# Patient Record
Sex: Male | Born: 1980 | Race: Black or African American | Hispanic: No | Marital: Married | Smoking: Current every day smoker
Health system: Southern US, Community
[De-identification: ages and names within clinical notes are randomized; demographics above are authoritative.]

## PROBLEM LIST (undated history)

## (undated) ENCOUNTER — Ambulatory Visit: Admission: EM | Source: Home / Self Care

## (undated) DIAGNOSIS — F419 Anxiety disorder, unspecified: Secondary | ICD-10-CM

## (undated) DIAGNOSIS — I10 Essential (primary) hypertension: Secondary | ICD-10-CM

## (undated) DIAGNOSIS — F4310 Post-traumatic stress disorder, unspecified: Secondary | ICD-10-CM

## (undated) DIAGNOSIS — D649 Anemia, unspecified: Secondary | ICD-10-CM

## (undated) DIAGNOSIS — F329 Major depressive disorder, single episode, unspecified: Secondary | ICD-10-CM

## (undated) DIAGNOSIS — M1990 Unspecified osteoarthritis, unspecified site: Secondary | ICD-10-CM

## (undated) DIAGNOSIS — K769 Liver disease, unspecified: Secondary | ICD-10-CM

## (undated) DIAGNOSIS — F32A Depression, unspecified: Secondary | ICD-10-CM

## (undated) HISTORY — DX: Anemia, unspecified: D64.9

## (undated) HISTORY — DX: Unspecified osteoarthritis, unspecified site: M19.90

## (undated) HISTORY — PX: WISDOM TOOTH EXTRACTION: SHX21

## (undated) HISTORY — DX: Essential (primary) hypertension: I10

---
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
## 2009-12-01 HISTORY — PX: ANKLE SURGERY: SHX546

## 2011-12-02 HISTORY — PX: SPINE SURGERY: SHX786

## 2013-12-01 DIAGNOSIS — I219 Acute myocardial infarction, unspecified: Secondary | ICD-10-CM

## 2013-12-01 HISTORY — DX: Acute myocardial infarction, unspecified: I21.9

## 2017-12-01 DIAGNOSIS — K5190 Ulcerative colitis, unspecified, without complications: Secondary | ICD-10-CM

## 2017-12-01 HISTORY — DX: Ulcerative colitis, unspecified, without complications: K51.90

## 2018-09-14 DIAGNOSIS — M79605 Pain in left leg: Secondary | ICD-10-CM | POA: Diagnosis not present

## 2018-09-14 DIAGNOSIS — S8392XA Sprain of unspecified site of left knee, initial encounter: Secondary | ICD-10-CM | POA: Diagnosis not present

## 2018-09-17 DIAGNOSIS — M25562 Pain in left knee: Secondary | ICD-10-CM | POA: Diagnosis not present

## 2018-09-20 DIAGNOSIS — S83232A Complex tear of medial meniscus, current injury, left knee, initial encounter: Secondary | ICD-10-CM | POA: Diagnosis not present

## 2018-10-08 DIAGNOSIS — S83242A Other tear of medial meniscus, current injury, left knee, initial encounter: Secondary | ICD-10-CM | POA: Diagnosis not present

## 2018-12-01 HISTORY — PX: KNEE SURGERY: SHX244

## 2018-12-20 DIAGNOSIS — S83242A Other tear of medial meniscus, current injury, left knee, initial encounter: Secondary | ICD-10-CM | POA: Diagnosis not present

## 2018-12-20 DIAGNOSIS — M948X6 Other specified disorders of cartilage, lower leg: Secondary | ICD-10-CM | POA: Diagnosis not present

## 2018-12-20 DIAGNOSIS — M94262 Chondromalacia, left knee: Secondary | ICD-10-CM | POA: Diagnosis not present

## 2019-01-24 DIAGNOSIS — S83242D Other tear of medial meniscus, current injury, left knee, subsequent encounter: Secondary | ICD-10-CM | POA: Diagnosis not present

## 2019-01-24 DIAGNOSIS — M25562 Pain in left knee: Secondary | ICD-10-CM | POA: Diagnosis not present

## 2019-01-24 DIAGNOSIS — M6281 Muscle weakness (generalized): Secondary | ICD-10-CM | POA: Diagnosis not present

## 2019-01-24 DIAGNOSIS — R269 Unspecified abnormalities of gait and mobility: Secondary | ICD-10-CM | POA: Diagnosis not present

## 2019-02-02 DIAGNOSIS — M25562 Pain in left knee: Secondary | ICD-10-CM | POA: Diagnosis not present

## 2019-09-09 DIAGNOSIS — M7051 Other bursitis of knee, right knee: Secondary | ICD-10-CM | POA: Diagnosis not present

## 2019-09-09 DIAGNOSIS — S6721XA Crushing injury of right hand, initial encounter: Secondary | ICD-10-CM | POA: Diagnosis not present

## 2019-11-21 DIAGNOSIS — M25551 Pain in right hip: Secondary | ICD-10-CM | POA: Diagnosis not present

## 2019-11-21 DIAGNOSIS — M5416 Radiculopathy, lumbar region: Secondary | ICD-10-CM | POA: Diagnosis not present

## 2019-11-21 DIAGNOSIS — F17200 Nicotine dependence, unspecified, uncomplicated: Secondary | ICD-10-CM | POA: Diagnosis not present

## 2019-11-21 DIAGNOSIS — I252 Old myocardial infarction: Secondary | ICD-10-CM | POA: Diagnosis not present

## 2019-11-21 DIAGNOSIS — M48061 Spinal stenosis, lumbar region without neurogenic claudication: Secondary | ICD-10-CM | POA: Diagnosis not present

## 2019-11-28 DIAGNOSIS — Z6832 Body mass index (BMI) 32.0-32.9, adult: Secondary | ICD-10-CM | POA: Diagnosis not present

## 2019-11-28 DIAGNOSIS — R112 Nausea with vomiting, unspecified: Secondary | ICD-10-CM | POA: Diagnosis not present

## 2019-11-28 DIAGNOSIS — R197 Diarrhea, unspecified: Secondary | ICD-10-CM | POA: Diagnosis not present

## 2019-11-28 DIAGNOSIS — K529 Noninfective gastroenteritis and colitis, unspecified: Secondary | ICD-10-CM | POA: Diagnosis not present

## 2020-04-13 ENCOUNTER — Emergency Department (HOSPITAL_COMMUNITY)
Admission: EM | Admit: 2020-04-13 | Discharge: 2020-04-13 | Disposition: A | Discharge status: Home / Self Care | Payer: BC Managed Care – PPO | Attending: Emergency Medicine | Admitting: Emergency Medicine

## 2020-04-13 ENCOUNTER — Other Ambulatory Visit: Payer: Self-pay

## 2020-04-13 ENCOUNTER — Encounter (HOSPITAL_COMMUNITY): Payer: Self-pay | Admitting: Emergency Medicine

## 2020-04-13 DIAGNOSIS — Z5321 Procedure and treatment not carried out due to patient leaving prior to being seen by health care provider: Secondary | ICD-10-CM | POA: Insufficient documentation

## 2020-04-13 DIAGNOSIS — R0789 Other chest pain: Secondary | ICD-10-CM | POA: Diagnosis not present

## 2020-04-13 DIAGNOSIS — R079 Chest pain, unspecified: Secondary | ICD-10-CM | POA: Diagnosis not present

## 2020-04-13 DIAGNOSIS — R064 Hyperventilation: Secondary | ICD-10-CM | POA: Diagnosis not present

## 2020-04-13 DIAGNOSIS — F419 Anxiety disorder, unspecified: Secondary | ICD-10-CM | POA: Insufficient documentation

## 2020-04-13 DIAGNOSIS — R0689 Other abnormalities of breathing: Secondary | ICD-10-CM | POA: Diagnosis not present

## 2020-04-13 HISTORY — DX: Anxiety disorder, unspecified: F41.9

## 2020-04-13 HISTORY — DX: Post-traumatic stress disorder, unspecified: F43.10

## 2020-04-13 HISTORY — DX: Depression, unspecified: F32.A

## 2020-04-13 NOTE — ED Triage Notes (Addendum)
Patient arrived with EMS from home reports anxiety attack this morning , agitated /hyperventilating when EMS arrived at his home , he received Haldol 5 mg IM by EMS with relief . Patient stated he has not taken his antianxiety medication for several months .

## 2020-04-13 NOTE — ED Notes (Signed)
Called pt name x4 for VS, no response from pt.

## 2020-05-30 ENCOUNTER — Ambulatory Visit: Payer: BC Managed Care – PPO | Admitting: Physician Assistant

## 2020-05-30 ENCOUNTER — Encounter: Payer: Self-pay | Admitting: Physician Assistant

## 2020-05-30 ENCOUNTER — Other Ambulatory Visit: Payer: Self-pay

## 2020-05-30 VITALS — BP 130/80 | HR 67 | Temp 98.4°F | Ht 68.5 in | Wt 219.0 lb

## 2020-05-30 DIAGNOSIS — M25562 Pain in left knee: Secondary | ICD-10-CM

## 2020-05-30 DIAGNOSIS — I10 Essential (primary) hypertension: Secondary | ICD-10-CM | POA: Insufficient documentation

## 2020-05-30 DIAGNOSIS — G8929 Other chronic pain: Secondary | ICD-10-CM

## 2020-05-30 DIAGNOSIS — M25561 Pain in right knee: Secondary | ICD-10-CM

## 2020-05-30 DIAGNOSIS — F331 Major depressive disorder, recurrent, moderate: Secondary | ICD-10-CM

## 2020-05-30 DIAGNOSIS — K51911 Ulcerative colitis, unspecified with rectal bleeding: Secondary | ICD-10-CM

## 2020-05-30 DIAGNOSIS — F419 Anxiety disorder, unspecified: Secondary | ICD-10-CM

## 2020-05-30 DIAGNOSIS — G629 Polyneuropathy, unspecified: Secondary | ICD-10-CM

## 2020-05-30 DIAGNOSIS — I219 Acute myocardial infarction, unspecified: Secondary | ICD-10-CM

## 2020-05-30 DIAGNOSIS — F32A Depression, unspecified: Secondary | ICD-10-CM | POA: Insufficient documentation

## 2020-05-30 DIAGNOSIS — F514 Sleep terrors [night terrors]: Secondary | ICD-10-CM

## 2020-05-30 DIAGNOSIS — F4310 Post-traumatic stress disorder, unspecified: Secondary | ICD-10-CM | POA: Insufficient documentation

## 2020-05-30 MED ORDER — DIAZEPAM 5 MG PO TABS: 5.0000 mg | ORAL_TABLET | Freq: Every evening | ORAL | 2 refills | Status: DC

## 2020-05-30 MED ORDER — CELECOXIB 200 MG PO CAPS: 200.0000 mg | ORAL_CAPSULE | Freq: Two times a day (BID) | ORAL | 2 refills | Status: AC

## 2020-05-30 MED ORDER — MESALAMINE ER 0.375 G PO CP24: 375.0000 mg | ORAL_CAPSULE | Freq: Four times a day (QID) | ORAL | 3 refills | Status: DC

## 2020-05-30 MED ORDER — GABAPENTIN 300 MG PO CAPS: 300.0000 mg | ORAL_CAPSULE | Freq: Three times a day (TID) | ORAL | 2 refills | Status: DC

## 2020-05-30 MED ORDER — VORTIOXETINE HBR 10 MG PO TABS: 5.0000 mg | ORAL_TABLET | Freq: Every day | ORAL | 0 refills | Status: DC

## 2020-05-30 MED ORDER — PRAZOSIN HCL 1 MG PO CAPS: 2.0000 mg | ORAL_CAPSULE | Freq: Every day | ORAL | 1 refills | Status: DC

## 2020-05-30 MED ORDER — VORTIOXETINE HBR 5 MG PO TABS: 5.0000 mg | ORAL_TABLET | Freq: Every day | ORAL | 0 refills | Status: DC

## 2020-05-30 NOTE — Patient Instructions (Signed)
It was great to see you!  Let's start Trintellix -- sample has been placed. I have the psychiatry referral in, however if you'd like to take Viibryd instead, please let me know and I will send that in while you wait for your appointment.  Medications have been refilled.  You will be contacted about your referrals to: Gastroenterology Cardiology Psychiatry Sports Medicine  Let's follow-up in 3 months if you will still be getting Valium from me (can be in 6 months if you are seeing psych), sooner if you have concerns.  Take care,  Inda Coke PA-C

## 2020-05-30 NOTE — Progress Notes (Signed)
Daniel Marsh is a 39 y.o. male is here to establish care.  I acted as a Education administrator for [employer], PA-C Anselmo Pickler, LPN   History of Present Illness:   Chief Complaint  Patient presents with  . Establish Care  . Knee Pain    HPI   Pt here to establish care today. He and his wife recently moved from [place], [place].  Knee pain Pt having bilateral knee pain. R > L., right knee is buckling and feels like it is sliding out of place. Pt not taken anything for pain. Was supposed to have surgery but he states that while he was on the operating table, the surgeon decided to not proceed with surgery due to the anatomy of his knees. States he was going to have his meniscus repaired. Has had two rounds of steroid injections. He takes celebrex 200 mg BID for pain.  MI; HTN Was in a very stressful lifestyle and had MI. Went to cardiology for a few years afterwards. Was on a beta blocker (?rx or dose) and micardis-hctz (?dose) but he took himself off of these. He is interested in resuming care with a cardiologist. Patient denies chest pain, SOB, blurred vision, dizziness, unusual headaches, lower leg swelling.   BP Readings from Last 3 Encounters:  05/30/20 130/80  04/13/20 140/78   Ulcerative colitis Last colonoscopy in 2020? Most recent severe flare in 2019 - had bloody diarrhea that took him to the ER. He reports that he takes mesalamine 0.375 mg QID. He would like to establish with a gastroenterologist.  Anxiety and Depression; PTSD; Night Terrors Significant history of this. Was seeing a psychiatrist prior to moving. He contineus to see a therapist from the [employer]. He is currently prescribed Viibryd 40 mg daily, Minipress 2 mg nightly, Valium 5 mg daily. He has been out of his Viibryd for a while. He states that he has tried multiple medications including: cymbalta, lexapro, zoloft, and several others. He has not tried Effexor or Trintellix but has family members that have done  well with these medications. He is currently not suicidal. Viibrid has worked well for him but can be numbing and has decreased his libido. Takes minipress 2-3 times a week for night terrors.  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 He is prescribed Gabapentin 300 mg TID for this. He tolerates this without issue.   Health Maintenance Due  Topic Date Due  . Hepatitis C Screening  Never done  . COVID-19 Vaccine (1) Never done  . HIV Screening  Never done    Past Medical History:  Diagnosis Date  . Anxiety   . Arthritis   . Depression   . Hypertension   . Myocardial infarction ([place]) 2015  . PTSD (post-traumatic stress disorder)   . Ulcerative colitis ([place]) 2019     Social History   Tobacco Use  . Smoking status: Current Every Day Smoker    Types: Cigarettes, Cigars  . Smokeless tobacco: Never Used  Vaping Use  . Vaping Use: Never used  Substance Use Topics  . Alcohol use: Yes    Alcohol/week: 13.0 standard drinks    Types: 10 Cans of beer, 3 Shots of liquor per week  . Drug use: Yes    Types: Marijuana    Past Surgical History:  Procedure Laterality Date  . ANKLE SURGERY Right 2011  . KNEE SURGERY Left 2020  . SPINE SURGERY  2013   Lumbar fusion  L4-S1    Family History  Problem Relation Age of Onset  . Osteoarthritis Mother   . Depression Mother   . Heart attack Mother   . Osteoarthritis Father   . Depression Father   . Depression Brother   . Diabetes Maternal Grandmother   . Hyperlipidemia Maternal Grandmother   . Hypertension Maternal Grandmother   . Heart attack Maternal Grandmother   . Osteoarthritis Maternal Grandfather   . COPD Maternal Grandfather   . Kidney disease Maternal Grandfather   . Renal cancer Maternal Grandfather   . Alcohol abuse Paternal Grandmother   . COPD Paternal Grandmother   . Diabetes Paternal Grandmother   . Hypertension Paternal Grandmother   . Lung cancer Paternal Grandmother    . Osteoarthritis Paternal Grandfather   . Heart attack Paternal Grandfather   . Heart failure Paternal Grandfather   . Depression Brother   . Pancreatic cancer Maternal Aunt   . Lung cancer Paternal Aunt     PMHx, SurgHx, SocialHx, FamHx, Medications, and Allergies were reviewed in the Visit Navigator and updated as appropriate.   Patient Active Problem List   Diagnosis Date Noted  . PTSD (post-traumatic stress disorder)   . Hypertension   . Depression   . Anxiety   . Ulcerative colitis ([place]) 2019  . Myocardial infarction [facility]) 2015    Social History   Tobacco Use  . Smoking status: Current Every Day Smoker    Types: Cigarettes, Cigars  . Smokeless tobacco: Never Used  Vaping Use  . Vaping Use: Never used  Substance Use Topics  . Alcohol use: Yes    Alcohol/week: 13.0 standard drinks    Types: 10 Cans of beer, 3 Shots of liquor per week  . Drug use: Yes    Types: Marijuana    Current Medications and Allergies:    Current Outpatient Medications:  .  celecoxib (CELEBREX) 200 MG capsule, Take 1 capsule (200 mg total) by mouth 2 (two) times daily., Disp: 180 capsule, Rfl: 2 .  diazepam (VALIUM) 5 MG tablet, Take 1 tablet (5 mg total) by mouth at bedtime., Disp: 30 tablet, Rfl: 2 .  gabapentin (NEURONTIN) 300 MG capsule, Take 1 capsule (300 mg total) by mouth 3 (three) times daily., Disp: 270 capsule, Rfl: 2 .  mesalamine (APRISO) 0.375 g 24 hr capsule, Take 1 capsule (0.375 g total) by mouth in the morning, at noon, in the evening, and at bedtime., Disp: 360 capsule, Rfl: 3 .  prazosin (MINIPRESS) 1 MG capsule, Take 2 capsules (2 mg total) by mouth at bedtime., Disp: 180 capsule, Rfl: 1 .  Vilazodone HCl (VIIBRYD) 40 MG TABS, Take by mouth., Disp: , Rfl:  .  vortioxetine HBr (TRINTELLIX) 10 MG TABS tablet, Take 0.5 tablets (5 mg total) by mouth daily., Disp: 7 tablet, Rfl: 0 .  vortioxetine HBr (TRINTELLIX) 5 MG TABS tablet, Take 1 tablet (5 mg total) by mouth daily.,  Disp: 14 tablet, Rfl: 0  No Known Allergies  Review of Systems   ROS  Negative unless otherwise specified per HPI.  Vitals:   Vitals:   05/30/20 1357  BP: 130/80  Pulse: 67  Temp: 98.4 F (36.9 C)  TempSrc: Temporal  SpO2: 98%  Weight: 219 lb (99.3 kg)  Height: 5' 8.5" (1.74 m)     Body mass index is 32.81 kg/m.   Physical Exam:    Physical Exam Vitals and nursing note reviewed.  Constitutional:      General: He is not in  acute distress.    Appearance: He is well-developed. He is not ill-appearing or toxic-appearing.  Cardiovascular:     Rate and Rhythm: Normal rate and regular rhythm.     Pulses: Normal pulses.     Heart sounds: Normal heart sounds, S1 normal and S2 normal.     Comments: No LE edema Pulmonary:     Effort: Pulmonary effort is normal.     Breath sounds: Normal breath sounds.  Musculoskeletal:     Comments: R knee -- TTP to lateral R knee L knee -- swelling to medial lower L knee  Skin:    General: Skin is warm and dry.  Neurological:     Mental Status: He is alert.     GCS: GCS eye subscore is 4. GCS verbal subscore is 5. GCS motor subscore is 6.  Psychiatric:        Speech: Speech normal.        Behavior: Behavior normal. Behavior is cooperative.      Assessment and Plan:    Daniel was seen today for establish care and knee pain.  Diagnoses and all orders for this visit:  Chronic pain of both knees Referral to sports medicine.  Myocardial infarction, unspecified MI type, unspecified artery ([place]);  Essential hypertension BP currently controlled and patient is currently asymptomatic. Will refer to cardiology given hx of MI and need for routine care.  Ulcerative colitis with rectal bleeding, unspecified location [facility]) Referral to GI. Did provide refill of his mesalamine today.  PTSD (post-traumatic stress disorder); Moderate episode of recurrent major depressive disorder ([place]); Anxiety; Night terrors Uncontrolled. I discussed  with patient that if they develop any SI, to tell someone immediately and seek medical attention. We decided to continue to hold Viibryd and start 5 mg Trintellix. This was given to him in the office today -- provided 1 months worth. Will refer to psychiatry for further management. Did discuss that if he would like to go back on Viibryd instead prior to psychiatry appointment I would be happy to switch him back to this.   I have also given him courtesy refills of Minipress and Valium. [place] Controlled Substance Database reviewed today regarding patient. Patient is compliant with [place] regarding pharmacy use and one-prescribing provider. It is appropriate to continue current medication regimen. Discussed pros and cons of controlled substances and expectations to receive prescription from our office. Patient is not to abuse, misuse, divert or use medication other than as prescribed. Patient is not to seek controlled substances from other clinics or multiple pharmacies. Patient is not to use illegal drugs. Discussed recommended short term use of med. Discussed risks of medication including dependence, tolerance, and addiction/abuse potential. Patient will establish or update controlled substance agreement as appropriate.  Neuropathy Well controlled. Refill celebrex and Neurontin.  Other orders -     celecoxib (CELEBREX) 200 MG capsule; Take 1 capsule (200 mg total) by mouth 2 (two) times daily. -     diazepam (VALIUM) 5 MG tablet; Take 1 tablet (5 mg total) by mouth at bedtime. -     gabapentin (NEURONTIN) 300 MG capsule; Take 1 capsule (300 mg total) by mouth 3 (three) times daily. -     mesalamine (APRISO) 0.375 g 24 hr capsule; Take 1 capsule (0.375 g total) by mouth in the morning, at noon, in the evening, and at bedtime. -     prazosin (MINIPRESS) 1 MG capsule; Take 2 capsules (2 mg total) by mouth at bedtime. -  vortioxetine HBr (TRINTELLIX) 5 MG TABS tablet; Take 1 tablet (5 mg total) by mouth  daily. -     vortioxetine HBr (TRINTELLIX) 10 MG TABS tablet; Take 0.5 tablets (5 mg total) by mouth daily.  Reviewed expectations re: course of current medical issues. Discussed self-management of symptoms. Outlined signs and symptoms indicating need for more acute intervention. Patient verbalized understanding and all questions were answered. See orders for this visit as documented in the electronic medical record. Patient received an After Visit Summary.  CMA or LPN served as scribe during this visit. History, Physical, and Plan performed by medical provider. The above documentation has been reviewed and is accurate and complete.  I spent >45 minutes with this patient, greater than 50% was face-to-face time counseling regarding the above diagnoses.   Inda Coke, PA-C [place] [place] 05/30/2020  Follow-up: No follow-ups on file.

## 2020-06-01 ENCOUNTER — Encounter: Payer: Self-pay | Admitting: Cardiovascular Disease

## 2020-06-01 ENCOUNTER — Ambulatory Visit: Payer: BC Managed Care – PPO | Admitting: Physician Assistant

## 2020-06-01 ENCOUNTER — Other Ambulatory Visit: Payer: Self-pay

## 2020-06-01 ENCOUNTER — Ambulatory Visit: Payer: BC Managed Care – PPO | Admitting: Cardiovascular Disease

## 2020-06-01 VITALS — BP 160/92 | HR 72 | Ht 71.5 in | Wt 214.0 lb

## 2020-06-01 DIAGNOSIS — I10 Essential (primary) hypertension: Secondary | ICD-10-CM | POA: Diagnosis not present

## 2020-06-01 DIAGNOSIS — E782 Mixed hyperlipidemia: Secondary | ICD-10-CM

## 2020-06-01 DIAGNOSIS — E785 Hyperlipidemia, unspecified: Secondary | ICD-10-CM | POA: Insufficient documentation

## 2020-06-01 DIAGNOSIS — R072 Precordial pain: Secondary | ICD-10-CM | POA: Diagnosis not present

## 2020-06-01 DIAGNOSIS — I219 Acute myocardial infarction, unspecified: Secondary | ICD-10-CM

## 2020-06-01 LAB — LIPID PANEL
Chol/HDL Ratio: 2.5 ratio (ref 0.0–5.0)
Cholesterol, Total: 211 mg/dL — ABNORMAL HIGH (ref 100–199)
HDL: 85 mg/dL (ref >39)
LDL Chol Calc (NIH): 114 mg/dL — ABNORMAL HIGH (ref 0–99)
Triglycerides: 66 mg/dL (ref 0–149)
VLDL Cholesterol Cal: 12 mg/dL (ref 5–40)

## 2020-06-01 LAB — HEPATIC FUNCTION PANEL
ALT: 11 IU/L (ref 0–44)
AST: 18 IU/L (ref 0–40)
Albumin: 4.8 g/dL (ref 4.0–5.0)
Alkaline Phosphatase: 63 IU/L (ref 48–121)
Bilirubin Total: 0.8 mg/dL (ref 0.0–1.2)
Bilirubin, Direct: 0.22 mg/dL (ref 0.00–0.40)
Total Protein: 7 g/dL (ref 6.0–8.5)

## 2020-06-01 MED ORDER — METOPROLOL TARTRATE 100 MG PO TABS
ORAL_TABLET | ORAL | 0 refills | Status: DC
Start: 2020-06-01 — End: 2020-07-09

## 2020-06-01 NOTE — Assessment & Plan Note (Addendum)
History of essential hypertension in the past on Micardis currently not on any antihypertensive medications.  His blood pressure in [facility] office 3 days ago was 130/80.  Today it is 160/92 initially which falling to 136/86 at the end of the interview.

## 2020-06-01 NOTE — Assessment & Plan Note (Signed)
History of hyperlipidemia on simvastatin in the past not currently taking that medication.  We will check a lipid liver profile today.

## 2020-06-01 NOTE — Patient Instructions (Signed)
Medication Instructions:  NO CHANGE *If you need a refill on your cardiac medications before your next appointment, please call your pharmacy*   Lab Work: Your physician recommends that you HAVE LAB WORK TODAY If you have labs (blood work) drawn today and your tests are completely normal, you will receive your results only by:  [place] (if you have MyChart) OR  A paper copy in the mail If you have any lab test that is abnormal or we need to change your treatment, we will call you to review the results.   Testing/Procedures:  Your physician has requested that you have an echocardiogram. Echocardiography is a painless test that uses sound waves to create images of your heart. It provides your doctor with information about the size and shape of your heart and how well your hearts chambers and valves are working. This procedure takes approximately one hour. There are no restrictions for this procedure.[place]  Your cardiac CT will be scheduled at one of the below locations:   [facility] [address] [place], [place] [address] [phone]  If scheduled at [facility], please arrive at the [facility] main entrance of [facility] 30 minutes prior to test start time. Proceed to the [facility] Radiology Department (first floor) to check-in and test prep.  Please follow these instructions carefully (unless otherwise directed):  Hold all erectile dysfunction medications at least 3 days (72 hrs) prior to test.  On the Night Before the Test:  Be sure to Drink plenty of water.  Do not consume any caffeinated/decaffeinated beverages or chocolate 12 hours prior to your test.  Do not take any antihistamines 12 hours prior to your test.  On the Day of the Test:  Drink plenty of water. Do not drink any water within one hour of the test.  Do not eat any food 4 hours prior to the test.  You may take your regular medications prior  to the test.   Take metoprolol (Lopressor) 100 MG two hours prior to test.  HOLD Furosemide/Hydrochlorothiazide morning of the test.  After the Test:  Drink plenty of water.  After receiving IV contrast, you may experience a mild flushed feeling. This is normal.  On occasion, you may experience a mild rash up to 24 hours after the test. This is not dangerous. If this occurs, you can take Benadryl 25 mg and increase your fluid intake.  If you experience trouble breathing, this can be serious. If it is severe call 911 IMMEDIATELY. If it is mild, please call our office.  If you take any of these medications: Glipizide/Metformin, Avandament, Glucavance, please do not take 48 hours after completing test unless otherwise instructed.   Once we have confirmed authorization from your insurance company, we will call you to set up a date and time for your test. Based on how quickly your insurance processes prior authorizations requests, please allow up to 4 weeks to be contacted for scheduling your Cardiac CT appointment. Be advised that routine Cardiac CT appointments could be scheduled as many as 8 weeks after your provider has ordered it.  For non-scheduling related questions, please contact the cardiac imaging nurse navigator should you have any questions/concerns: Marchia Bond, Cardiac Imaging Nurse Navigator Burley Saver, Interim Cardiac Imaging Nurse [place] [facility] Direct Office Dial: [phone]   For scheduling needs, including cancellations and rescheduling, please call Vivien Rota at [phone], option 3.    Follow-Up: At  [facility], you and your health needs are our priority.  As part of our continuing mission to provide you with exceptional heart care, we have created designated Provider Care Teams.  These Care Teams include your primary Cardiologist (physician) and Advanced Practice Providers (APPs -  Physician Assistants and Nurse Practitioners) who  all work together to provide you with the care you need, when you need it.  We recommend signing up for the patient portal called "MyChart".  Sign up information is provided on this After Visit Summary.  MyChart is used to connect with patients for Virtual Visits (Telemedicine).  Patients are able to view lab/test results, encounter notes, upcoming appointments, etc.  Non-urgent messages can be sent to your provider as well.   To learn more about what you can do with MyChart, go to [email or web address].    Your next appointment:    Your physician recommends that you schedule a follow-up appointment in: WITH DR BERRY AFTER TESTING COMPLETE

## 2020-06-01 NOTE — Assessment & Plan Note (Signed)
Questionable history of myocardial infarction in 2015 while he was incarcerated in prison.  He was taken to [facility] for several days.  He does not remember whether he had a cardiac catheterization or not.  He was under a lot of stress at that time.  Recently he is noticed some chest pressure.  I am going to obtain old records, order a coronary CTA and 2D echo.

## 2020-06-01 NOTE — Progress Notes (Signed)
.    06/01/2020 Daniel Bevans   07/27/1981  [id number]  Primary Physician Inda Coke, PA Primary Cardiologist: Lorretta Harp MD Lupe Carney, [place]  HPI:  Daniel Marsh is a 39 y.o. moderately overweight married African-American male father of 3 children who is accompanied by his wife Daniel Marsh and one of his children today.  He was referred by Inda Coke , PA, for evaluation of atypical chest pain.  He currently does not work because of PTSD.  He was in the [employer] and was incarcerated there as well.  He continues to smoke 1 to 3 cigarettes a day.  He did have treated hypertension hyperlipidemia in the past but no longer is on medications for these.  There is no family history for heart disease.  He did have a "myocardial infarction" in 2015 when he was in the [employer] and incarcerated.  He was taken to [facility] and was there for several days.  He is not aware that he had a heart catheterization.  He has had some chest discomfort recently and was referred here.  He exercises by doing sit ups and walking.  He has lost over 50 pounds in the last 6 months due to dietary changes and exercise.   Current Meds  Medication Sig  . celecoxib (CELEBREX) 200 MG capsule Take 1 capsule (200 mg total) by mouth 2 (two) times daily.  . diazepam (VALIUM) 5 MG tablet Take 1 tablet (5 mg total) by mouth at bedtime.  . gabapentin (NEURONTIN) 300 MG capsule Take 1 capsule (300 mg total) by mouth 3 (three) times daily.  . mesalamine (APRISO) 0.375 g 24 hr capsule Take 1 capsule (0.375 g total) by mouth in the morning, at noon, in the evening, and at bedtime.  . prazosin (MINIPRESS) 1 MG capsule Take 2 capsules (2 mg total) by mouth at bedtime.  . vortioxetine HBr (TRINTELLIX) 10 MG TABS tablet Take 0.5 tablets (5 mg total) by mouth daily.  [name] vortioxetine HBr (TRINTELLIX) 5 MG TABS tablet Take 1 tablet (5 mg total) by mouth daily.  . [DISCONTINUED] Vilazodone HCl  (VIIBRYD) 40 MG TABS Take by mouth.     No Known Allergies  Social History   Socioeconomic History  . Marital status: Married    Spouse name: Not on file  . Number of children: Not on file  . Years of education: Not on file  . Highest education level: Not on file  Occupational History  . Not on file  Tobacco Use  . Smoking status: Current Every Day Smoker    Types: Cigarettes, Cigars  . Smokeless tobacco: Never Used  Vaping Use  . Vaping Use: Never used  Substance and Sexual Activity  . Alcohol use: Yes    Alcohol/week: 13.0 standard drinks    Types: 10 Cans of beer, 3 Shots of liquor per week  . Drug use: Yes    Types: Marijuana  . Sexual activity: Yes  Other Topics Concern  . Not on file  Social History Narrative   Was in the [employer] for 7 years, required lumbar surgery while in the [employer] and had poor outcome   Divorced and re-married   44 yo son   Social Determinants of Radio broadcast assistant Strain:   . Difficulty of Paying Living Expenses:   Food Insecurity:   . Worried About Charity fundraiser in the Last Year:   . Arboriculturist in the Last Year:   [employer]  Needs:   . Lack of Transportation (Medical):   [name] Lack of Transportation (Non-Medical):   Physical Activity:   . Days of Exercise per Week:   . Minutes of Exercise per Session:   Stress:   . Feeling of Stress :   Social Connections:   . Frequency of Communication with Friends and Family:   . Frequency of Social Gatherings with Friends and Family:   . Attends Religious Services:   . Active Member of Clubs or Organizations:   . Attends Archivist Meetings:   [name] Marital Status:   Intimate Partner Violence:   . Fear of Current or Ex-Partner:   . Emotionally Abused:   [name] Physically Abused:   . Sexually Abused:      Review of Systems: General: negative for chills, fever, night sweats or weight changes.  Cardiovascular: negative for chest pain, dyspnea on exertion, edema,  orthopnea, palpitations, paroxysmal nocturnal dyspnea or shortness of breath Dermatological: negative for rash Respiratory: negative for cough or wheezing Urologic: negative for hematuria Abdominal: negative for nausea, vomiting, diarrhea, bright red blood per rectum, melena, or hematemesis Neurologic: negative for visual changes, syncope, or dizziness All other systems reviewed and are otherwise negative except as noted above.    Blood pressure (!) 160/92, pulse 72, height 5' 11.5" (1.816 m), weight 214 lb (97.1 kg), SpO2 97 %.  General appearance: alert and no distress Neck: no adenopathy, no carotid bruit, no JVD, supple, symmetrical, trachea midline and thyroid not enlarged, symmetric, no tenderness/mass/nodules Lungs: clear to auscultation bilaterally Heart: regular rate and rhythm, S1, S2 normal, no murmur, click, rub or gallop Extremities: extremities normal, atraumatic, no cyanosis or edema Pulses: 2+ and symmetric Skin: Skin color, texture, turgor normal. No rashes or lesions Neurologic: Alert and oriented X 3, normal strength and tone. Normal symmetric reflexes. Normal coordination and gait  EKG sinus rhythm at 72 with poor R wave progression.  I personally reviewed this EKG.  ASSESSMENT AND PLAN:   Hyperlipidemia History of hyperlipidemia on simvastatin in the past not currently taking that medication.  We will check a lipid liver profile today.  Hypertension History of essential hypertension in the past on Micardis currently not on any antihypertensive medications.  His blood pressure in [facility] office 3 days ago was 130/80.  Today it is 160/92.  Myocardial infarction [facility]) Questionable history of myocardial infarction in 2015 while he was incarcerated in prison.  He was taken to [facility] for several days.  He does not remember whether he had a cardiac catheterization or not.  He was under a lot of stress at that time.  Recently he is noticed  some chest pressure.  I am going to obtain old records, order a coronary CTA and 2D echo.      Lorretta Harp MD FACP,FACC,FAHA, [facility] 06/01/2020 10:03 AM

## 2020-06-05 ENCOUNTER — Ambulatory Visit: Payer: Self-pay

## 2020-06-05 ENCOUNTER — Other Ambulatory Visit: Payer: Self-pay

## 2020-06-05 ENCOUNTER — Ambulatory Visit (INDEPENDENT_AMBULATORY_CARE_PROVIDER_SITE_OTHER): Payer: BC Managed Care – PPO

## 2020-06-05 ENCOUNTER — Encounter: Payer: Self-pay | Admitting: Family Medicine

## 2020-06-05 ENCOUNTER — Ambulatory Visit: Payer: BC Managed Care – PPO | Admitting: Family Medicine

## 2020-06-05 VITALS — BP 108/78 | HR 78 | Ht 71.5 in | Wt 219.8 lb

## 2020-06-05 DIAGNOSIS — G8929 Other chronic pain: Secondary | ICD-10-CM | POA: Diagnosis not present

## 2020-06-05 DIAGNOSIS — M25561 Pain in right knee: Secondary | ICD-10-CM | POA: Diagnosis not present

## 2020-06-05 DIAGNOSIS — M25562 Pain in left knee: Secondary | ICD-10-CM | POA: Diagnosis not present

## 2020-06-05 DIAGNOSIS — M25461 Effusion, right knee: Secondary | ICD-10-CM | POA: Diagnosis not present

## 2020-06-05 NOTE — Patient Instructions (Signed)
Thank you for coming in today.  Plan for xray today.  Use voltaren gel up to 4x daily.  If not better let me know and I will arrange for an MRI.  Keep me updated.  I am here for you.    Anterior Cruciate Ligament Tear  Ligaments are strong bands of tissue that connect bones to each other. The anterior cruciate ligament (ACL) connects your shin bone to your thigh bone. A tear in this ligament can cause pain and make it hard for you to use your leg to support your body weight. There are three types of ACL injuries:  Grade 1. In this type, the ligament is stretched.  Grade 2. In this type, the ligament is partially torn.  Grade 3. In this type, the ligament is completely torn. What are the causes? This condition happens when too much pressure is put on the ACL. This condition may be caused by:  Twisting your knee, especially with your foot planted.  Making a quick change in direction (cut and pivot).  Slowing down quickly while running.  Landing a jump without bending your knee.  Forcefully straightening (hyperextending)your knee more than normal.  Getting hit in the knee.  Hitting your knee on the ground. What increases the risk? You are more likely to develop this condition if:  You are a woman.  You play sports that involve jumping or changing directions often. These include: ? Football. ? Basketball. ? Soccer. ? Volleyball. ? Skiing. ? Hockey. ? Gymnastics. What are the signs or symptoms? Symptoms of this condition include:  Feeling or hearing a popping at the time of injury.  A feeling that your knee is giving way at the time of injury.  Pain.  Swelling.  Tenderness.  Instability when you try to put weight on your injured leg.  Inability to move your knee as far as normal.  Difficulty walking. How is this diagnosed? This condition may be diagnosed based on:  Your symptoms.  Your medical history.  A physical exam. During your physical exam,  your provider will test your knee to see if it moves more than it should (has laxity).  Imaging tests, such as: ? An X-ray. This may be done to check for bone injuries. ? MRI. This may be done to see if the tear is partial or complete and to check for additional injuries. How is this treated? This condition may be treated by:  Resting your knee.  Avoiding activities that cause pain, instability, or swelling.  Raising (elevating) your knee while resting.  Keeping weight off your leg until pain and swelling improve. You may need to use crutches or a walker.  Icing your knee.  Taking medicine to reduce pain and swelling.  Wearing a knee brace.  Wearing a compression bandage or wrap to reduce swelling.  Doing range-of-motion, strengthening, and stretching exercises (physical therapy).  Having surgery. This may be needed if you are very active and have a complete tear. Follow these instructions at home: If you have a brace:  Wear the brace as told by your health care provider. Remove it only as told by your health care provider.  Loosen the brace if your toes tingle, become numb, or turn cold and blue.  Keep the brace clean.  If the brace is not waterproof: ? Do not let it get wet. ? Cover it with a watertight covering when you take a bath or shower. Managing pain, stiffness, and swelling   If directed, put  ice on your knee: ? If you have a removable brace, remove it as told by your health care provider. ? Put ice in a plastic bag. ? Place a towel between your skin and the bag. ? Leave the ice on for 20 minutes, 2-3 times a day.  Move your foot often to reduce stiffness and swelling.  Raise (elevate) your knee above the level of your heart while you are sitting or lying down. Medicines  Take over-the-counter and prescription medicines only as told by your health care provider.  Ask your health care provider if the medicine prescribed to you: ? Requires you to avoid  driving or using heavy machinery. ? Can cause constipation. You may need to take actions to prevent or treat constipation, such as:  Drink enough fluid to keep your urine pale yellow.  Take over-the-counter or prescription medicines.  Eat foods that are high in fiber, such as beans, whole grains, and fresh fruits and vegetables.  Limit foods that are high in fat and processed sugars, such as fried or sweet foods. Activity  Ask your health care provider when it is safe to drive if you have a brace on your leg.  Return to your normal activities as told by your health care provider. Ask your health care provider what activities are safe for you.  Do exercises as told by your health care provider. General instructions  Do not use the injured limb to support your body weight until your health care provider says that you can. Use crutches or a walker as told by your health care provider.  Do not use any products that contain nicotine or tobacco, such as cigarettes, e-cigarettes, and chewing tobacco. These can delay healing. If you need help quitting, ask your health care provider.  Keep all follow-up visits as told by your health care provider. This is important. How is this prevented?  Warm up and stretch before being active.  Cool down and stretch after being active.  Give your body time to rest between periods of activity.  Make sure to use equipment that fits you.  If you wear cleats, make sure they are appropriate for your playing surface.  Be safe and responsible while being active. This will help you avoid falls.  Maintain physical fitness, including: ? Strength. ? Flexibility. ? Cardiovascular fitness. ? Endurance. Contact a health care provider if:  Your symptoms are not improving.  Your symptoms are getting worse. Summary  A tear in the anterior cruciate ligament (ACL) can cause pain and make it hard for you to use your leg to support your body  weight.  Treatment for this condition may include resting, icing, wearing compression wraps or a knee brace, taking medicines, or having surgery.  Do not use the injured limb to support your body weight until your health care provider says that you can. Use crutches or a walker as told by your health care provider.  Contact a health care provider if your symptoms do not improve or get worse.  Keep all follow-up visits as told by your health care provider. This is important. This information is not intended to replace advice given to you by your health care provider. Make sure you discuss any questions you have with your health care provider. Document Revised: 07/08/2018 Document Reviewed: 07/08/2018 Elsevier Patient Education  [place].

## 2020-06-05 NOTE — Progress Notes (Signed)
Subjective:    I'm seeing this patient as a consultation for:  Len Blalock, [place]. Note will be routed back to referring provider/PCP.  CC: B knee pain  I, Molly Weber, LAT, ATC, am serving as scribe for Dr. Lynne Leader.  HPI: Pt is a 39 y/o male presenting w/ c/o chronic B knee pain, R>L.  He has been seen by Ortho previously and was scheduled for surgery but never had it.  He locates his pain to his medial and lateral knee.  He notes that his R knee pain flared up a few weeks ago after getting his foot caught in his bed sheets.  Since then he feels a bit of knee instability and clicking.  Radiating pain: yes into the lower leg on the R Knee swelling: No Knee mechanical symptoms: yes, R knee is buckling Aggravating factors: Nothing in particular Treatments tried: Celebrex bid; prior steroid injections; braces, compression sleeves  Past medical history, Surgical history, Family history, Social history, Allergies, and medications have been entered into the medical record, reviewed.   Review of Systems: No new headache, visual changes, nausea, vomiting, diarrhea, constipation, dizziness, abdominal pain, skin rash, fevers, chills, night sweats, weight loss, swollen lymph nodes, body aches, joint swelling, muscle aches, chest pain, shortness of breath, mood changes, visual or auditory hallucinations.   Objective:    Vitals:   06/05/20 1243  BP: 108/78  Pulse: 78  SpO2: 99%   General: Well Developed, well nourished, and in no acute distress.  Neuro/Psych: Alert and oriented x3, extra-ocular muscles intact, able to move all 4 extremities, sensation grossly intact. Skin: Warm and dry, no rashes noted.  Respiratory: Not using accessory muscles, speaking in full sentences, trachea midline.  Cardiovascular: Pulses palpable, no extremity edema. Abdomen: Does not appear distended. MSK: Right knee no effusion. Normal-appearing otherwise. Normal motion with minimal crepitation. Mildly tender  palpation medial joint line. Laxity to anterior drawer testing. No laxity to posterior drawer or valgus varus stress test. Mildly positive medial McMurray's test. Intact strength.  Left knee normal-appearing no effusion. Normal motion with minimal crepitation. Minimally tender palpation medial joint line. No laxity to ligament exam testing. Mildly positive medial McMurray's test. Intact strength.  Lab and Radiology Results  X-ray images right knee obtained today personally and independently reviewed Minimal degenerative changes no acute fractures. Await formal radiology review  Diagnostic Limited MSK Ultrasound of: Right knee Quad tendon intact normal-appearing small osteophyte present superior pole of patella. Trace joint effusion superior patellar space Patellar tendon normal. Lateral joint line normal. Medial joint line narrowed degenerative. Posterior knee small Baker's cyst. 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  Small Baker's cyst.  Diagnostic Limited MSK Ultrasound of: Left knee Quad tendon intact normal-appearing small osteophyte present superior pole of patella. Trace joint effusion superior patellar space. Patellar tendon normal. Lateral joint line normal. Medial joint line narrowed degenerative partial extruded medial meniscus indicating tear. Posterior knee normal with no Baker's cyst. Impression: Medial compartment DJD with medial meniscus tear  Procedure: Real-time Ultrasound Guided Injection of right knee superior patellar space Device: Philips Affiniti 50G Images permanently stored and available for review in the ultrasound unit. Verbal informed consent obtained.  Discussed risks and benefits of procedure. Warned about infection bleeding damage to structures skin hypopigmentation and fat atrophy among others. Patient expresses understanding and agreement Time-out conducted.   Noted no overlying erythema, induration, or other  signs of local infection.   Skin prepped in a sterile fashion.   Local  anesthesia: Topical Ethyl chloride.   With sterile technique and under real time ultrasound guidance:  40 mg of Kenalog and 2 mL of Marcaine injected easily.   Completed without difficulty   Pain immediately resolved suggesting accurate placement of the medication.   Advised to call if fevers/chills, erythema, induration, drainage, or persistent bleeding.   Images permanently stored and available for review in the ultrasound unit.  Impression: Technically successful ultrasound guided injection.       MRI LEFT KNEE:   HISTORY: LEFT knee pain, paresthesias for one week   COMPARISON: None   CONTRAST: None   BODY PART: LEFT knee(s)   TECHNIQUE: Standard noncontrast MRI of the knee.   FINDINGS:   MENISCI:   Grade 2 internal signal with a peripheral oblique tear  through the medial meniscus at the junction of the body and posterior  horn seen best on sagittal images 5-9. No displaced fragments. Mild  free edge irregularity, with a small radial tear sagittal images 6-8.  Normal lateral meniscal morphology and signal. Meniscal root  insertions are maintained.   LIGAMENTS:   ACL and PCL are normal. MCL, LCL, posterolateral corner  structures and posterior knee capsule show no acute derangement.   JOINT SPACE:   No effusion or intra-articular loose body.   EXTENSOR MECHANISM:   Quadriceps and patellar tendon are normal.  Hoffa's fat pad shows no inflammation. Patellofemoral alignment  normal.   BONES AND CARTILAGE:   No acute fracture. No focal bone lesion.  Grade 3 fissures and smallfocal defects in the articular cartilage  along the patella vertical ridge, medial and lateral facets, axial  images 17-22, with subcortical edema. Lateral femoral compartment  cartilage appear maintained.   MUSCULATURE:   Muscle and tendon morphology is normal.   SOFT TISSUES:  No masses or fluid  collections.   IMPRESSION: 1. Complex tear medial meniscus has free edge radial  component and a posterior peripheral oblique component. No displaced  meniscal fragments.  2. Patellofemoral chondromalacia  Impression and Recommendations:    Assessment and Plan: 39 y.o. male with right knee pain after twisting pop injury few weeks ago.  Concerning for exacerbation of DJD versus meniscus tear.  It is possible he does have an ACL tear as well.  He does have slight laxity to anterior drawer testing however his mechanism of injury is very odd for ACL tear. Plan for injection and Voltaren gel quad strengthening.  If not pain and laxity and instability are not controlled would proceed with MRI.  Left knee pain chronic with meniscus tear and degenerative changes on ultrasound.  Doing pretty well currently.  Trial Voltaren gel quad strengthening.  PDMP not reviewed this encounter. Orders Placed This Encounter  Procedures  . [place] LIMITED JOINT SPACE STRUCTURES LOW BILAT(NO LINKED CHARGES)    Order Specific Question:   Reason for Exam (SYMPTOM  OR DIAGNOSIS REQUIRED)    Answer:   B knee pain    Order Specific Question:   Preferred imaging location?    Answer:   [facility]  . DG Knee AP/LAT W/Sunrise Right    Standing Status:   Future    Standing Expiration Date:   06/05/2021    Order Specific Question:   Reason for Exam (SYMPTOM  OR DIAGNOSIS REQUIRED)    Answer:   eval knee pain    Order Specific Question:   Preferred imaging location?    Answer:   [facility]    Order Specific  Question:   Radiology Contrast Protocol - do NOT remove file path    Answer:   \\[email or web address]   No orders of the defined types were placed in this encounter.   Discussed warning signs or symptoms. Please see discharge instructions. Patient expresses understanding.   The above documentation has been reviewed and is accurate and complete Lynne Leader, M.D.

## 2020-06-06 NOTE — Progress Notes (Signed)
Small bone spurs are present.  No severe arthritis or fracture.

## 2020-06-08 ENCOUNTER — Encounter: Payer: Self-pay | Admitting: Physician Assistant

## 2020-06-08 ENCOUNTER — Other Ambulatory Visit: Payer: Self-pay | Admitting: *Deleted

## 2020-06-08 DIAGNOSIS — E782 Mixed hyperlipidemia: Secondary | ICD-10-CM

## 2020-06-08 MED ORDER — ATORVASTATIN CALCIUM 20 MG PO TABS
20.0000 mg | ORAL_TABLET | Freq: Every day | ORAL | 3 refills | Status: DC
Start: 2020-06-08 — End: 2020-07-10

## 2020-06-20 ENCOUNTER — Telehealth (HOSPITAL_COMMUNITY): Payer: Self-pay | Admitting: *Deleted

## 2020-06-20 NOTE — Telephone Encounter (Signed)

## 2020-06-21 ENCOUNTER — Other Ambulatory Visit: Payer: Self-pay

## 2020-06-21 ENCOUNTER — Encounter (HOSPITAL_COMMUNITY): Payer: Self-pay

## 2020-06-21 ENCOUNTER — Ambulatory Visit (HOSPITAL_COMMUNITY)
Admission: RE | Admit: 2020-06-21 | Discharge: 2020-06-21 | Disposition: A | Discharge status: Home / Self Care | Payer: BC Managed Care – PPO | Source: Ambulatory Visit | Attending: Cardiovascular Disease | Admitting: Cardiovascular Disease

## 2020-06-21 DIAGNOSIS — R072 Precordial pain: Secondary | ICD-10-CM | POA: Insufficient documentation

## 2020-06-21 DIAGNOSIS — E782 Mixed hyperlipidemia: Secondary | ICD-10-CM | POA: Diagnosis not present

## 2020-06-21 MED ORDER — NITROGLYCERIN 0.4 MG SL SUBL
0.8000 mg | SUBLINGUAL_TABLET | Freq: Once | SUBLINGUAL | Status: AC
  Administered 2020-06-21: 0.8 mg via SUBLINGUAL

## 2020-06-21 MED ORDER — IOHEXOL 300 MG/ML  SOLN
100.0000 mL | Freq: Once | INTRAMUSCULAR | Status: AC | PRN
  Administered 2020-06-21: 100 mL via INTRAVENOUS

## 2020-06-21 MED ORDER — NITROGLYCERIN 0.4 MG SL SUBL
SUBLINGUAL_TABLET | SUBLINGUAL | Status: AC
  Filled 2020-06-21: qty 2

## 2020-06-26 ENCOUNTER — Ambulatory Visit (HOSPITAL_COMMUNITY): Payer: BC Managed Care – PPO | Attending: Cardiology

## 2020-06-26 ENCOUNTER — Other Ambulatory Visit: Payer: Self-pay

## 2020-06-26 DIAGNOSIS — E782 Mixed hyperlipidemia: Secondary | ICD-10-CM | POA: Diagnosis not present

## 2020-06-26 DIAGNOSIS — R072 Precordial pain: Secondary | ICD-10-CM | POA: Diagnosis not present

## 2020-06-26 LAB — ECHOCARDIOGRAM COMPLETE
Area-P 1/2: 3.7 cm2
S' Lateral: 3.1 cm

## 2020-07-09 ENCOUNTER — Encounter: Payer: Self-pay | Admitting: Physician Assistant

## 2020-07-09 ENCOUNTER — Ambulatory Visit: Payer: BC Managed Care – PPO | Admitting: Physician Assistant

## 2020-07-09 ENCOUNTER — Other Ambulatory Visit (INDEPENDENT_AMBULATORY_CARE_PROVIDER_SITE_OTHER): Payer: BC Managed Care – PPO

## 2020-07-09 VITALS — BP 130/80 | HR 80 | Ht 72.0 in | Wt 214.1 lb

## 2020-07-09 DIAGNOSIS — K625 Hemorrhage of anus and rectum: Secondary | ICD-10-CM | POA: Diagnosis not present

## 2020-07-09 DIAGNOSIS — K51911 Ulcerative colitis, unspecified with rectal bleeding: Secondary | ICD-10-CM

## 2020-07-09 DIAGNOSIS — R109 Unspecified abdominal pain: Secondary | ICD-10-CM

## 2020-07-09 LAB — CBC WITH DIFFERENTIAL/PLATELET
Basophils Absolute: 0 10*3/uL (ref 0.0–0.1)
Basophils Relative: 0.6 % (ref 0.0–3.0)
Eosinophils Absolute: 0 10*3/uL (ref 0.0–0.7)
Eosinophils Relative: 0.4 % (ref 0.0–5.0)
HCT: 46.7 % (ref 39.0–52.0)
Hemoglobin: 15.6 g/dL (ref 13.0–17.0)
Lymphocytes Relative: 21.5 % (ref 12.0–46.0)
Lymphs Abs: 1.4 10*3/uL (ref 0.7–4.0)
MCHC: 33.4 g/dL (ref 30.0–36.0)
MCV: 90.7 fl (ref 78.0–100.0)
Monocytes Absolute: 0.7 10*3/uL (ref 0.1–1.0)
Monocytes Relative: 10.3 % (ref 3.0–12.0)
Neutro Abs: 4.3 10*3/uL (ref 1.4–7.7)
Neutrophils Relative %: 67.2 % (ref 43.0–77.0)
Platelets: 268 10*3/uL (ref 150.0–400.0)
RBC: 5.15 Mil/uL (ref 4.22–5.81)
RDW: 14.7 % (ref 11.5–15.5)
WBC: 6.4 10*3/uL (ref 4.0–10.5)

## 2020-07-09 LAB — SEDIMENTATION RATE: Sed Rate: 5 mm/hr (ref 0–15)

## 2020-07-09 LAB — C-REACTIVE PROTEIN: CRP: <1 mg/dL (ref 0.5–20.0)

## 2020-07-09 MED ORDER — DICYCLOMINE HCL 10 MG PO CAPS
10.0000 mg | ORAL_CAPSULE | Freq: Three times a day (TID) | ORAL | 3 refills | Status: DC | PRN
Start: 2020-07-09 — End: 2021-01-17

## 2020-07-09 MED ORDER — MESALAMINE ER 0.375 G PO CP24: 375.0000 mg | ORAL_CAPSULE | Freq: Four times a day (QID) | ORAL | 3 refills | Status: DC

## 2020-07-09 MED ORDER — PLENVU 140 G PO SOLR: 1.0000 | ORAL | 0 refills | Status: DC

## 2020-07-09 MED ORDER — OMEPRAZOLE 40 MG PO CPDR
40.0000 mg | DELAYED_RELEASE_CAPSULE | Freq: Every day | ORAL | 3 refills | Status: AC
Start: 2020-07-09 — End: ?

## 2020-07-09 MED ORDER — PREDNISONE 10 MG PO TABS
ORAL_TABLET | ORAL | 0 refills | Status: AC
Start: 2020-07-09 — End: 2020-08-20

## 2020-07-09 NOTE — Progress Notes (Signed)
Subjective:    Patient ID: [place], male    DOB: 28-Dec-1980, 39 y.o.   MRN: [id number]  HPI "[place]" is a pleasant [name], new to GI today here to establish GI care after relocating to [place].  He relates history of ulcerative colitis which [place]  He says [place] last physician he saw was a Dr. Clementeen Graham.  He has been treated with Apriso which she had been on over Daniel past 2 years but says he has been out over Daniel past few months. He had also undergone prior colonoscopies while he was in [place] and has had prior EGDs as well.  He had been told in Daniel past that he had colon polyps and also was diagnosed with an ulcer at one point. He says after starting on Apriso he did well for a while and then felt like Daniel medication stopped working. Over Daniel past several weeks he has had exacerbation of symptoms with blood with all of his bowel movements to Daniel point of sometimes seeing small clots and seeing blood in Daniel commode.  He says generally he may have up to 3 bowel movements per day but over Daniel past few weeks but has been having at least 4-5 bowel movements per day.  This is also been associated with abdominal cramping and pain.  He has been eating very light.  No fever or chills, he has had some nausea without vomiting.  He is on chronic Celebrex for sciatica and back pain, no current acid blocker. Family history negative for IBD and colon cancer in Daniel immediate family. He had some labs done in July 2021, with normal LFTs, no recent CBC. His medical history includes PTSD, anxiety/depression, prior lumbar fusion, hypertension and possible prior MI 2015.  Work-up is in progress per Dr. Gwenlyn Found.  Review of Systems Pertinent positive and negative review of systems were noted in Daniel above HPI section.  All other review of systems was otherwise negative.  Outpatient Encounter Medications as of 07/09/2020  Medication Sig    . atorvastatin (LIPITOR) 20 MG tablet Take 1 tablet (20 mg total) by mouth daily.  . celecoxib (CELEBREX) 200 MG capsule Take 1 capsule (200 mg total) by mouth 2 (two) times daily.  . diazepam (VALIUM) 5 MG tablet Take 1 tablet (5 mg total) by mouth at bedtime.  . gabapentin (NEURONTIN) 300 MG capsule Take 1 capsule (300 mg total) by mouth 3 (three) times daily.  . mesalamine (APRISO) 0.375 g 24 hr capsule Take 1 capsule (0.375 g total) by mouth in Daniel morning, at noon, in Daniel evening, and at bedtime.  . prazosin (MINIPRESS) 1 MG capsule Take 2 capsules (2 mg total) by mouth at bedtime.  . vortioxetine HBr (TRINTELLIX) 10 MG TABS tablet Take 0.5 tablets (5 mg total) by mouth daily.  [name] vortioxetine HBr (TRINTELLIX) 5 MG TABS tablet Take 1 tablet (5 mg total) by mouth daily.  . [DISCONTINUED] mesalamine (APRISO) 0.375 g 24 hr capsule Take 1 capsule (0.375 g total) by mouth in Daniel morning, at noon, in Daniel evening, and at bedtime.  . dicyclomine (BENTYL) 10 MG capsule Take 1 capsule (10 mg total) by mouth 3 (three) times daily as needed (abdominal pain/ cramping).  [name] omeprazole (PRILOSEC) 40 MG capsule Take 1 capsule (40 mg total) by mouth daily.  [name] PEG-KCl-NaCl-NaSulf-Na Asc-C (PLENVU) 140 g SOLR Take 1 kit by mouth as directed. BIN: [id number]; GROUP: [id number]; PCN: [id number];  ID: [id number]; SHOULD BE $50  . predniSONE (DELTASONE) 10 MG tablet Take 2 tablets (20 mg total) by mouth daily with breakfast for 14 days, THEN 1.5 tablets (15 mg total) daily with breakfast for 14 days, THEN 1 tablet (10 mg total) daily with breakfast for 14 days.  . [DISCONTINUED] metoprolol tartrate (LOPRESSOR) 100 MG tablet TAKE 2 HOURS PRIOR TO SCAN   No facility-administered encounter medications on file as of 07/09/2020.   No Known Allergies Patient Active Problem List   Diagnosis Date Noted  . Hyperlipidemia 06/01/2020  . PTSD (post-traumatic stress disorder)   . Hypertension   . Depression   . Anxiety   . Ulcerative  colitis ([place]) 2019  . Myocardial infarction [place]) 2015   Social History   Socioeconomic History  . Marital status: Married    Spouse name: Not on file  . Number of children: Not on file  . Years of education: Not on file  . Highest education level: Not on file  Occupational History  . Not on file  Tobacco Use  . Smoking status: Current Every Day Smoker    Types: Cigarettes, Cigars  . Smokeless tobacco: Never Used  Vaping Use  . Vaping Use: Never used  Substance and Sexual Activity  . Alcohol use: Yes    Alcohol/week: 13.0 standard drinks    Types: 10 Cans of beer, 3 Shots of liquor per week  . Drug use: Yes    Types: Marijuana  . Sexual activity: Yes  Other Topics Concern  . Not on file  Social History Narrative   Was in [place] for 7 years, required lumbar surgery while in [place] and had poor outcome   Divorced and re-married   34 yo son   Social Determinants of Radio broadcast assistant Strain:   . Difficulty of Paying Living Expenses:   Food Insecurity:   . Worried About Charity fundraiser in [place] Last Year:   . Arboriculturist in [place] Last Year:   Transportation Needs:   . Film/video editor (Medical):   [name] Lack of Transportation (Non-Medical):   Physical Activity:   . Days of Exercise per Week:   . Minutes of Exercise per Session:   Stress:   . Feeling of Stress :   Social Connections:   . Frequency of Communication with Friends and Family:   . Frequency of Social Gatherings with Friends and Family:   . Attends Religious Services:   . Active Member of Clubs or Organizations:   . Attends Archivist Meetings:   [name] Marital Status:   Intimate Partner Violence:   . Fear of Current or Ex-Partner:   . Emotionally Abused:   [name] Physically Abused:   . Sexually Abused:     [name] family history includes Alcohol abuse in his paternal grandmother; COPD in his maternal grandfather and paternal grandmother; Colon cancer in his maternal  uncle; Depression in his brother, brother, father, and mother; Diabetes in his maternal grandmother and paternal grandmother; Heart attack in his maternal grandmother, mother, and paternal grandfather; Heart failure in his paternal grandfather; Hyperlipidemia in his maternal grandmother; Hypertension in his maternal grandmother and paternal grandmother; Kidney disease in his maternal grandfather; Lung cancer in his paternal aunt and paternal grandmother; Osteoarthritis in his father, maternal grandfather, mother, and paternal grandfather; Pancreatic cancer in his maternal aunt; Renal cancer in his maternal grandfather.      Objective:    Vitals:  07/09/20 1534  BP: 130/80  Pulse: 80    Physical Exam Well-developed well-nourished AA /male in no acute distress.  Height, Weight,214 BMI 29.0  ,accompanied by his wife and young child HEENT; nontraumatic normocephalic, EOMI, PE RR LA, sclera anicteric. Oropharynx ;not done Neck; supple, no JVD Cardiovascular; regular rate and rhythm with S1-S2, no murmur rub or gallop Pulmonary; Clear bilaterally Abdomen; soft, mildly tender bilateral lower quadrants right greater than left, midline incisional scar from prior spine surgery, nondistended, no palpable mass or hepatosplenomegaly, bowel sounds are active Rectal; not done today Skin; benign exam, no jaundice rash or appreciable lesions Extremities; no clubbing cyanosis or edema skin warm and dry Neuro/Psych; alert and oriented x4, grossly nonfocal mood and affect appropriate       Assessment & Plan:   #12 [name] with diagnosis of ulcerative colitis made in 2019, extent of colitis not known at this time.  He had been managed on Apriso but had run out of medication a few months ago and did not feel like Apriso was working well prior to that time. Presents now with exacerbation of symptoms over Daniel past several weeks with abdominal pain cramping increase in stool frequency  with loose stools and increase in blood with bowel movements.  Symptoms are suggestive of ulcerative colitis exacerbation, poorly controlled colitis.  #2 history of colon polyps by patient report question pseudopolyps 3.  Prior history of peptic ulcer disease by patient report 4.  Chronic NSAID use 5.  Hypertension 6.  PTSD 7.  Anxiety depression 8.  Possible previous MI 2015, cardiac work-up in progress #9 status post lumbar fusion  Plan; CBC with differential, sed rate, CRP. Patient is signed a release and will obtain copies of his previous records. Patient will be scheduled for colonoscopy with Dr. Bryan Lemma.  Procedure was discussed in detail with Daniel patient including indications risks and benefits and he is agreeable to proceed. He has completed COVID-19 vaccination. Restart Apriso 0.375, 4 tablets daily Start Bentyl 10 mg p.o. every 6 hours as needed for abdominal pain/cramping Start prednisone 20 mg p.o. every morning x2 weeks, then taper to 15 mg p.o. daily x2 weeks then taper to 10 mg daily x2 weeks. Start omeprazole 40 mg p.o. every morning. Caution patient to decrease Celebrex use is much as possible. Further recommendations pending review of prior records and findings at colonoscopy.   Barri Neidlinger S Neziah Vogelgesang PA-C 07/09/2020   Cc: Inda Coke, [place]

## 2020-07-09 NOTE — Patient Instructions (Addendum)
If you are age 37 or older, your body mass index should be between 23-30. Your Body mass index is 29.04 kg/m. If this is out of the aforementioned range listed, please consider follow up with your Primary Care Provider.  If you are age 58 or younger, your body mass index should be between 19-25. Your Body mass index is 29.04 kg/m. If this is out of the aformentioned range listed, please consider follow up with your Primary Care Provider.   You have been scheduled for a colonoscopy. Please follow written instructions given to you at your visit today.  Please pick up your prep supplies at the pharmacy within the next 1-3 days. If you use inhalers (even only as needed), please bring them with you on the day of your procedure.  Your provider has requested that you go to the basement level for lab work before leaving today. Press "B" on the elevator. The lab is located at the first door on the left as you exit the elevator.  Due to recent changes in healthcare laws, you may see the results of your imaging and laboratory studies on MyChart before your provider has had a chance to review them.  We understand that in some cases there may be results that are confusing or concerning to you. Not all laboratory results come back in the same time frame and the provider may be waiting for multiple results in order to interpret others.  Please give [place] 48 hours in order for your provider to thoroughly review all the results before contacting the office for clarification of your results.   Continue Apriso as originally directed.  Start Dicyclomine 10 mg 3 times a day as needed for abdominal pain/cramping Start Omeprazole 40 mg daily Start Prednisone 10 mg taper as directed on packaging.  Follow up pending the results of your labs and colonoscopy or as needed.

## 2020-07-10 ENCOUNTER — Other Ambulatory Visit: Payer: Self-pay

## 2020-07-10 ENCOUNTER — Encounter: Payer: Self-pay | Admitting: Cardiovascular Disease

## 2020-07-10 ENCOUNTER — Ambulatory Visit: Payer: BC Managed Care – PPO | Admitting: Cardiovascular Disease

## 2020-07-10 VITALS — BP 130/88 | HR 77 | Ht 72.0 in | Wt 214.4 lb

## 2020-07-10 DIAGNOSIS — R0789 Other chest pain: Secondary | ICD-10-CM

## 2020-07-10 NOTE — Progress Notes (Signed)
Mr. Si returns today for follow-up of his outpatient test.  His 2D echo was normal except for some mild LVH and his coronary CTA was entirely normal with a coronary calcium score 0 normal coronary arteries.  I have reassured him that his chest pain is noncardiac.  He was on atorvastatin 20 for hyperlipidemia but given his normal coronary arteries and his young age I suspect we can discontinue this.  I will see him back as needed.  Lorretta Harp, M.D., [place], [facility], [place] [address] [place], [place]  [address]  [phone] 07/10/2020 4:06 PM

## 2020-07-10 NOTE — Patient Instructions (Signed)
Medication Instructions:  STOP Lipitor  Follow-Up: At [facility], you and your health needs are our priority.  As part of our continuing mission to provide you with exceptional heart care, we have created designated Provider Care Teams.  These Care Teams include your primary Cardiologist (physician) and Advanced Practice Providers (APPs -  Physician Assistants and Nurse Practitioners) who all work together to provide you with the care you need, when you need it.  We recommend signing up for the patient portal called "MyChart".  Sign up information is provided on this After Visit Summary.  MyChart is used to connect with patients for Virtual Visits (Telemedicine).  Patients are able to view lab/test results, encounter notes, upcoming appointments, etc.  Non-urgent messages can be sent to your provider as well.   To learn more about what you can do with MyChart, go to [email or web address].    Your next appointment:   AS NEEDED with Dr. Gwenlyn Found

## 2020-07-12 ENCOUNTER — Encounter: Payer: Self-pay | Admitting: Gastroenterology

## 2020-07-17 ENCOUNTER — Encounter: Payer: Self-pay | Admitting: Gastroenterology

## 2020-07-17 ENCOUNTER — Other Ambulatory Visit: Payer: Self-pay

## 2020-07-17 ENCOUNTER — Ambulatory Visit (AMBULATORY_SURGERY_CENTER): Payer: BC Managed Care – PPO | Admitting: Gastroenterology

## 2020-07-17 VITALS — BP 142/85 | HR 65 | Temp 96.9°F | Resp 18 | Ht 72.0 in | Wt 214.0 lb

## 2020-07-17 DIAGNOSIS — K5730 Diverticulosis of large intestine without perforation or abscess without bleeding: Secondary | ICD-10-CM

## 2020-07-17 DIAGNOSIS — K625 Hemorrhage of anus and rectum: Secondary | ICD-10-CM

## 2020-07-17 DIAGNOSIS — R194 Change in bowel habit: Secondary | ICD-10-CM | POA: Diagnosis not present

## 2020-07-17 DIAGNOSIS — K51911 Ulcerative colitis, unspecified with rectal bleeding: Secondary | ICD-10-CM

## 2020-07-17 DIAGNOSIS — K642 Third degree hemorrhoids: Secondary | ICD-10-CM

## 2020-07-17 DIAGNOSIS — D125 Benign neoplasm of sigmoid colon: Secondary | ICD-10-CM

## 2020-07-17 DIAGNOSIS — R109 Unspecified abdominal pain: Secondary | ICD-10-CM

## 2020-07-17 DIAGNOSIS — R197 Diarrhea, unspecified: Secondary | ICD-10-CM

## 2020-07-17 DIAGNOSIS — K635 Polyp of colon: Secondary | ICD-10-CM

## 2020-07-17 MED ORDER — SODIUM CHLORIDE 0.9 % IV SOLN: 500.0000 mL | INTRAVENOUS | Status: DC

## 2020-07-17 NOTE — Progress Notes (Signed)
Vs CW ° °

## 2020-07-17 NOTE — Progress Notes (Signed)
PT taken to PACU. Monitors in place. VSS. Report given to RN. 

## 2020-07-17 NOTE — Patient Instructions (Signed)
Continue Prednisone with wean as outlined.   Use fiber, for example Citrucel, Fibercon, Konsyl or Metamucil. -this is to help bulk up your stools.   YOU HAD AN ENDOSCOPIC PROCEDURE TODAY AT [facility]:   Refer to the procedure report that was given to you for any specific questions about what was found during the examination.  If the procedure report does not answer your questions, please call your gastroenterologist to clarify.  If you requested that your care partner not be given the details of your procedure findings, then the procedure report has been included in a sealed envelope for you to review at your convenience later.  YOU SHOULD EXPECT: Some feelings of bloating in the abdomen. Passage of more gas than usual.  Walking can help get rid of the air that was put into your GI tract during the procedure and reduce the bloating. If you had a lower endoscopy (such as a colonoscopy or flexible sigmoidoscopy) you may notice spotting of blood in your stool or on the toilet paper. If you underwent a bowel prep for your procedure, you may not have a normal bowel movement for a few days.  Please Note:  You might notice some irritation and congestion in your nose or some drainage.  This is from the oxygen used during your procedure.  There is no need for concern and it should clear up in a day or so.  SYMPTOMS TO REPORT IMMEDIATELY:   Following lower endoscopy (colonoscopy or flexible sigmoidoscopy):  Excessive amounts of blood in the stool  Significant tenderness or worsening of abdominal pains  Swelling of the abdomen that is new, acute  Fever of 100F or higher  For urgent or emergent issues, a gastroenterologist can be reached at any hour by calling [phone]. Do not use MyChart messaging for urgent concerns.    DIET:  We do recommend a small meal at first, but then you may proceed to your regular diet.  Drink plenty of fluids but you should avoid alcoholic beverages  for 24 hours.  ACTIVITY:  You should plan to take it easy for the rest of today and you should NOT DRIVE or use heavy machinery until tomorrow (because of the sedation medicines used during the test).    FOLLOW UP: Our staff will call the number listed on your records 48-72 hours following your procedure to check on you and address any questions or concerns that you may have regarding the information given to you following your procedure. If we do not reach you, we will leave a message.  We will attempt to reach you two times.  During this call, we will ask if you have developed any symptoms of COVID 19. If you develop any symptoms (ie: fever, flu-like symptoms, shortness of breath, cough etc.) before then, please call [phone].  If you test positive for Covid 19 in the 2 weeks post procedure, please call and report this information to [place].    If any biopsies were taken you will be contacted by phone or by letter within the next 1-3 weeks.  Please call us at [phone] if you have not heard about the biopsies in 3 weeks.    SIGNATURES/CONFIDENTIALITY: You and/or your care partner have signed paperwork which will be entered into your electronic medical record.  These signatures attest to the fact that that the information above on your After Visit Summary has been reviewed and is understood.  Full responsibility of the confidentiality of this  discharge information lies with you and/or your care-partner.

## 2020-07-17 NOTE — Op Note (Signed)
[facility] Patient Name: Daniel Marsh Procedure Date: 07/17/2020 2:09 PM MRN: [id number] Endoscopist: Gerrit Heck , MD Age: 39 Referring MD:  Date of Birth: 1981/08/02 Gender: Male Account #: [id number] Procedure:                Colonoscopy Indications:              Lower abdominal pain, Hematochezia, Ulcerative                            colitis, Change in bowel habits, Diarrhea                           39 yo male diagnosed with UC in 2019 at outside                            facility, treated with Apriso, now presents with                            recent recurrence of index symptoms while off all                            IBD therapy (increased stool frequency,                            hematochezia, lower abdominal cramping). Normal                            CBC, ESR, CRP. Was started on Prednisone 20 mg/day                            and restarted Apriso 1.5 gm/day last week with good                            clinical response. Also with reported history of                            colon polyps. Medicines:                Monitored Anesthesia Care Procedure:                Pre-Anesthesia Assessment:                           - Prior to the procedure, a History and Physical                            was performed, and patient medications and                            allergies were reviewed. The patient's tolerance of                            previous anesthesia was also reviewed. The risks  and benefits of the procedure and the sedation                            options and risks were discussed with the patient.                            All questions were answered, and informed consent                            was obtained. Prior Anticoagulants: The patient has                            taken no previous anticoagulant or antiplatelet                            agents. ASA Grade Assessment: II - A patient with                             mild systemic disease. After reviewing the risks                            and benefits, the patient was deemed in                            satisfactory condition to undergo the procedure.                           After obtaining informed consent, the colonoscope                            was passed under direct vision. Throughout the                            procedure, the patient's blood pressure, pulse, and                            oxygen saturations were monitored continuously. The                            Colonoscope was introduced through the anus and                            advanced to the the cecum, identified by                            appendiceal orifice and ileocecal valve. The                            colonoscopy was performed without difficulty. The                            patient tolerated the procedure well. The quality  of the bowel preparation was excellent. The                            ileocecal valve, appendiceal orifice, and rectum                            were photographed. Scope In: 2:28:56 PM Scope Out: 2:51:27 PM Scope Withdrawal Time: 0 hours 16 minutes 27 seconds  Total Procedure Duration: 0 hours 22 minutes 31 seconds  Findings:                 Hemorrhoids and skin tags were found on perianal                            exam.                           A 4 mm polyp was found in the sigmoid colon. The                            polyp was sessile. The polyp was removed with a                            cold snare. Resection and retrieval were complete.                            Estimated blood loss was minimal.                           Scattered mild inflammation characterized by                            erythema was found in the sigmoid colon, located                            33-40 cm from the anal verge. No associated ulcers,                            aphthae, or erosions noted.  Biopsies were taken                            with a cold forceps for histology. Estimated blood                            loss was minimal.                           A few small-mouthed diverticula were found in the                            sigmoid colon.                           The remainder of the colon was otherwise normal  appearing. Several mucosal biopsies were obtained                            from the descending colon, recto-sigmoid colon, and                            rectum with a cold forceps for histology. Estimated                            blood loss was minimal.                           Non-bleeding internal hemorrhoids were found during                            retroflexion. The hemorrhoids were medium-sized and                            Grade III (internal hemorrhoids that prolapse but                            require manual reduction). Complications:            No immediate complications. Estimated Blood Loss:     Estimated blood loss was minimal. Impression:               - Hemorrhoids found on perianal exam.                           - One 4 mm polyp in the sigmoid colon, removed with                            a cold snare. Resected and retrieved.                           - Scattered mild inflammation was found in the                            sigmoid colon. Biopsied.                           - Diverticulosis in the sigmoid colon.                           - Normal mucosa in the rectum, in the recto-sigmoid                            colon, in the descending colon, in the transverse                            colon, in the ascending colon and in the cecum.                            Biopsied.                           -  Non-bleeding internal hemorrhoids. Recommendation:           - Patient has a contact number available for                            emergencies. The signs and symptoms of potential                             delayed complications were discussed with the                            patient. Return to normal activities tomorrow.                            Written discharge instructions were provided to the                            patient.                           - Resume previous diet.                           - Continue present medications.                           - Continue Prednisone with wean as outlined.                           - Await pathology results.                           - Repeat colonoscopy for surveillance based on                            pathology results.                           - Return to GI clinic at appointment to be                            scheduled.                           - Use fiber, for example Citrucel, Fibercon, Konsyl                            or Metamucil. Gerrit Heck, MD 07/17/2020 3:01:53 PM

## 2020-07-17 NOTE — Progress Notes (Signed)
Agree with the assessment and plan as outlined by Nicoletta Ba, PA-C.  Keayra Graham, DO, [facility]

## 2020-07-17 NOTE — Progress Notes (Signed)
Called to room to assist during endoscopic procedure.  Patient ID and intended procedure confirmed with present staff. Received instructions for my participation in the procedure from the performing physician.  

## 2020-07-19 ENCOUNTER — Telehealth: Payer: Self-pay

## 2020-07-19 NOTE — Telephone Encounter (Signed)
  Follow up Call-  Call back number 07/17/2020  Post procedure Call Back phone  # [phone]  Permission to leave phone message Yes     Patient questions:  Do you have a fever, pain , or abdominal swelling? No. Pain Score  0 *  Have you tolerated food without any problems? Yes.    Have you been able to return to your normal activities? Yes.    Do you have any questions about your discharge instructions: Diet   No. Medications  No. Follow up visit  No.  Do you have questions or concerns about your Care? No.  Actions: * If pain score is 4 or above: No action needed, pain <4. 1. Have you developed a fever since your procedure? no  2.   Have you had an respiratory symptoms (SOB or cough) since your procedure? no  3.   Have you tested positive for COVID 19 since your procedure no  4.   Have you had any family members/close contacts diagnosed with the COVID 19 since your procedure?  no   If yes to any of these questions please route to Joylene John, RN and Joella Prince, RN

## 2020-07-23 ENCOUNTER — Encounter: Payer: Self-pay | Admitting: Gastroenterology

## 2020-08-03 DIAGNOSIS — J069 Acute upper respiratory infection, unspecified: Secondary | ICD-10-CM | POA: Diagnosis not present

## 2020-08-03 DIAGNOSIS — Z20828 Contact with and (suspected) exposure to other viral communicable diseases: Secondary | ICD-10-CM | POA: Diagnosis not present

## 2020-08-03 DIAGNOSIS — J22 Unspecified acute lower respiratory infection: Secondary | ICD-10-CM | POA: Diagnosis not present

## 2020-08-22 ENCOUNTER — Ambulatory Visit (INDEPENDENT_AMBULATORY_CARE_PROVIDER_SITE_OTHER): Payer: BC Managed Care – PPO | Admitting: Physician Assistant

## 2020-08-22 ENCOUNTER — Encounter: Payer: Self-pay | Admitting: Physician Assistant

## 2020-08-22 ENCOUNTER — Other Ambulatory Visit: Payer: Self-pay

## 2020-08-22 VITALS — BP 130/80 | HR 76 | Temp 99.6°F | Ht 72.0 in | Wt 215.0 lb

## 2020-08-22 DIAGNOSIS — R05 Cough: Secondary | ICD-10-CM | POA: Diagnosis not present

## 2020-08-22 DIAGNOSIS — R059 Cough, unspecified: Secondary | ICD-10-CM

## 2020-08-22 DIAGNOSIS — R071 Chest pain on breathing: Secondary | ICD-10-CM | POA: Diagnosis not present

## 2020-08-22 MED ORDER — BUDESONIDE-FORMOTEROL FUMARATE 160-4.5 MCG/ACT IN AERO
2.0000 | INHALATION_SPRAY | Freq: Two times a day (BID) | RESPIRATORY_TRACT | 3 refills | Status: DC
Start: 2020-08-22 — End: 2020-10-08

## 2020-08-22 NOTE — Progress Notes (Signed)
Daniel Marsh is a 39 y.o. male here for a follow up of a pre-existing problem.  I acted as a Education administrator for [employer], PA-C Anselmo Pickler, LPN   History of Present Illness:   Chief Complaint  Patient presents with  . Cough    HPI   Cough Pt c/o persistent cough x 1 month, coughing and expectorating brown blood speckled sputum. Chest congestion. Seen at Urgent care on 9/3 was Dx with Pneumonia and treated with z-pak, prednisone and Albuterol Inhaler. COVID test rapid and send out Negative. Denies fever or chills. Has been fatigue and having headaches.   He is having SOB with exertion and ongoing fatigue. Pain with deep inspiration and a "severe, worse then usual" type of pain in his chest when he coughs.  Reports that he went on vacation recently and all of his family was sick, of varying degrees. Father was also as sick as him and was also COVID negative.  Past Medical History:  Diagnosis Date  . Anxiety   . Arthritis   . Depression   . Hypertension   . Myocardial infarction ([place]) 2015  . PTSD (post-traumatic stress disorder)   . Ulcerative colitis ([place]) 2019     Social History   Tobacco Use  . Smoking status: Current Every Day Smoker    Types: Cigarettes, Cigars  . Smokeless tobacco: Never Used  Vaping Use  . Vaping Use: Never used  Substance Use Topics  . Alcohol use: Yes    Alcohol/week: 13.0 standard drinks    Types: 10 Cans of beer, 3 Shots of liquor per week  . Drug use: Yes    Types: Marijuana    Past Surgical History:  Procedure Laterality Date  . ANKLE SURGERY Right 2011  . KNEE SURGERY Left 2020  . SPINE SURGERY  2013   Lumbar fusion L4-S1  . WISDOM TOOTH EXTRACTION      Family History  Problem Relation Age of Onset  . Osteoarthritis Mother   . Depression Mother   . Heart attack Mother   . Osteoarthritis Father   . Depression Father   . Depression Brother   . Diabetes Maternal Grandmother   . Hyperlipidemia Maternal Grandmother    . Hypertension Maternal Grandmother   . Heart attack Maternal Grandmother   . Osteoarthritis Maternal Grandfather   . COPD Maternal Grandfather   . Kidney disease Maternal Grandfather   . Renal cancer Maternal Grandfather   . Alcohol abuse Paternal Grandmother   . COPD Paternal Grandmother   . Diabetes Paternal Grandmother   . Hypertension Paternal Grandmother   . Lung cancer Paternal Grandmother   . Osteoarthritis Paternal Grandfather   . Heart attack Paternal Grandfather   . Heart failure Paternal Grandfather   . Depression Brother   . Pancreatic cancer Maternal Aunt   . Lung cancer Paternal Aunt   . Colon cancer Maternal Uncle     No Known Allergies  Current Medications:   Current Outpatient Medications:  .  celecoxib (CELEBREX) 200 MG capsule, Take 1 capsule (200 mg total) by mouth 2 (two) times daily., Disp: 180 capsule, Rfl: 2 .  diazepam (VALIUM) 5 MG tablet, Take 1 tablet (5 mg total) by mouth at bedtime., Disp: 30 tablet, Rfl: 2 .  dicyclomine (BENTYL) 10 MG capsule, Take 1 capsule (10 mg total) by mouth 3 (three) times daily as needed (abdominal pain/ cramping)., Disp: 90 capsule, Rfl: 3 .  gabapentin (NEURONTIN) 300 MG capsule, Take 1 capsule (300 mg total)  by mouth 3 (three) times daily., Disp: 270 capsule, Rfl: 2 .  mesalamine (APRISO) 0.375 g 24 hr capsule, Take 1 capsule (0.375 g total) by mouth in the morning, at noon, in the evening, and at bedtime., Disp: 360 capsule, Rfl: 3 .  omeprazole (PRILOSEC) 40 MG capsule, Take 1 capsule (40 mg total) by mouth daily., Disp: 90 capsule, Rfl: 3 .  prazosin (MINIPRESS) 1 MG capsule, Take 2 capsules (2 mg total) by mouth at bedtime., Disp: 180 capsule, Rfl: 1 .  vortioxetine HBr (TRINTELLIX) 5 MG TABS tablet, Take 1 tablet (5 mg total) by mouth daily., Disp: 14 tablet, Rfl: 0 .  albuterol (VENTOLIN HFA) 108 (90 Base) MCG/ACT inhaler, INHALE 1 TO 2 PUFFS BY MOUTH EVERY 4 TO 6 HOURS, Disp: , Rfl:  .  budesonide-formoterol  (SYMBICORT) 160-4.5 MCG/ACT inhaler, Inhale 2 puffs into the lungs 2 (two) times daily., Disp: 1 each, Rfl: 3   Review of Systems:   ROS  Negative unless otherwise specified per HPI.  Vitals:   Vitals:   08/22/20 1546  BP: 130/80  Pulse: 76  Temp: 99.6 F (37.6 C)  TempSrc: Temporal  SpO2: 96%  Weight: 215 lb (97.5 kg)  Height: 6' (1.829 m)     Body mass index is 29.16 kg/m.  Physical Exam:   Physical Exam Vitals and nursing note reviewed.  Constitutional:      General: He is not in acute distress.    Appearance: He is well-developed. He is not ill-appearing or toxic-appearing.  Cardiovascular:     Rate and Rhythm: Normal rate and regular rhythm.     Pulses: Normal pulses.     Heart sounds: Normal heart sounds, S1 normal and S2 normal.     Comments: No LE edema Pulmonary:     Effort: Pulmonary effort is normal.     Breath sounds: Examination of the right-upper field reveals decreased breath sounds. Examination of the left-upper field reveals decreased breath sounds. Decreased breath sounds present.  Skin:    General: Skin is warm and dry.  Neurological:     Mental Status: He is alert.     GCS: GCS eye subscore is 4. GCS verbal subscore is 5. GCS motor subscore is 6.  Psychiatric:        Speech: Speech normal.        Behavior: Behavior normal. Behavior is cooperative.       Assessment and Plan:   Daniel was seen today for cough.  Diagnoses and all orders for this visit:  Cough; Chest pain on breathing Due to SOB, chest pain, will obtain chest CT for further imaging to r/o atypical infection, PE, other. Also start symbicort BID. If worsening symptoms in the meantime, recommend ER evaluation. -     Novel Coronavirus, NAA ([facility]) -     CT ANGIO CHEST PE W OR WO CONTRAST; Future  Other orders -     budesonide-formoterol (SYMBICORT) 160-4.5 MCG/ACT inhaler; Inhale 2 puffs into the lungs 2 (two) times daily.   CMA or LPN served as scribe during this  visit. History, Physical, and Plan performed by medical provider. The above documentation has been reviewed and is accurate and complete.   Inda Coke, PA-C

## 2020-08-23 ENCOUNTER — Other Ambulatory Visit: Payer: Self-pay | Admitting: *Deleted

## 2020-08-23 ENCOUNTER — Telehealth: Payer: Self-pay | Admitting: *Deleted

## 2020-08-23 DIAGNOSIS — R071 Chest pain on breathing: Secondary | ICD-10-CM

## 2020-08-23 NOTE — Telephone Encounter (Signed)
Pt called back told him I have placed an order for STAT Chest x-ray to be done at [facility] due to insurance denied CT scan that [facility] had ordered. Told him someone will contact him to schedule an appt. Pt verbalized understanding.

## 2020-08-23 NOTE — Telephone Encounter (Signed)
Left message on voicemail to call office. Orders placed for STAT Chest x-ray at [facility] due to CT denied.

## 2020-08-25 LAB — SARS-COV-2, NAA 2 DAY TAT

## 2020-08-25 LAB — NOVEL CORONAVIRUS, NAA: SARS-CoV-2, NAA: NOT DETECTED

## 2020-08-30 ENCOUNTER — Ambulatory Visit (HOSPITAL_COMMUNITY)
Admission: RE | Admit: 2020-08-30 | Discharge: 2020-08-30 | Disposition: A | Discharge status: Home / Self Care | Payer: BC Managed Care – PPO | Source: Ambulatory Visit | Attending: Physician Assistant | Admitting: Physician Assistant

## 2020-08-30 ENCOUNTER — Other Ambulatory Visit: Payer: Self-pay

## 2020-08-30 DIAGNOSIS — R071 Chest pain on breathing: Secondary | ICD-10-CM | POA: Diagnosis not present

## 2020-08-30 DIAGNOSIS — R079 Chest pain, unspecified: Secondary | ICD-10-CM | POA: Diagnosis not present

## 2020-09-27 ENCOUNTER — Telehealth: Payer: Self-pay

## 2020-09-27 NOTE — Telephone Encounter (Signed)
Spoke to pt's wife Estill Bamberg, told her I do not have FMLA paperwork on him. She said [employer] filled it out and she picked it up. Asked her to send copy of FMLA and new FMLA paperwork to me. Estill Bamberg verbalized understanding and will get it to me.

## 2020-09-27 NOTE — Telephone Encounter (Signed)
Pt's wife called to let us know some more fmla paperwork was faxed over for the pt. She is asking if we can extend his FMLA til dec 31. Pt has an appt with Sam on 11/2. They are sending it ahead of time because the current fmla paperwork only covers to 10/31. Once paperwork is received I will put it in front basket and put notification in phone note.

## 2020-10-02 ENCOUNTER — Ambulatory Visit: Payer: BC Managed Care – PPO | Admitting: Physician Assistant

## 2020-10-02 NOTE — Telephone Encounter (Signed)
Pt has an appt on Monday, wife brought paperwork today to her appt.

## 2020-10-08 ENCOUNTER — Other Ambulatory Visit: Payer: Self-pay

## 2020-10-08 ENCOUNTER — Ambulatory Visit: Payer: BC Managed Care – PPO | Admitting: Physician Assistant

## 2020-10-08 ENCOUNTER — Encounter: Payer: Self-pay | Admitting: Physician Assistant

## 2020-10-08 VITALS — BP 144/69 | HR 84 | Temp 99.0°F | Ht 72.0 in | Wt 214.0 lb

## 2020-10-08 DIAGNOSIS — I10 Essential (primary) hypertension: Secondary | ICD-10-CM | POA: Diagnosis not present

## 2020-10-08 DIAGNOSIS — Z23 Encounter for immunization: Secondary | ICD-10-CM | POA: Diagnosis not present

## 2020-10-08 DIAGNOSIS — I219 Acute myocardial infarction, unspecified: Secondary | ICD-10-CM | POA: Diagnosis not present

## 2020-10-08 DIAGNOSIS — F419 Anxiety disorder, unspecified: Secondary | ICD-10-CM

## 2020-10-08 DIAGNOSIS — M545 Low back pain, unspecified: Secondary | ICD-10-CM

## 2020-10-08 DIAGNOSIS — F331 Major depressive disorder, recurrent, moderate: Secondary | ICD-10-CM

## 2020-10-08 MED ORDER — VORTIOXETINE HBR 10 MG PO TABS: 10.0000 mg | ORAL_TABLET | Freq: Every day | ORAL | 1 refills | Status: DC

## 2020-10-08 NOTE — Progress Notes (Signed)
Daniel Marsh is a 39 y.o. male is here for follow up.  I acted as a Education administrator for [employer], PA-C Anselmo Pickler, LPN   History of Present Illness:   Chief Complaint  Patient presents with  . Anxiety  . Depression  . Hypertension    HPI   Hypertension Pt is currently not on medications. He is not monitoring his blood pressure. Pt denies dizziness, blurred vision, chest pain, SOB or lower leg edema. He has been c/o Migraines 4 x's a month. Denies excessive caffeine intake, stimulant usage, excessive alcohol intake or increase in salt consumption.   BP Readings from Last 3 Encounters:  10/08/20 (!) 144/69  08/22/20 130/80  07/17/20 (!) 142/85     Anxiety / Depression Pt was started on Trintellex 5 mg daily at visit in June and increased to 10 mg in July. Pt states it is controlling his moods better. Denies SI/HI. Still interested in seeing a therapist.   History of L4-S1 fusion; Chronic back pain Most recent imaging in December 2020 during ED visit at [facility]. 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  Xray of hip shows: There is no acute displaced fracture or malalignment. No bone destruction or erosion. The visualized joint spaces and periarticular soft tissues are normal. L4-5 discectomy and fusion changes with intact hardware. Normal appearance of the imaged osseous pelvis.   Current rx: 300 mg Neurontin TID   Health Maintenance Due  Topic Date Due  . Hepatitis C Screening  Never done  . HIV Screening  Never done    Past Medical History:  Diagnosis Date  . Anxiety   . Arthritis   . Depression   . Hypertension   . Myocardial infarction ([place]) 2015  . PTSD (post-traumatic stress disorder)   . Ulcerative colitis ([place]) 2019     Social History   Tobacco Use  . Smoking status: Current Every Day Smoker    Types: Cigarettes, Cigars  . Smokeless  tobacco: Never Used  Vaping Use  . Vaping Use: Never used  Substance Use Topics  . Alcohol use: Yes    Alcohol/week: 13.0 standard drinks    Types: 10 Cans of beer, 3 Shots of liquor per week  . Drug use: Yes    Types: Marijuana    Past Surgical History:  Procedure Laterality Date  . ANKLE SURGERY Right 2011  . KNEE SURGERY Left 2020  . SPINE SURGERY  2013   Lumbar fusion L4-S1  . WISDOM TOOTH EXTRACTION      Family History  Problem Relation Age of Onset  . Osteoarthritis Mother   . Depression Mother   . Heart attack Mother   . Osteoarthritis Father   . Depression Father   . Depression Brother   . Diabetes Maternal Grandmother   . Hyperlipidemia Maternal Grandmother   . Hypertension Maternal Grandmother   . Heart attack Maternal Grandmother   . Osteoarthritis Maternal Grandfather   . COPD Maternal Grandfather   . Kidney disease Maternal Grandfather   . Renal cancer Maternal Grandfather   . Alcohol abuse Paternal Grandmother   . COPD Paternal Grandmother   . Diabetes Paternal Grandmother   . Hypertension Paternal Grandmother   . Lung cancer Paternal Grandmother   . Osteoarthritis Paternal Grandfather   . Heart attack Paternal Grandfather   . Heart failure Paternal Grandfather   . Depression Brother   . Pancreatic cancer Maternal Aunt   .  Lung cancer Paternal Aunt   . Colon cancer Maternal Uncle     PMHx, SurgHx, SocialHx, FamHx, Medications, and Allergies were reviewed in the Visit Navigator and updated as appropriate.   Patient Active Problem List   Diagnosis Date Noted  . Hyperlipidemia 06/01/2020  . PTSD (post-traumatic stress disorder)   . Hypertension   . Depression   . Anxiety   . Ulcerative colitis ([place]) 2019  . Myocardial infarction [facility]) 2015    Social History   Tobacco Use  . Smoking status: Current Every Day Smoker    Types: Cigarettes, Cigars  . Smokeless tobacco: Never Used  Vaping Use  . Vaping Use: Never used  Substance Use Topics   . Alcohol use: Yes    Alcohol/week: 13.0 standard drinks    Types: 10 Cans of beer, 3 Shots of liquor per week  . Drug use: Yes    Types: Marijuana    Current Medications and Allergies:    Current Outpatient Medications:  .  albuterol (VENTOLIN HFA) 108 (90 Base) MCG/ACT inhaler, INHALE 1 TO 2 PUFFS BY MOUTH EVERY 4 TO 6 HOURS, Disp: , Rfl:  .  diazepam (VALIUM) 5 MG tablet, Take 1 tablet (5 mg total) by mouth at bedtime., Disp: 30 tablet, Rfl: 2 .  dicyclomine (BENTYL) 10 MG capsule, Take 1 capsule (10 mg total) by mouth 3 (three) times daily as needed (abdominal pain/ cramping)., Disp: 90 capsule, Rfl: 3 .  gabapentin (NEURONTIN) 300 MG capsule, Take 1 capsule (300 mg total) by mouth 3 (three) times daily., Disp: 270 capsule, Rfl: 2 .  mesalamine (APRISO) 0.375 g 24 hr capsule, Take 1 capsule (0.375 g total) by mouth in the morning, at noon, in the evening, and at bedtime., Disp: 360 capsule, Rfl: 3 .  omeprazole (PRILOSEC) 40 MG capsule, Take 1 capsule (40 mg total) by mouth daily., Disp: 90 capsule, Rfl: 3 .  prazosin (MINIPRESS) 1 MG capsule, Take 2 capsules (2 mg total) by mouth at bedtime., Disp: 180 capsule, Rfl: 1 .  vortioxetine HBr (TRINTELLIX) 5 MG TABS tablet, Take 1 tablet (5 mg total) by mouth daily., Disp: 14 tablet, Rfl: 0  No Known Allergies  Review of Systems   ROS Negative unless otherwise specified per HPI.  Vitals:   Vitals:   10/08/20 1531  BP: (!) 144/69  Pulse: 84  Temp: 99 F (37.2 C)  TempSrc: Temporal  SpO2: 98%  Weight: 214 lb (97.1 kg)  Height: 6' (1.829 m)     Body mass index is 29.02 kg/m.   Physical Exam:    Physical Exam Vitals and nursing note reviewed.  Constitutional:      General: He is not in acute distress.    Appearance: He is well-developed. He is not ill-appearing or toxic-appearing.  Cardiovascular:     Rate and Rhythm: Normal rate and regular rhythm.     Pulses: Normal pulses.     Heart sounds: Normal heart sounds,  S1 normal and S2 normal.     Comments: No LE edema Pulmonary:     Effort: Pulmonary effort is normal.     Breath sounds: Normal breath sounds.  Skin:    General: Skin is warm and dry.  Neurological:     Mental Status: He is alert.     GCS: GCS eye subscore is 4. GCS verbal subscore is 5. GCS motor subscore is 6.  Psychiatric:        Speech: Speech normal.  Behavior: Behavior normal. Behavior is cooperative.      Assessment and Plan:    Daniel was seen today for anxiety, depression and hypertension.  Diagnoses and all orders for this visit:  Primary hypertension; Myocardial infarction, unspecified MI type, unspecified artery [facility]) Patient previously saw Dr. Gwenlyn Found. He is requesting a second opinion regarding his cardiac management.  Denies symptoms at this time. Will place referral. -     Ambulatory referral to Cardiology  Anxiety; Moderate episode of recurrent major depressive disorder (HCC) Well controlled. Continue current Trintellix 10 mg daily, valium 5 mg daily, prazosin 2 mg q hs. I discussed with patient that if they develop any SI, to tell someone immediately and seek medical attention. Psychology referral pending. -     Ambulatory referral to Psychology  Lumbar pain Uncontrolled. Refer to physiatry. -     Ambulatory referral to Pain Clinic  Need for immunization against influenza -     Flu Vaccine QUAD 36+ mos IM  CMA or LPN served as scribe during this visit. History, Physical, and Plan performed by medical provider. The above documentation has been reviewed and is accurate and complete.   Inda Coke, PA-C [place] [place] 10/08/2020  Follow-up: No follow-ups on file.

## 2020-10-08 NOTE — Patient Instructions (Signed)
It was great to see you!  Referrals will be placed for a therapist, cardiologist and physiatrist.  I will complete Amanda's FMLA by end of day tomorrow and we will fax it.  Take care,  Inda Coke PA-C

## 2020-10-11 ENCOUNTER — Encounter: Payer: Self-pay | Admitting: Physical Medicine & Rehabilitation

## 2020-10-22 ENCOUNTER — Encounter: Payer: Self-pay | Admitting: Cardiology

## 2020-10-22 ENCOUNTER — Ambulatory Visit: Payer: BC Managed Care – PPO | Admitting: Cardiology

## 2020-10-22 ENCOUNTER — Other Ambulatory Visit: Payer: Self-pay

## 2020-10-22 VITALS — BP 138/82 | HR 91 | Resp 16 | Ht 72.0 in | Wt 214.0 lb

## 2020-10-22 DIAGNOSIS — F17210 Nicotine dependence, cigarettes, uncomplicated: Secondary | ICD-10-CM | POA: Diagnosis not present

## 2020-10-22 DIAGNOSIS — F17290 Nicotine dependence, other tobacco product, uncomplicated: Secondary | ICD-10-CM

## 2020-10-22 DIAGNOSIS — R072 Precordial pain: Secondary | ICD-10-CM | POA: Diagnosis not present

## 2020-10-22 DIAGNOSIS — I10 Essential (primary) hypertension: Secondary | ICD-10-CM

## 2020-10-22 NOTE — Progress Notes (Signed)
Date:  10/22/2020   ID:  Ras, Kollman Jun 26, 1981, MRN [id number]  PCP:  Inda Coke, PA  Cardiologist:  Rex Kras, DO, [facility] (established care 10/22/2020) Former Cardiology Providers: Dr. Donnella Bi  REASON FOR CONSULT: Hypertension, prior cardiac history.   REQUESTING PHYSICIAN:  Inda Coke, [address] [place],  [place] [address]  Chief Complaint  Patient presents with  . Hypertension  . New Patient (Initial Visit)  . Chest Pain    HPI  Daniel Marsh is a 39 y.o. male who presents to the office with a chief complaint of " reevaluation of chest pain and management of hypertension." Patient's past medical history and cardiovascular risk factors include: Hypertension, hyperlipidemia, history of questionable myocardial infarction, active cigarette smoking, active cigar smoking.  He is referred to the office at the request of Inda Coke, [place] for evaluation of reevaluation of chest pain and management of hypertension.  Patient is accompanied by his wife Estill Bamberg and one of his children Alroy Dust at today's office visit.   Patient states that approximately 6 years ago he had a cardiac event and states he had a myocardial infarction while he was in the [employer].  Patient states that he was on medications while he was in service but since then has stopped taking it.  Patient states that he recently moved to [facility] in February 2021 and wanted to reestablish with cardiology.  Since then patient has been seen by Dr. Donnella Bi from [facility] for similar symptoms and underwent a cardiac evaluation including an echocardiogram and coronary CTA.  He now presents for second opinion and possibly reestablishing care.  Patient states that he continues to have sporadic chest pain.  Last episode was approximately 2 weeks ago.  This happened while he was watching TV, substernal discomfort, 7 out of 10, pressure/heaviness to the chest, lasted for about 15  minutes, nonradiating.  Patient states that he is not very functionally active due to back pain and prior surgeries but he does try to do yard work and walks regularly.  The pain is not brought on by effort related activities and does not resolve with resting.  These episodes occurred every 3 to 4 months.  In the recent past he has undergone an echocardiogram which noted preserved LVEF without any significant valvular heart disease.  And coronary CTA noted coronary artery calcification score of 0 and CAD RADS 0.  FUNCTIONAL STATUS: Walks about 1 mile daily. No structured exercise program or daily routine.   ALLERGIES: No Known Allergies  MEDICATION LIST PRIOR TO VISIT: Current Meds  Medication Sig  . albuterol (VENTOLIN HFA) 108 (90 Base) MCG/ACT inhaler INHALE 1 TO 2 PUFFS BY MOUTH EVERY 4 TO 6 HOURS  . celecoxib (CELEBREX) 200 MG capsule Take 200 mg by mouth 2 (two) times daily.  . diazepam (VALIUM) 5 MG tablet Take 1 tablet (5 mg total) by mouth at bedtime.  . dicyclomine (BENTYL) 10 MG capsule Take 1 capsule (10 mg total) by mouth 3 (three) times daily as needed (abdominal pain/ cramping).  . gabapentin (NEURONTIN) 300 MG capsule Take 1 capsule (300 mg total) by mouth 3 (three) times daily.  . mesalamine (APRISO) 0.375 g 24 hr capsule Take 1 capsule (0.375 g total) by mouth in the morning, at noon, in the evening, and at bedtime.  [name] omeprazole (PRILOSEC) 40 MG capsule Take 1 capsule (40 mg total) by mouth daily.  . prazosin (MINIPRESS) 1 MG capsule Take 2 capsules (2 mg total)  by mouth at bedtime.  . vortioxetine HBr (TRINTELLIX) 10 MG TABS tablet Take 1 tablet (10 mg total) by mouth daily.     PAST MEDICAL HISTORY: Past Medical History:  Diagnosis Date  . Anxiety   . Arthritis   . Depression   . Hypertension   . Myocardial infarction ([place])   . PTSD (post-traumatic stress disorder)   . Ulcerative colitis ([place]) 2019    PAST SURGICAL HISTORY: Past Surgical History:  Procedure  Laterality Date  . ANKLE SURGERY Right 2011  . KNEE SURGERY Left 2020  . SPINE SURGERY  2013   Lumbar fusion L4-S1  . WISDOM TOOTH EXTRACTION      FAMILY HISTORY: The patient family history includes Alcohol abuse in his paternal grandmother; COPD in his maternal grandfather and paternal grandmother; Colon cancer in his maternal uncle; Depression in his brother, brother, father, and mother; Diabetes in his maternal grandmother and paternal grandmother; Heart attack in his maternal grandmother, mother, and paternal grandfather; Heart failure in his paternal grandfather; Hyperlipidemia in his maternal grandmother; Hypertension in his maternal grandmother and paternal grandmother; Kidney disease in his maternal grandfather; Lung cancer in his paternal aunt and paternal grandmother; Osteoarthritis in his father, maternal grandfather, mother, and paternal grandfather; Pancreatic cancer in his maternal aunt; Renal cancer in his maternal grandfather.  SOCIAL HISTORY:  The patient  reports that he has been smoking cigarettes and cigars. His smokeless tobacco use includes chew. He reports current alcohol use of about 13.0 standard drinks of alcohol per week. He reports current drug use. Drug: Marijuana.  REVIEW OF SYSTEMS: Review of Systems  Constitutional: Positive for weight loss. Negative for chills and fever.  HENT: Negative for hoarse voice and nosebleeds.   Eyes: Negative for discharge, double vision and pain.  Cardiovascular: Positive for chest pain. Negative for claudication, dyspnea on exertion, leg swelling, near-syncope, orthopnea, palpitations, paroxysmal nocturnal dyspnea and syncope.  Respiratory: Positive for shortness of breath. Negative for hemoptysis.   Musculoskeletal: Negative for muscle cramps and myalgias.  Gastrointestinal: Positive for nausea. Negative for abdominal pain, constipation, diarrhea and vomiting.  Neurological: Negative for dizziness and light-headedness.     PHYSICAL EXAM: Vitals with BMI 10/22/2020 10/08/2020 08/22/2020  Height 6' 0"  6' 0"  6' 0"   Weight 214 lbs 214 lbs 215 lbs  BMI 29.02 93.23 55.73  Systolic 220 254 270  Diastolic 82 69 80  Pulse 91 84 76    CONSTITUTIONAL: Well-developed and well-nourished. No acute distress.  SKIN: Skin is warm and dry. No rash noted. No cyanosis. No pallor. No jaundice HEAD: Normocephalic and atraumatic.  EYES: No scleral icterus MOUTH/THROAT: Moist oral membranes.  NECK: No JVD present. No thyromegaly noted. No carotid bruits  LYMPHATIC: No visible cervical adenopathy.  CHEST Normal respiratory effort. No intercostal retractions  LUNGS: Clear to auscultation bilaterally.  No stridor. No wheezes. No rales.  CARDIOVASCULAR: Regular rate and rhythm, positive S1-S2, no murmurs rubs or gallops appreciated ABDOMINAL: No apparent ascites.  EXTREMITIES: No peripheral edema  HEMATOLOGIC: No significant bruising NEUROLOGIC: Oriented to person, place, and time. Nonfocal. Normal muscle tone.  PSYCHIATRIC: Normal mood and affect. Normal behavior. Cooperative  CARDIAC DATABASE: EKG: 10/22/2020: Normal sinus rhythm, 93 bpm, normal axis, without underlying injury pattern.  Echocardiogram: 06/26/2020: LVEF 55-60%, mild LVH, normal diastolic pattern, trivial MR.   Stress Testing: No results found for this or any previous visit from the past 1095 days.  Coronary CTA: 06/21/2020: Coronary artery calcification score: 0 Normal coronary origin with right dominance.  No evidence of CAD, CADRADS = 0.  LABORATORY DATA: CBC Latest Ref Rng & Units 07/09/2020  WBC 4.0 - 10.5 K/uL 6.4  Hemoglobin 13.0 - 17.0 g/dL 15.6  Hematocrit 39 - 52 % 46.7  Platelets 150 - 400 K/uL 268.0    CMP Latest Ref Rng & Units 06/01/2020  Total Protein 6.0 - 8.5 g/dL 7.0  Total Bilirubin 0.0 - 1.2 mg/dL 0.8  Alkaline Phos 48 - 121 IU/L 63  AST 0 - 40 IU/L 18  ALT 0 - 44 IU/L 11    Lipid Panel     Component Value Date/Time    CHOL 211 (H) 06/01/2020 1014   TRIG 66 06/01/2020 1014   HDL 85 06/01/2020 1014   CHOLHDL 2.5 06/01/2020 1014   LDLCALC 114 (H) 06/01/2020 1014   LABVLDL 12 06/01/2020 1014    No components found for: NTPROBNP No results for input(s): PROBNP in the last 8760 hours. No results for input(s): TSH in the last 8760 hours.  BMP No results for input(s): NA, K, CL, CO2, GLUCOSE, BUN, CREATININE, CALCIUM, GFRNONAA, GFRAA in the last 8760 hours.  HEMOGLOBIN A1C No results found for: HGBA1C, MPG  IMPRESSION:    ICD-10-CM   1. Precordial pain  R07.2   2. Essential hypertension  I10 EKG 12-Lead  3. Cigarette smoker  F17.210   4. Cigar smoker  F17.290      RECOMMENDATIONS: Daniel Marsh is a 39 y.o. male whose past medical history and cardiac risk factors include:  Hypertension,  history of questionable myocardial infarction, active cigarette smoking, active cigar smoking.  Precordial chest pain:  Patient symptoms of chest discomfort has both typical and atypical features.  He has undergone an extensive cardiovascular work-up including an echocardiogram and coronary CTA.  I reviewed the results with him during today's encounter and he emphasized that his coronary calcification was reported to be 0 and no significant epicardial coronary disease per coronary CTA.  In addition his EF is preserved on most recent echocardiogram without any significant valvular heart disease.  Recommended that he focus on lifestyle modifications to improve his risk factors for CAD.  Low-salt diet, glycemic control, complete cessation of cigar and cigarette smoking, increasing physical activity to 30 minutes a day 5 days a week of moderate intensity activity as tolerated.  Benign essential hypertension:  Patient states that while he was in the service he was on [employer] but no longer on it.  I do not have a blood pressure log for review.  His office blood pressures are consistent with stage I  hypertension.  Educated on the nonpharmacological therapy as it is class I indication at this time.  Unable to calculate his estimated 10-year risk of ASCVD given his young age.  Low-salt diet recommended.    Educated on looking into the DASH diet and plant-based diet.  Patient is asked to keep a log of his blood pressures and to bring it in at the next office visit.  Active cigar/cigarette smoking: Educated on importance of complete cessation.  Patient states that he recently has lost significant amount of weight over a short period of time.  I have asked him to discuss this further with his PCP and have appropriate cancer screening given his extensive cigar and cigarette smoking.  Patient also has a history of this serving in the [employer] and per EMR also incarcerated may consider infectious disease work-up as well if clinically indicated.  Patient may follow-up with either myself or Dr. Donnella Bi  as per his wishes.  FINAL MEDICATION LIST END OF ENCOUNTER: No orders of the defined types were placed in this encounter.   There are no discontinued medications.   Current Outpatient Medications:  .  albuterol (VENTOLIN HFA) 108 (90 Base) MCG/ACT inhaler, INHALE 1 TO 2 PUFFS BY MOUTH EVERY 4 TO 6 HOURS, Disp: , Rfl:  .  celecoxib (CELEBREX) 200 MG capsule, Take 200 mg by mouth 2 (two) times daily., Disp: , Rfl:  .  diazepam (VALIUM) 5 MG tablet, Take 1 tablet (5 mg total) by mouth at bedtime., Disp: 30 tablet, Rfl: 2 .  dicyclomine (BENTYL) 10 MG capsule, Take 1 capsule (10 mg total) by mouth 3 (three) times daily as needed (abdominal pain/ cramping)., Disp: 90 capsule, Rfl: 3 .  gabapentin (NEURONTIN) 300 MG capsule, Take 1 capsule (300 mg total) by mouth 3 (three) times daily., Disp: 270 capsule, Rfl: 2 .  mesalamine (APRISO) 0.375 g 24 hr capsule, Take 1 capsule (0.375 g total) by mouth in the morning, at noon, in the evening, and at bedtime., Disp: 360 capsule, Rfl: 3 .  omeprazole  (PRILOSEC) 40 MG capsule, Take 1 capsule (40 mg total) by mouth daily., Disp: 90 capsule, Rfl: 3 .  prazosin (MINIPRESS) 1 MG capsule, Take 2 capsules (2 mg total) by mouth at bedtime., Disp: 180 capsule, Rfl: 1 .  vortioxetine HBr (TRINTELLIX) 10 MG TABS tablet, Take 1 tablet (10 mg total) by mouth daily., Disp: 90 tablet, Rfl: 1  Orders Placed This Encounter  Procedures  . EKG 12-Lead    There are no Patient Instructions on file for this visit.   --Continue cardiac medications as reconciled in final medication list. --Return in about 4 weeks (around 11/19/2020) for Follow up, BP. Or sooner if needed. --Continue follow-up with your primary care physician regarding the management of your other chronic comorbid conditions.  Patient's questions and concerns were addressed to his satisfaction. He voices understanding of the instructions provided during this encounter.   This note was created using a voice recognition software as a result there may be grammatical errors inadvertently enclosed that do not reflect the nature of this encounter. Every attempt is made to correct such errors.  Total encounter time 45 minutes. *Total Encounter Time as defined by the Centers for Medicare and Medicaid Services includes, in addition to the face-to-face time of a patient visit (documented in the note above) non-face-to-face time: obtaining and reviewing outside history, ordering and reviewing medications, tests or procedures, care coordination (communications with other health care professionals or caregivers) and documentation in the medical record.  Rex Kras, [place], [facility]  Pager: [phone] Office: [phone]

## 2020-10-30 ENCOUNTER — Ambulatory Visit (INDEPENDENT_AMBULATORY_CARE_PROVIDER_SITE_OTHER): Payer: BC Managed Care – PPO | Admitting: Psychologist

## 2020-10-30 DIAGNOSIS — F331 Major depressive disorder, recurrent, moderate: Secondary | ICD-10-CM

## 2020-10-30 DIAGNOSIS — F1010 Alcohol abuse, uncomplicated: Secondary | ICD-10-CM

## 2020-10-30 DIAGNOSIS — F4310 Post-traumatic stress disorder, unspecified: Secondary | ICD-10-CM | POA: Diagnosis not present

## 2020-10-30 DIAGNOSIS — Z634 Disappearance and death of family member: Secondary | ICD-10-CM

## 2020-11-05 ENCOUNTER — Encounter: Payer: Self-pay | Admitting: Physical Medicine & Rehabilitation

## 2020-11-05 ENCOUNTER — Encounter
Payer: BC Managed Care – PPO | Attending: Physical Medicine & Rehabilitation | Admitting: Physical Medicine & Rehabilitation

## 2020-11-05 ENCOUNTER — Other Ambulatory Visit: Payer: Self-pay

## 2020-11-05 VITALS — BP 144/82 | HR 68 | Temp 98.5°F | Ht 72.0 in | Wt 216.4 lb

## 2020-11-05 DIAGNOSIS — G8929 Other chronic pain: Secondary | ICD-10-CM | POA: Diagnosis not present

## 2020-11-05 DIAGNOSIS — G479 Sleep disorder, unspecified: Secondary | ICD-10-CM

## 2020-11-05 DIAGNOSIS — R269 Unspecified abnormalities of gait and mobility: Secondary | ICD-10-CM | POA: Diagnosis not present

## 2020-11-05 DIAGNOSIS — F4310 Post-traumatic stress disorder, unspecified: Secondary | ICD-10-CM

## 2020-11-05 DIAGNOSIS — M5441 Lumbago with sciatica, right side: Secondary | ICD-10-CM

## 2020-11-05 MED ORDER — GABAPENTIN 600 MG PO TABS: 600.0000 mg | ORAL_TABLET | Freq: Three times a day (TID) | ORAL | 1 refills | Status: DC

## 2020-11-05 NOTE — Progress Notes (Signed)
Subjective:    Patient ID: Daniel Marsh, male    DOB: [birth year], 39 y.o.   MRN: [id number]  HPI Patient with pmh/psh of UC, PTSD, MI, HTN, OA, anxiety/depression, right ankle surgery, L4-S1 lumbar fusion presents with low back pain. Back pain started in 2012 after he was moving an heavy object fracturing bones. He had fusion afterward. Since that time pain has been getting worse. TENS unit improves the pain. Located central lower back. Denies alleviating factors. Cannot identify exacerbating factors.  All qualities of pain - pin/needles worst.  Radiates to right toes an left knee.  Associated LLE numbness.  Has associated bowel/bladder incontinence. Denies falls. He moved to [place] from [facility]. He saw Sports Med and had steroid injection in his left knee and told her has left ACL and right meniscal tear.  He would like to be more active. Last fall 03/2020 with legs "giving out".  Pain limits activity.   Pain Inventory Average Pain 8 Pain Right Now 8 My pain is intermittent, constant, burning, dull, stabbing, tingling and aching  In the last 24 hours, has pain interfered with the following? General activity 8 Relation with others 6 Enjoyment of life 10 What TIME of day is your pain at its worst? daytime and evening Sleep (in general) Poor  Pain is worse with: walking, bending, sitting, inactivity, standing and some activites Pain improves with: medication, TENS and injections Relief from Meds: 2  walk without assistance walk with assistance use a cane ability to climb steps?  yes do you drive?  yes  not employed: date last employed .  bladder control problems weakness numbness tremor tingling trouble walking spasms dizziness confusion depression anxiety suicidal thoughts  New pt  New pt    Family History  Problem Relation Age of Onset  . Osteoarthritis Mother   . Depression Mother   . Heart attack Mother   . Osteoarthritis Father   .  Depression Father   . Depression Brother   . Diabetes Maternal Grandmother   . Hyperlipidemia Maternal Grandmother   . Hypertension Maternal Grandmother   . Heart attack Maternal Grandmother   . Osteoarthritis Maternal Grandfather   . COPD Maternal Grandfather   . Kidney disease Maternal Grandfather   . Renal cancer Maternal Grandfather   . Alcohol abuse Paternal Grandmother   . COPD Paternal Grandmother   . Diabetes Paternal Grandmother   . Hypertension Paternal Grandmother   . Lung cancer Paternal Grandmother   . Osteoarthritis Paternal Grandfather   . Heart attack Paternal Grandfather   . Heart failure Paternal Grandfather   . Depression Brother   . Pancreatic cancer Maternal Aunt   . Lung cancer Paternal Aunt   . Colon cancer Maternal Uncle    Social History   Socioeconomic History  . Marital status: Married    Spouse name: Not on file  . Number of children: 3  . Years of education: Not on file  . Highest education level: Not on file  Occupational History  . Not on file  Tobacco Use  . Smoking status: Current Some Day Smoker    Types: Cigarettes, Cigars  . Smokeless tobacco: Current User    Types: Chew  Vaping Use  . Vaping Use: Never used  Substance and Sexual Activity  . Alcohol use: Yes    Alcohol/week: 13.0 standard drinks    Types: 10 Cans of beer, 3 Shots of liquor per week  . Drug use: Yes    Types: Marijuana  Comment: occ  . Sexual activity: Yes  Other Topics Concern  . Not on file  Social History Narrative   Was in the [employer] for 7 years, required lumbar surgery while in the [employer] and had poor outcome   Divorced and re-married   24 yo son   Social Determinants of Radio broadcast assistant Strain:   . Difficulty of Paying Living Expenses: Not on file  Food Insecurity:   . Worried About Charity fundraiser in the Last Year: Not on file  . Ran Out of Food in the Last Year: Not on file  Transportation Needs:   . Lack of Transportation  (Medical): Not on file  . Lack of Transportation (Non-Medical): Not on file  Physical Activity:   . Days of Exercise per Week: Not on file  . Minutes of Exercise per Session: Not on file  Stress:   . Feeling of Stress : Not on file  Social Connections:   . Frequency of Communication with Friends and Family: Not on file  . Frequency of Social Gatherings with Friends and Family: Not on file  . Attends Religious Services: Not on file  . Active Member of Clubs or Organizations: Not on file  . Attends Archivist Meetings: Not on file  . Marital Status: Not on file   Past Surgical History:  Procedure Laterality Date  . ANKLE SURGERY Right 2011  . KNEE SURGERY Left 2020  . SPINE SURGERY  2013   Lumbar fusion L4-S1  . WISDOM TOOTH EXTRACTION     Past Medical History:  Diagnosis Date  . Anxiety   . Arthritis   . Depression   . Hypertension   . Myocardial infarction ([place])   . PTSD (post-traumatic stress disorder)   . Ulcerative colitis ([place]) 2019   BP (!) 144/82   Pulse 68   Temp 98.5 F (36.9 C)   Ht 6' (1.829 m)   Wt 216 lb 6.4 oz (98.2 kg)   SpO2 98%   BMI 29.35 kg/m   Opioid Risk Score:   Fall Risk Score:  `1  Depression screen PHQ 2/9  Depression screen [facility] 2/9 11/05/2020 10/08/2020 05/30/2020  Decreased Interest 3 3 2   Down, Depressed, Hopeless 3 3 3   PHQ - 2 Score 6 6 5   Altered sleeping 3 1 3   Tired, decreased energy 3 3 2   Change in appetite 3 3 3   Feeling bad or failure about yourself  3 3 2   Trouble concentrating 3 3 2   Moving slowly or fidgety/restless 2 2 2   Suicidal thoughts 1 1 1   PHQ-9 Score 24 22 20   Difficult doing work/chores Very difficult Very difficult Somewhat difficult   Review of Systems  Constitutional:       Night sweats  Gastrointestinal:       Incontinence  Genitourinary:       Incontinence  Musculoskeletal: Positive for arthralgias, back pain, gait problem, myalgias and neck pain.       Wrist pain Knee pain Ankle pain   Neurological: Positive for dizziness, tremors, weakness, numbness and headaches.  Psychiatric/Behavioral: Positive for sleep disturbance.  All other systems reviewed and are negative.     Objective:   Physical Exam Constitutional: No distress . Vital signs reviewed. HENT: Normocephalic.  Atraumatic. Eyes: EOMI. No discharge. Cardiovascular: No JVD.   Respiratory: Normal effort.  No stridor.   GI: Non-distended.   Skin: Warm and dry.  Intact. Psych: Normal mood.  Normal behavior. Musc:  No edema in extremities.   TTP lower back Atrophy of core muscles Gait: Able to toe/heel walk Pes planus b/l Neuro: Alert Sensation intact to light touch b/l LE Motor: B/l LE: HF 4-/5, KE 4--4/5, ADF 4+/5 (some pain inhibition)    Assessment & Plan:  Patient with pmh/psh of UC, PTSD, MI, HTN, OA, anxiety/depression, right ankle surgery, L4-S1 lumbar fusion presents with low back pain. Back pain started in 2012 after he was moving an heavy object fracturing bones.   1. Chronic mechanical low back pain  Will order MRI given bowel/bladder incontinence, nightsweats  Limited labs available for review  Referral information reviewed - back pain  PMAWARE reviewed  No benefit with heat/cold  Will try to avoid NSAID due to UC  Will consider PT after imaging with trial of TENS  Recommend trial of OTC lidoderm patch  Will increase Gabapentin 681m TID  Will consider Cymbalta  Will consider Robaxin   Will consider referral to Psychology  Will consider accupuncture  Patient states main goal is to be more active   2. Gait abnormality  Continue cane for safety  3. Sleep disturbance - due to pain +PTSD + depression  See #1  Will consider Elavil  4. Myalgia   Will consider trigger point injections

## 2020-11-06 ENCOUNTER — Ambulatory Visit (INDEPENDENT_AMBULATORY_CARE_PROVIDER_SITE_OTHER): Payer: BC Managed Care – PPO | Admitting: Psychologist

## 2020-11-06 DIAGNOSIS — F331 Major depressive disorder, recurrent, moderate: Secondary | ICD-10-CM

## 2020-11-06 DIAGNOSIS — Z634 Disappearance and death of family member: Secondary | ICD-10-CM

## 2020-11-06 DIAGNOSIS — F4310 Post-traumatic stress disorder, unspecified: Secondary | ICD-10-CM

## 2020-11-06 DIAGNOSIS — F1010 Alcohol abuse, uncomplicated: Secondary | ICD-10-CM | POA: Diagnosis not present

## 2020-11-15 ENCOUNTER — Encounter: Payer: Self-pay | Admitting: Physician Assistant

## 2020-11-16 ENCOUNTER — Telehealth: Payer: Self-pay | Admitting: Cardiology

## 2020-11-17 ENCOUNTER — Emergency Department (HOSPITAL_COMMUNITY)
Admission: EM | Admit: 2020-11-17 | Discharge: 2020-11-17 | Discharge status: Absent without leave | Payer: BC Managed Care – PPO

## 2020-11-17 ENCOUNTER — Other Ambulatory Visit: Payer: Self-pay

## 2020-11-17 ENCOUNTER — Ambulatory Visit (HOSPITAL_COMMUNITY)
Admission: RE | Admit: 2020-11-17 | Discharge: 2020-11-17 | Disposition: A | Discharge status: Home / Self Care | Payer: BC Managed Care – PPO | Source: Ambulatory Visit | Attending: Physical Medicine & Rehabilitation | Admitting: Physical Medicine & Rehabilitation

## 2020-11-17 DIAGNOSIS — G8929 Other chronic pain: Secondary | ICD-10-CM | POA: Insufficient documentation

## 2020-11-17 DIAGNOSIS — M5441 Lumbago with sciatica, right side: Secondary | ICD-10-CM | POA: Insufficient documentation

## 2020-11-17 DIAGNOSIS — M5126 Other intervertebral disc displacement, lumbar region: Secondary | ICD-10-CM | POA: Diagnosis not present

## 2020-11-17 MED ORDER — GADOBUTROL 1 MMOL/ML IV SOLN
10.0000 mL | Freq: Once | INTRAVENOUS | Status: AC | PRN
  Administered 2020-11-17: 10 mL via INTRAVENOUS

## 2020-11-19 ENCOUNTER — Ambulatory Visit (INDEPENDENT_AMBULATORY_CARE_PROVIDER_SITE_OTHER): Payer: BC Managed Care – PPO | Admitting: Psychologist

## 2020-11-19 DIAGNOSIS — F1010 Alcohol abuse, uncomplicated: Secondary | ICD-10-CM | POA: Diagnosis not present

## 2020-11-19 DIAGNOSIS — F4310 Post-traumatic stress disorder, unspecified: Secondary | ICD-10-CM | POA: Diagnosis not present

## 2020-11-19 DIAGNOSIS — Z634 Disappearance and death of family member: Secondary | ICD-10-CM

## 2020-11-19 DIAGNOSIS — F331 Major depressive disorder, recurrent, moderate: Secondary | ICD-10-CM

## 2020-11-20 ENCOUNTER — Ambulatory Visit: Payer: BC Managed Care – PPO | Admitting: Cardiology

## 2020-11-21 ENCOUNTER — Other Ambulatory Visit: Payer: Self-pay

## 2020-11-21 ENCOUNTER — Ambulatory Visit: Payer: BC Managed Care – PPO | Admitting: Physician Assistant

## 2020-11-21 ENCOUNTER — Encounter: Payer: Self-pay | Admitting: Physician Assistant

## 2020-11-21 VITALS — BP 121/75 | HR 67 | Temp 98.6°F | Ht 72.0 in | Wt 213.8 lb

## 2020-11-21 DIAGNOSIS — R634 Abnormal weight loss: Secondary | ICD-10-CM | POA: Diagnosis not present

## 2020-11-21 NOTE — Addendum Note (Signed)
Addended by: Brandy Hale on: 11/21/2020 02:51 PM   Modules accepted: Orders

## 2020-11-21 NOTE — Progress Notes (Signed)
Daniel Marsh is a 39 y.o. male here for a follow up of a pre-existing problem.   History of Present Illness:   Chief Complaint  Patient presents with  . Follow-up    Information from Cardiovascular     HPI   Unintentional weight loss Was around 260 lb in Feb 2021 and is now down to 213 lb. Reports reduced appetite since December 2020. Eats at least 1 meal per day, sometimes two. Doesn't do much physical activity due to chronic pain. Has had rectal bleeding with his UC, and recent colonoscopy was in Aug 2021.   Recent cardiology visit with Dr. Terri Skains. During that visit discussed with patient about his weight loss, night sweats, rectal bleeding, etc. Cardiology recommended follow-up with [place] for possible "lymphoma work-up."  Has significant family hx of cancer and also notes some autoimmune issues as well.  Did have CT of coronaries in July 2021 that did not find any non-cardiac findings (lung nodules, masses, etc.)  Wt Readings from Last 30 Encounters:  11/21/20 213 lb 12.8 oz (97 kg)  11/05/20 216 lb 6.4 oz (98.2 kg)  10/22/20 214 lb (97.1 kg)  10/08/20 214 lb (97.1 kg)  08/22/20 215 lb (97.5 kg)  07/17/20 214 lb (97.1 kg)  07/10/20 214 lb 6.4 oz (97.3 kg)  07/09/20 214 lb 2 oz (97.1 kg)  06/05/20 219 lb 12.8 oz (99.7 kg)  06/01/20 214 lb (97.1 kg)  05/30/20 219 lb (99.3 kg)  04/13/20 231 lb 7.7 oz (105 kg)     Past Medical History:  Diagnosis Date  . Anxiety   . Arthritis   . Depression   . Hypertension   . Myocardial infarction ([place])   . PTSD (post-traumatic stress disorder)   . Ulcerative colitis ([place]) 2019     Social History   Tobacco Use  . Smoking status: Current Some Day Smoker    Types: Cigarettes, Cigars  . Smokeless tobacco: Current User    Types: Chew  Vaping Use  . Vaping Use: Never used  Substance Use Topics  . Alcohol use: Yes    Alcohol/week: 13.0 standard drinks    Types: 10 Cans of beer, 3 Shots of liquor per week  . Drug use: Yes     Types: Marijuana    Comment: occ    Past Surgical History:  Procedure Laterality Date  . ANKLE SURGERY Right 2011  . KNEE SURGERY Left 2020  . SPINE SURGERY  2013   Lumbar fusion L4-S1  . WISDOM TOOTH EXTRACTION      Family History  Problem Relation Age of Onset  . Osteoarthritis Mother   . Depression Mother   . Heart attack Mother   . Osteoarthritis Father   . Depression Father   . Depression Brother   . Diabetes Maternal Grandmother   . Hyperlipidemia Maternal Grandmother   . Hypertension Maternal Grandmother   . Heart attack Maternal Grandmother   . Osteoarthritis Maternal Grandfather   . COPD Maternal Grandfather   . Kidney disease Maternal Grandfather   . Renal cancer Maternal Grandfather   . Alcohol abuse Paternal Grandmother   . COPD Paternal Grandmother   . Diabetes Paternal Grandmother   . Hypertension Paternal Grandmother   . Lung cancer Paternal Grandmother   . Osteoarthritis Paternal Grandfather   . Heart attack Paternal Grandfather   . Heart failure Paternal Grandfather   . Depression Brother   . Pancreatic cancer Maternal Aunt   . Lung cancer Paternal Aunt   .  Colon cancer Maternal Uncle     No Known Allergies  Current Medications:   Current Outpatient Medications:  .  diazepam (VALIUM) 5 MG tablet, Take 1 tablet (5 mg total) by mouth at bedtime., Disp: 30 tablet, Rfl: 2 .  dicyclomine (BENTYL) 10 MG capsule, Take 1 capsule (10 mg total) by mouth 3 (three) times daily as needed (abdominal pain/ cramping)., Disp: 90 capsule, Rfl: 3 .  gabapentin (NEURONTIN) 600 MG tablet, Take 1 tablet (600 mg total) by mouth 3 (three) times daily., Disp: 90 tablet, Rfl: 1 .  mesalamine (APRISO) 0.375 g 24 hr capsule, Take 1 capsule (0.375 g total) by mouth in the morning, at noon, in the evening, and at bedtime., Disp: 360 capsule, Rfl: 3 .  omeprazole (PRILOSEC) 40 MG capsule, Take 1 capsule (40 mg total) by mouth daily., Disp: 90 capsule, Rfl: 3 .  prazosin  (MINIPRESS) 1 MG capsule, Take 2 capsules (2 mg total) by mouth at bedtime., Disp: 180 capsule, Rfl: 1 .  vortioxetine HBr (TRINTELLIX) 10 MG TABS tablet, Take 1 tablet (10 mg total) by mouth daily., Disp: 90 tablet, Rfl: 1   Review of Systems:   ROS  Negative unless otherwise specified per HPI.  Vitals:   Vitals:   11/21/20 1405  BP: 121/75  Pulse: 67  Temp: 98.6 F (37 C)  TempSrc: Temporal  SpO2: 99%  Weight: 213 lb 12.8 oz (97 kg)  Height: 6' (1.829 m)     Body mass index is 29 kg/m.  Physical Exam:   Physical Exam Vitals and nursing note reviewed.  Constitutional:      General: He is not in acute distress.    Appearance: He is well-developed. He is not ill-appearing, toxic-appearing or sickly-appearing.  Cardiovascular:     Rate and Rhythm: Normal rate and regular rhythm.     Pulses: Normal pulses.     Heart sounds: Normal heart sounds, S1 normal and S2 normal.     Comments: No LE edema Pulmonary:     Effort: Pulmonary effort is normal.     Breath sounds: Normal breath sounds.  Skin:    General: Skin is warm, dry and intact.  Neurological:     Mental Status: He is alert.     GCS: GCS eye subscore is 4. GCS verbal subscore is 5. GCS motor subscore is 6.  Psychiatric:        Mood and Affect: Mood and affect normal.        Speech: Speech normal.        Behavior: Behavior normal. Behavior is cooperative.      Assessment and Plan:   Daniel was seen today for follow-up.  Diagnoses and all orders for this visit:  Recent unintentional weight loss over several months Unclear etiology of weight loss. Will obtain labs and CT abd/pel. Further work-up/intervention based on results and clinical symptoms. -     CBC with Differential/Platelet; Future -     Comprehensive metabolic panel; Future -     TSH; Future -     HIV Antibody (routine testing w rflx); Future -     RPR; Future -     Hepatitis C Antibody; Future -     Rheumatoid factor; Future -      ANA; Future -     Cyclic citrul peptide antibody, IgG; Future -     C-reactive protein; Future -     Urinalysis, Routine w reflex microscopic; Future -     Sedimentation  rate; Future -     Hemoglobin A1c; Future -     CT Abdomen Pelvis W Contrast; Future  CMA or LPN served as scribe during this visit. History, Physical, and Plan performed by medical provider. The above documentation has been reviewed and is accurate and complete.  Inda Coke, PA-C

## 2020-11-21 NOTE — Patient Instructions (Signed)
It was great to see you!  Recommend trial of OTC lidoderm patch Consideration of using TENS patch Reach out to Dr. [name] about pain control (trigger injections, PT referral, dry needling, etc)  Update blood work today to further evaluate weight loss.  We will be in touch with your appointment for imaging.  Take care,  Inda Coke PA-C

## 2020-11-22 LAB — ANA: Anti Nuclear Antibody (ANA): NEGATIVE

## 2020-11-22 LAB — RPR: RPR Ser Ql: NONREACTIVE

## 2020-11-22 LAB — URINALYSIS, ROUTINE W REFLEX MICROSCOPIC
Bilirubin Urine: NEGATIVE
Glucose, UA: NEGATIVE
Hgb urine dipstick: NEGATIVE
Ketones, ur: NEGATIVE
Leukocytes,Ua: NEGATIVE
Nitrite: NEGATIVE
Protein, ur: NEGATIVE
Specific Gravity, Urine: 1.01 (ref 1.001–1.03)
pH: 6 (ref 5.0–8.0)

## 2020-11-22 LAB — TSH: TSH: 0.72 mIU/L (ref 0.40–4.50)

## 2020-11-22 LAB — HEPATITIS C ANTIBODY
Hepatitis C Ab: NONREACTIVE
SIGNAL TO CUT-OFF: 0.03 (ref <1.00)

## 2020-11-22 LAB — COMPREHENSIVE METABOLIC PANEL
AG Ratio: 2.1 (calc) (ref 1.0–2.5)
ALT: 10 U/L (ref 9–46)
AST: 15 U/L (ref 10–40)
Albumin: 4.7 g/dL (ref 3.6–5.1)
Alkaline phosphatase (APISO): 53 U/L (ref 36–130)
BUN: 8 mg/dL (ref 7–25)
CO2: 28 mmol/L (ref 20–32)
Calcium: 9.6 mg/dL (ref 8.6–10.3)
Chloride: 106 mmol/L (ref 98–110)
Creat: 0.97 mg/dL (ref 0.60–1.35)
Globulin: 2.2 g/dL (calc) (ref 1.9–3.7)
Glucose, Bld: 84 mg/dL (ref 65–99)
Potassium: 4.5 mmol/L (ref 3.5–5.3)
Sodium: 142 mmol/L (ref 135–146)
Total Bilirubin: 0.5 mg/dL (ref 0.2–1.2)
Total Protein: 6.9 g/dL (ref 6.1–8.1)

## 2020-11-22 LAB — SEDIMENTATION RATE: Sed Rate: 2 mm/h (ref 0–15)

## 2020-11-22 LAB — CBC WITH DIFFERENTIAL/PLATELET
Absolute Monocytes: 449 cells/uL (ref 200–950)
Basophils Absolute: 20 cells/uL (ref 0–200)
Basophils Relative: 0.4 %
Eosinophils Absolute: 20 cells/uL (ref 15–500)
Eosinophils Relative: 0.4 %
HCT: 45.9 % (ref 38.5–50.0)
Hemoglobin: 15 g/dL (ref 13.2–17.1)
Lymphs Abs: 1244 cells/uL (ref 850–3900)
MCH: 29.9 pg (ref 27.0–33.0)
MCHC: 32.7 g/dL (ref 32.0–36.0)
MCV: 91.4 fL (ref 80.0–100.0)
MPV: 10.9 fL (ref 7.5–12.5)
Monocytes Relative: 8.8 %
Neutro Abs: 3366 cells/uL (ref 1500–7800)
Neutrophils Relative %: 66 %
Platelets: 249 10*3/uL (ref 140–400)
RBC: 5.02 10*6/uL (ref 4.20–5.80)
RDW: 14.1 % (ref 11.0–15.0)
Total Lymphocyte: 24.4 %
WBC: 5.1 10*3/uL (ref 3.8–10.8)

## 2020-11-22 LAB — HEMOGLOBIN A1C
Hgb A1c MFr Bld: 5.2 % of total Hgb (ref <5.7)
Mean Plasma Glucose: 103 mg/dL
eAG (mmol/L): 5.7 mmol/L

## 2020-11-22 LAB — C-REACTIVE PROTEIN: CRP: 2.5 mg/L (ref <8.0)

## 2020-11-22 LAB — HIV ANTIBODY (ROUTINE TESTING W REFLEX): HIV 1&2 Ab, 4th Generation: NONREACTIVE

## 2020-11-22 LAB — RHEUMATOID FACTOR: Rheumatoid fact SerPl-aCnc: <14 IU/mL (ref <14)

## 2020-11-22 LAB — CYCLIC CITRUL PEPTIDE ANTIBODY, IGG: Cyclic Citrullin Peptide Ab: <16 UNITS

## 2020-11-26 ENCOUNTER — Other Ambulatory Visit: Payer: Self-pay

## 2020-11-26 NOTE — Telephone Encounter (Signed)
Left pt a VM letting him know that the CT for tomorrow is canceled-location not approved

## 2020-11-27 ENCOUNTER — Encounter (HOSPITAL_COMMUNITY): Payer: Self-pay

## 2020-11-27 ENCOUNTER — Ambulatory Visit (HOSPITAL_COMMUNITY)
Admission: RE | Admit: 2020-11-27 | Discharge: 2020-11-27 | Disposition: A | Discharge status: Home / Self Care | Payer: BC Managed Care – PPO | Source: Ambulatory Visit | Attending: Physician Assistant | Admitting: Physician Assistant

## 2020-11-27 ENCOUNTER — Other Ambulatory Visit: Payer: Self-pay

## 2020-11-27 DIAGNOSIS — R634 Abnormal weight loss: Secondary | ICD-10-CM | POA: Diagnosis not present

## 2020-11-27 DIAGNOSIS — R1110 Vomiting, unspecified: Secondary | ICD-10-CM | POA: Diagnosis not present

## 2020-11-27 DIAGNOSIS — R109 Unspecified abdominal pain: Secondary | ICD-10-CM | POA: Diagnosis not present

## 2020-11-27 DIAGNOSIS — R112 Nausea with vomiting, unspecified: Secondary | ICD-10-CM | POA: Diagnosis not present

## 2020-11-27 MED ORDER — IOHEXOL 300 MG/ML  SOLN
100.0000 mL | Freq: Once | INTRAMUSCULAR | Status: AC | PRN
  Administered 2020-11-27: 16:00:00 100 mL via INTRAVENOUS

## 2020-12-06 ENCOUNTER — Encounter: Payer: Self-pay | Admitting: Physical Medicine & Rehabilitation

## 2020-12-06 ENCOUNTER — Encounter
Payer: BC Managed Care – PPO | Attending: Physical Medicine & Rehabilitation | Admitting: Physical Medicine & Rehabilitation

## 2020-12-06 ENCOUNTER — Other Ambulatory Visit: Payer: Self-pay

## 2020-12-06 VITALS — BP 144/83 | HR 77 | Temp 98.6°F | Ht 72.0 in | Wt 208.2 lb

## 2020-12-06 DIAGNOSIS — M961 Postlaminectomy syndrome, not elsewhere classified: Secondary | ICD-10-CM | POA: Diagnosis not present

## 2020-12-06 DIAGNOSIS — M47817 Spondylosis without myelopathy or radiculopathy, lumbosacral region: Secondary | ICD-10-CM

## 2020-12-06 DIAGNOSIS — G479 Sleep disorder, unspecified: Secondary | ICD-10-CM

## 2020-12-06 DIAGNOSIS — G8929 Other chronic pain: Secondary | ICD-10-CM | POA: Diagnosis not present

## 2020-12-06 DIAGNOSIS — M2141 Flat foot [pes planus] (acquired), right foot: Secondary | ICD-10-CM

## 2020-12-06 DIAGNOSIS — M5441 Lumbago with sciatica, right side: Secondary | ICD-10-CM | POA: Diagnosis not present

## 2020-12-06 DIAGNOSIS — M2142 Flat foot [pes planus] (acquired), left foot: Secondary | ICD-10-CM | POA: Diagnosis not present

## 2020-12-06 DIAGNOSIS — M47816 Spondylosis without myelopathy or radiculopathy, lumbar region: Secondary | ICD-10-CM

## 2020-12-06 DIAGNOSIS — R269 Unspecified abnormalities of gait and mobility: Secondary | ICD-10-CM

## 2020-12-06 MED ORDER — GABAPENTIN 600 MG PO TABS: 1200.0000 mg | ORAL_TABLET | Freq: Three times a day (TID) | ORAL | 1 refills | Status: DC

## 2020-12-06 MED ORDER — AMITRIPTYLINE HCL 10 MG PO TABS: 10.0000 mg | ORAL_TABLET | Freq: Every day | ORAL | 1 refills | Status: DC

## 2020-12-06 NOTE — Progress Notes (Signed)
Subjective:    Patient ID: Daniel Marsh, male    DOB: Sep 05, 1981, 40 y.o.   MRN: [id number]  HPI Patient with pmh/psh of UC, PTSD, MI, HTN, OA, anxiety/depression, right ankle surgery, L4-S1 lumbar fusion presents with low back pain.  Initially stated:  Back pain started in 2012 after he was moving an heavy object fracturing bones. He had fusion afterward. Since that time pain has been getting worse. TENS unit improves the pain. Located central lower back. Denies alleviating factors. Cannot identify exacerbating factors.  All qualities of pain - pin/needles worst.  Radiates to right toes an left knee.  Associated LLE numbness.  Has associated bowel/bladder incontinence. Denies falls. He moved to [place] from [facility]. He saw Sports Med and had steroid injection in his left knee and told her has left ACL and right meniscal tear.  He would like to be more active. Last fall 03/2020 with legs "giving out".  Pain limits activity.   Last clinic visit on 11/05/20.  Since that time, pt states he was worked up for Lymphoma.  He had an MRI, which showed unclear hypodensity.  He obtained MRI as well. He tried Lidocaine patch with temporary pain relief. He is not sure if had benefit with increase in Gabapentin.  He had a fall when he stood up and tried to take a step, he did not use his cane.  He did not bring his cane.  Pain Inventory Average Pain 8 Pain Right Now 8 My pain is intermittent, constant, burning, dull, stabbing, tingling and aching  In the last 24 hours, has pain interfered with the following? General activity 8 Relation with others 8 Enjoyment of life 8 What TIME of day is your pain at its worst? morning , daytime, evening and night Sleep (in general) Poor  Pain is worse with: walking, bending, sitting and standing Pain improves with: rest, heat/ice, therapy/exercise, medication, TENS and injections Relief from Meds: 2     Family History  Problem Relation Age of Onset   . Osteoarthritis Mother   . Depression Mother   . Heart attack Mother   . Osteoarthritis Father   . Depression Father   . Depression Brother   . Diabetes Maternal Grandmother   . Hyperlipidemia Maternal Grandmother   . Hypertension Maternal Grandmother   . Heart attack Maternal Grandmother   . Osteoarthritis Maternal Grandfather   . COPD Maternal Grandfather   . Kidney disease Maternal Grandfather   . Renal cancer Maternal Grandfather   . Alcohol abuse Paternal Grandmother   . COPD Paternal Grandmother   . Diabetes Paternal Grandmother   . Hypertension Paternal Grandmother   . Lung cancer Paternal Grandmother   . Osteoarthritis Paternal Grandfather   . Heart attack Paternal Grandfather   . Heart failure Paternal Grandfather   . Depression Brother   . Pancreatic cancer Maternal Aunt   . Lung cancer Paternal Aunt   . Colon cancer Maternal Uncle    Social History   Socioeconomic History  . Marital status: Married    Spouse name: Not on file  . Number of children: 3  . Years of education: Not on file  . Highest education level: Not on file  Occupational History  . Not on file  Tobacco Use  . Smoking status: Current Some Day Smoker    Types: Cigarettes, Cigars  . Smokeless tobacco: Current User    Types: Chew  Vaping Use  . Vaping Use: Never used  Substance and Sexual Activity  .  Alcohol use: Yes    Alcohol/week: 13.0 standard drinks    Types: 10 Cans of beer, 3 Shots of liquor per week  . Drug use: Yes    Types: Marijuana    Comment: occ  . Sexual activity: Yes  Other Topics Concern  . Not on file  Social History Narrative   Was in the [employer] for 7 years, required lumbar surgery while in the [employer] and had poor outcome   Divorced and re-married   44 yo son   Social Determinants of Radio broadcast assistant Strain: Not on file  Food Insecurity: Not on file  Transportation Needs: Not on file  Physical Activity: Not on file  Stress: Not on file   Social Connections: Not on file   Past Surgical History:  Procedure Laterality Date  . ANKLE SURGERY Right 2011  . KNEE SURGERY Left 2020  . SPINE SURGERY  2013   Lumbar fusion L4-S1  . WISDOM TOOTH EXTRACTION     Past Medical History:  Diagnosis Date  . Anxiety   . Arthritis   . Depression   . Hypertension   . Myocardial infarction ([place])   . PTSD (post-traumatic stress disorder)   . Ulcerative colitis ([place]) 2019   BP (!) 144/83   Pulse 77   Temp 98.6 F (37 C)   Ht 6' (1.829 m)   Wt 208 lb 3.2 oz (94.4 kg)   SpO2 98%   BMI 28.24 kg/m   Opioid Risk Score:   Fall Risk Score:  `1  Depression screen PHQ 2/9  Depression screen [facility] 2/9 11/05/2020 10/08/2020 05/30/2020  Decreased Interest 3 3 2   Down, Depressed, Hopeless 3 3 3   PHQ - 2 Score 6 6 5   Altered sleeping 3 1 3   Tired, decreased energy 3 3 2   Change in appetite 3 3 3   Feeling bad or failure about yourself  3 3 2   Trouble concentrating 3 3 2   Moving slowly or fidgety/restless 2 2 2   Suicidal thoughts 1 1 1   PHQ-9 Score 24 22 20   Difficult doing work/chores Very difficult Very difficult Somewhat difficult   Review of Systems  Constitutional:       Night sweats  Gastrointestinal:       Incontinence  Genitourinary:       Incontinence  Musculoskeletal: Positive for arthralgias, back pain, gait problem, myalgias and neck pain.       Wrist pain Knee pain Ankle pain  Neurological: Positive for dizziness, tremors, weakness, numbness and headaches.  Psychiatric/Behavioral: Positive for sleep disturbance.  All other systems reviewed and are negative.     Objective:   Physical Exam  Constitutional: No distress . Vital signs reviewed. HENT: Normocephalic.  Atraumatic. Eyes: EOMI. No discharge. Cardiovascular: No JVD.   Respiratory: Normal effort.  No stridor.   GI: Non-distended.   Skin: Warm and dry.  Intact. Psych: Normal mood.  Normal behavior. Musc:  +TTP with minimal palpation along lumbosacral  PSPs Atrophy of core muscles Gait: Able to toe/heel walk Pes planus b/l Hypermobile joints Neuro: Alert Motor: B/l LE: HF 4-/5, KE 4--4/5, ADF 4+/5 (some pain inhibition, right stronger than left)    Assessment & Plan:  Patient with pmh/psh of UC, PTSD, MI, HTN, OA, anxiety/depression, right ankle surgery, L4-S1 lumbar fusion presents with low back pain. Back pain started in 2012 after he was moving an heavy object fracturing bones.   1. Chronic mechanical low back pain - multifactorial: Failed back syndrome, spondylosis,  facet arthropathy  MRI L-spine showing 1. Prior ALIF at L4-5 with residual mild narrowing of the lateral recesses without neural impingement. 2. Mild disc bulge with small central disc protrusion at L3-4 with resultant mild lateral recess stenosis without neural impingement. 3. Mild bilateral L3 and L4 foraminal narrowing related to mild degenerative spondylosis and facet hypertrophy.  Labs reviewed  Will try to avoid NSAID due to UC  Temporary benefit with Lidocaine patch, TENS unit  No benefit with heat/cold  Will order PT focusing on core strengthening and hypermobility   Will increase Gabapentin 122m TID  Will consider Cymbalta 377mdaily with food, pt currently on Trintellix  Will consider Robaxin   Will consider referral to Psychology  Will consider accupuncture  Will consider PNS  Patient states main goal is to be more active  Will order arch support for b/l Pes planus  2. Gait abnormality  Continue cane for safety  3. Sleep disturbance - due to pain +PTSD + depression  See #1  Will order Elavil 10qhs, educated on signs/symptoms of serotonin syndrome   4. Myalgia   Will consider trigger point injections

## 2020-12-10 ENCOUNTER — Other Ambulatory Visit: Payer: Self-pay | Admitting: Physician Assistant

## 2020-12-10 DIAGNOSIS — K769 Liver disease, unspecified: Secondary | ICD-10-CM

## 2020-12-13 ENCOUNTER — Ambulatory Visit (INDEPENDENT_AMBULATORY_CARE_PROVIDER_SITE_OTHER): Payer: BC Managed Care – PPO | Admitting: Psychologist

## 2020-12-13 DIAGNOSIS — F331 Major depressive disorder, recurrent, moderate: Secondary | ICD-10-CM | POA: Diagnosis not present

## 2020-12-13 DIAGNOSIS — Z634 Disappearance and death of family member: Secondary | ICD-10-CM

## 2020-12-13 DIAGNOSIS — F1010 Alcohol abuse, uncomplicated: Secondary | ICD-10-CM

## 2020-12-13 DIAGNOSIS — F4310 Post-traumatic stress disorder, unspecified: Secondary | ICD-10-CM | POA: Diagnosis not present

## 2020-12-27 ENCOUNTER — Ambulatory Visit
Admission: RE | Admit: 2020-12-27 | Discharge: 2020-12-27 | Disposition: A | Discharge status: Home / Self Care | Payer: BC Managed Care – PPO | Source: Ambulatory Visit | Attending: Physician Assistant | Admitting: Physician Assistant

## 2020-12-27 ENCOUNTER — Other Ambulatory Visit: Payer: Self-pay

## 2020-12-27 DIAGNOSIS — R634 Abnormal weight loss: Secondary | ICD-10-CM | POA: Diagnosis not present

## 2020-12-27 DIAGNOSIS — D1803 Hemangioma of intra-abdominal structures: Secondary | ICD-10-CM | POA: Diagnosis not present

## 2020-12-27 DIAGNOSIS — R112 Nausea with vomiting, unspecified: Secondary | ICD-10-CM | POA: Diagnosis not present

## 2020-12-27 DIAGNOSIS — K769 Liver disease, unspecified: Secondary | ICD-10-CM

## 2020-12-27 DIAGNOSIS — K7689 Other specified diseases of liver: Secondary | ICD-10-CM | POA: Diagnosis not present

## 2020-12-27 MED ORDER — GADOBENATE DIMEGLUMINE 529 MG/ML IV SOLN
20.0000 mL | Freq: Once | INTRAVENOUS | Status: AC | PRN
  Administered 2020-12-27: 20 mL via INTRAVENOUS

## 2020-12-31 ENCOUNTER — Ambulatory Visit (INDEPENDENT_AMBULATORY_CARE_PROVIDER_SITE_OTHER): Payer: BC Managed Care – PPO | Admitting: Psychologist

## 2020-12-31 DIAGNOSIS — F4310 Post-traumatic stress disorder, unspecified: Secondary | ICD-10-CM | POA: Diagnosis not present

## 2020-12-31 DIAGNOSIS — F331 Major depressive disorder, recurrent, moderate: Secondary | ICD-10-CM | POA: Diagnosis not present

## 2020-12-31 DIAGNOSIS — F1010 Alcohol abuse, uncomplicated: Secondary | ICD-10-CM | POA: Diagnosis not present

## 2020-12-31 DIAGNOSIS — Z634 Disappearance and death of family member: Secondary | ICD-10-CM

## 2021-01-03 ENCOUNTER — Encounter: Payer: Self-pay | Admitting: Physical Medicine & Rehabilitation

## 2021-01-03 ENCOUNTER — Encounter
Payer: BC Managed Care – PPO | Attending: Physical Medicine & Rehabilitation | Admitting: Physical Medicine & Rehabilitation

## 2021-01-03 ENCOUNTER — Encounter: Payer: BC Managed Care – PPO | Admitting: Physical Medicine & Rehabilitation

## 2021-01-03 ENCOUNTER — Other Ambulatory Visit: Payer: Self-pay

## 2021-01-03 DIAGNOSIS — M5441 Lumbago with sciatica, right side: Secondary | ICD-10-CM | POA: Diagnosis not present

## 2021-01-03 DIAGNOSIS — M47816 Spondylosis without myelopathy or radiculopathy, lumbar region: Secondary | ICD-10-CM | POA: Insufficient documentation

## 2021-01-03 DIAGNOSIS — G479 Sleep disorder, unspecified: Secondary | ICD-10-CM | POA: Diagnosis not present

## 2021-01-03 DIAGNOSIS — M961 Postlaminectomy syndrome, not elsewhere classified: Secondary | ICD-10-CM | POA: Insufficient documentation

## 2021-01-03 DIAGNOSIS — M2141 Flat foot [pes planus] (acquired), right foot: Secondary | ICD-10-CM | POA: Insufficient documentation

## 2021-01-03 DIAGNOSIS — R269 Unspecified abnormalities of gait and mobility: Secondary | ICD-10-CM | POA: Diagnosis not present

## 2021-01-03 DIAGNOSIS — M2142 Flat foot [pes planus] (acquired), left foot: Secondary | ICD-10-CM | POA: Insufficient documentation

## 2021-01-03 DIAGNOSIS — G8929 Other chronic pain: Secondary | ICD-10-CM | POA: Insufficient documentation

## 2021-01-03 MED ORDER — AMITRIPTYLINE HCL 25 MG PO TABS: 25.0000 mg | ORAL_TABLET | Freq: Every day | ORAL | 1 refills | Status: DC

## 2021-01-03 NOTE — Progress Notes (Signed)
Subjective:    Patient ID: Daniel Marsh, male    DOB: 22-Jul-1981, 40 y.o.   MRN: [id number]   TELEHEALTH NOTE  Due to national recommendations of social distancing due to COVID 19, an audio/video telehealth visit is felt to be most appropriate for this patient at this time. See Chart message from today for the patient's consent to telehealth from [place].   I verified that I am speaking with the correct person using two identifiers.  Location of patient: Home Location of provider: Office Method of communication: MyChart video Names of participants : Zorita Pang scheduling, [employer] obtaining consent and vitals if available Established patient (Note copied due to [facility] shut down)  HPI Patient with pmh/psh of UC, PTSD, MI, HTN, OA, anxiety/depression, right ankle surgery, L4-S1 lumbar fusion presents with low back pain.  Initially stated:  Back pain started in 2012 after he was moving an heavy object fracturing bones. He had fusion afterward. Since that time pain has been getting worse. TENS unit improves the pain. Located central lower back. Denies alleviating factors. Cannot identify exacerbating factors.  All qualities of pain - pin/needles worst.  Radiates to right toes an left knee.  Associated LLE numbness.  Has associated bowel/bladder incontinence. Denies falls. He moved to [place] from [facility]. He saw Sports Med and had steroid injection in his left knee and told her has left ACL and right meniscal tear.  He would like to be more active. Last fall 03/2020 with legs "giving out".  Pain limits activity.    HPI Patient with pmh/psh of UC, PTSD, MI, HTN, OA, anxiety/depression, right ankle surgery, L4-S1 lumbar fusion presents with low back pain.  Initially stated:  Back pain started in 2012 after he was moving an heavy object fracturing bones. He had fusion afterward. Since that time pain has been getting worse. TENS unit  improves the pain. Located central lower back. Denies alleviating factors. Cannot identify exacerbating factors.  All qualities of pain - pin/needles worst.  Radiates to right toes an left knee.  Associated LLE numbness.  Has associated bowel/bladder incontinence. Denies falls. He moved to [place] from [facility]. He saw Sports Med and had steroid injection in his left knee and told her has left ACL and right meniscal tear.  He would like to be more active. Last fall 03/2020 with legs "giving out".  Pain limits activity.   Last clinic visit on 12/06/2020.  Since that time, pt states he did not hear from PT.  He saw orthotics yesterday, states he will receive his orthosis in 4 weeks. He notes improvement with Gabapentin.  He notes improvement in leg pain. He states he a couple of falls in the snow going down a hill in his back yard.  He notes improvement in sleep with Elavil.    Pain Inventory Average Pain 8 Pain Right Now 8 My pain is intermittent, constant, burning, dull, stabbing, tingling and aching  In the last 24 hours, has pain interfered with the following? General activity 8 Relation with others 8 Enjoyment of life 8 What TIME of day is your pain at its worst? morning , daytime, evening and night Sleep (in general) Poor  Pain is worse with: walking, bending, sitting and standing Pain improves with: rest, heat/ice, therapy/exercise, medication, TENS and injections Relief from Meds: 2     Family History  Problem Relation Age of Onset  . Osteoarthritis Mother   . Depression Mother   .  Heart attack Mother   . Osteoarthritis Father   . Depression Father   . Depression Brother   . Diabetes Maternal Grandmother   . Hyperlipidemia Maternal Grandmother   . Hypertension Maternal Grandmother   . Heart attack Maternal Grandmother   . Osteoarthritis Maternal Grandfather   . COPD Maternal Grandfather   . Kidney disease Maternal Grandfather   . Renal cancer Maternal Grandfather   .  Alcohol abuse Paternal Grandmother   . COPD Paternal Grandmother   . Diabetes Paternal Grandmother   . Hypertension Paternal Grandmother   . Lung cancer Paternal Grandmother   . Osteoarthritis Paternal Grandfather   . Heart attack Paternal Grandfather   . Heart failure Paternal Grandfather   . Depression Brother   . Pancreatic cancer Maternal Aunt   . Lung cancer Paternal Aunt   . Colon cancer Maternal Uncle    Social History   Socioeconomic History  . Marital status: Married    Spouse name: Not on file  . Number of children: 3  . Years of education: Not on file  . Highest education level: Not on file  Occupational History  . Not on file  Tobacco Use  . Smoking status: Current Some Day Smoker    Types: Cigarettes, Cigars  . Smokeless tobacco: Current User    Types: Chew  Vaping Use  . Vaping Use: Never used  Substance and Sexual Activity  . Alcohol use: Yes    Alcohol/week: 13.0 standard drinks    Types: 10 Cans of beer, 3 Shots of liquor per week  . Drug use: Yes    Types: Marijuana    Comment: occ  . Sexual activity: Yes  Other Topics Concern  . Not on file  Social History Narrative   Was in the [employer] for 7 years, required lumbar surgery while in the [employer] and had poor outcome   Divorced and re-married   60 yo son   Social Determinants of Radio broadcast assistant Strain: Not on file  Food Insecurity: Not on file  Transportation Needs: Not on file  Physical Activity: Not on file  Stress: Not on file  Social Connections: Not on file   Past Surgical History:  Procedure Laterality Date  . ANKLE SURGERY Right 2011  . KNEE SURGERY Left 2020  . SPINE SURGERY  2013   Lumbar fusion L4-S1  . WISDOM TOOTH EXTRACTION     Past Medical History:  Diagnosis Date  . Anxiety   . Arthritis   . Depression   . Hypertension   . Myocardial infarction ([place])   . PTSD (post-traumatic stress disorder)   . Ulcerative colitis ([place]) 2019   There were no vitals  taken for this visit.  Opioid Risk Score:   Fall Risk Score:  `1  Depression screen PHQ 2/9  Depression screen [facility] 2/9 11/05/2020 10/08/2020 05/30/2020  Decreased Interest 3 3 2   Down, Depressed, Hopeless 3 3 3   PHQ - 2 Score 6 6 5   Altered sleeping 3 1 3   Tired, decreased energy 3 3 2   Change in appetite 3 3 3   Feeling bad or failure about yourself  3 3 2   Trouble concentrating 3 3 2   Moving slowly or fidgety/restless 2 2 2   Suicidal thoughts 1 1 1   PHQ-9 Score 24 22 20   Difficult doing work/chores Very difficult Very difficult Somewhat difficult   Review of Systems  Constitutional:       Night sweats  Gastrointestinal:  Incontinence  Genitourinary:       Incontinence  Musculoskeletal: Positive for arthralgias, back pain, gait problem, myalgias and neck pain.       Wrist pain Knee pain Ankle pain  Neurological: Positive for dizziness, tremors, weakness, numbness and headaches.  Psychiatric/Behavioral: Positive for sleep disturbance.  All other systems reviewed and are negative.     Objective:   Physical Exam  Constitutional: No distress . Vital signs reviewed. HENT: Normocephalic.  Atraumatic. Eyes: EOMI. No discharge. Respiratory: Normal effort.  No stridor.   Psych: Normal mood.  Normal behavior. Neuro: Alert    Assessment & Plan:  Patient with pmh/psh of UC, PTSD, MI, HTN, OA, anxiety/depression, right ankle surgery, L4-S1 lumbar fusion presents with low back pain. Back pain started in 2012 after he was moving an heavy object fracturing bones.   1. Chronic mechanical low back pain - multifactorial: Failed back syndrome, spondylosis, facet arthropathy  MRI L-spine showing 1. Prior ALIF at L4-5 with residual mild narrowing of the lateral recesses without neural impingement. 2. Mild disc bulge with small central disc protrusion at L3-4 with resultant mild lateral recess stenosis without neural impingement. 3. Mild bilateral L3 and L4 foraminal narrowing  related to mild degenerative spondylosis and facet hypertrophy.  Will try to avoid NSAID due to UC  Temporary benefit with Lidocaine patch, TENS unit  No benefit with heat/cold  PT focusing on core strengthening and hypermobility-recommended calling for appointment  Continue Gabapentin 1290m TID  Will consider Cymbalta 349mdaily with food, pt currently on Trintellix  Will consider Robaxin   Will consider referral to Psychology  Will consider accupuncture  Will consider PNS  Patient states main goal is to be more active  Await arch support for b/l Pes planus  2. Gait abnormality  Continue cane for safety  3. Sleep disturbance - due to pain +PTSD + depression  See #1  Will increase Elavil 25qhs-patient would like to go up on dose, educated on signs/symptoms of serotonin syndrome   4. Myalgia   Will consider trigger point injections

## 2021-01-07 ENCOUNTER — Encounter: Payer: Self-pay | Admitting: Physician Assistant

## 2021-01-07 ENCOUNTER — Other Ambulatory Visit: Payer: Self-pay | Admitting: Physician Assistant

## 2021-01-14 ENCOUNTER — Ambulatory Visit (INDEPENDENT_AMBULATORY_CARE_PROVIDER_SITE_OTHER): Payer: BC Managed Care – PPO | Admitting: Psychologist

## 2021-01-14 DIAGNOSIS — F331 Major depressive disorder, recurrent, moderate: Secondary | ICD-10-CM | POA: Diagnosis not present

## 2021-01-14 DIAGNOSIS — F4310 Post-traumatic stress disorder, unspecified: Secondary | ICD-10-CM | POA: Diagnosis not present

## 2021-01-14 DIAGNOSIS — Z634 Disappearance and death of family member: Secondary | ICD-10-CM

## 2021-01-14 DIAGNOSIS — F1010 Alcohol abuse, uncomplicated: Secondary | ICD-10-CM

## 2021-01-17 ENCOUNTER — Encounter: Payer: Self-pay | Admitting: Physician Assistant

## 2021-01-17 ENCOUNTER — Ambulatory Visit: Payer: BC Managed Care – PPO | Admitting: Physician Assistant

## 2021-01-17 VITALS — BP 124/78 | HR 76 | Ht 72.0 in | Wt 210.4 lb

## 2021-01-17 DIAGNOSIS — K51911 Ulcerative colitis, unspecified with rectal bleeding: Secondary | ICD-10-CM

## 2021-01-17 DIAGNOSIS — K625 Hemorrhage of anus and rectum: Secondary | ICD-10-CM

## 2021-01-17 DIAGNOSIS — K642 Third degree hemorrhoids: Secondary | ICD-10-CM | POA: Insufficient documentation

## 2021-01-17 DIAGNOSIS — D1803 Hemangioma of intra-abdominal structures: Secondary | ICD-10-CM | POA: Diagnosis not present

## 2021-01-17 MED ORDER — DICYCLOMINE HCL 10 MG PO CAPS: 10.0000 mg | ORAL_CAPSULE | Freq: Three times a day (TID) | ORAL | 6 refills | Status: DC | PRN

## 2021-01-17 MED ORDER — HYDROCORTISONE ACETATE 25 MG RE SUPP: RECTAL | 2 refills | Status: DC

## 2021-01-17 MED ORDER — MESALAMINE ER 0.375 G PO CP24
375.0000 mg | ORAL_CAPSULE | Freq: Four times a day (QID) | ORAL | 3 refills | Status: DC
Start: 2021-01-17 — End: 2021-08-02

## 2021-01-17 NOTE — Progress Notes (Signed)
Subjective:    Patient ID: Daniel Marsh, male    DOB: 1981/08/27, 40 y.o.   MRN: [id number]  HPI CJ is a pleasant 40 year old African-American male, now established with Dr. Bryan Lemma, and seen by myself initially on 07/09/2020.  Patient comes in today for follow-up of ulcerative colitis and with complaints of continued rectal bleeding. Patient has prior diagnosis of ulcerative colitis made in 2019 elsewhere.  He had been managed with Apriso which had been effective but he had been out of medication for a few months prior to that office visit.  He had also had prior endoscopies and colonoscopies while he was in the [employer] and had been told that he had an ulcer at one point. Patient has history of PTSD, chronic anxiety, hypertension, he is status post lumbar fusion. He was complaining of increase in colitis symptoms at the time of that visit with looser stools and rectal bleeding.  He was resumed on Apriso 4 tablets daily and also given a steroid taper.  He was continued on omeprazole 40 mg p.o. daily. He underwent colonoscopy on 07/17/2020 and was found to have a 4 mm polyp in the sigmoid colon which was hyperplastic there was some mild scattered erythema in the sigmoid colon but no ulcerations.  Biopsies from around the colon all showed no active colitis including those from the sigmoid.  He was documented to have grade 3 internal hemorrhoids and noted to have external hemorrhoids as well. Patient says for the most part he is having normal bowel movements occasional pasty stools.  Is not having any ongoing abdominal pain, but complains of intermittent sharp rectal pain.  He has tenderness and discomfort from the hemorrhoids which she says protrude especially with wiping.  He is noticing blood with most bowel movements.  Occasionally he sits down to have a bowel movement and will just pass some blood and also will have some oozing into his underclothes 2 or 3 times per week.  He would like  something definitive done about the hemorrhoids. He also mentions that he was recently diagnosed with a liver issue.  Reviewing his chart he did have CT of the abdomen and pelvis done by primary care on 11/27/2020 for weight loss and was found to have some indeterminate hypodensities in the liver majority too small to characterize the largest is 1.3 cm and MRI was recommended.  No other intra-abdominal or intrapelvic process noted. Subsequent MRI of the abdomen 12/27/2020 showed a 12 mm well-circumscribed lesion in the right hepatic lobe with slow peripheral enhancement consistent with a benign hemangioma also noted to have several subcentimeter nonenhancing cysts in the left and right lobe. He has had multiple labs done regarding the weight loss and other complaints of night sweats etc. unrevealing to date.  Patient says he never feels well, is generally fatigued, intermittently lightheaded. Weight loss has stabilized and it actually gained 4 pounds over the past month. HIV was negative hepatitis C negative.  LFTs normal, sed rate normal, TSH normal ANA negative. Patient relates that he has been scheduled to see [facility]ology.  Review of Systems Pertinent positive and negative review of systems were noted in the above HPI section.  All other review of systems was otherwise negative.  Outpatient Encounter Medications as of 01/17/2021  Medication Sig  . amitriptyline (ELAVIL) 25 MG tablet Take 1 tablet (25 mg total) by mouth at bedtime.  . diazepam (VALIUM) 5 MG tablet Take 1 tablet (5 mg total) by mouth at bedtime.  [name]  gabapentin (NEURONTIN) 600 MG tablet Take 2 tablets (1,200 mg total) by mouth 3 (three) times daily.  [name] omeprazole (PRILOSEC) 40 MG capsule Take 1 capsule (40 mg total) by mouth daily.  [name] vortioxetine HBr (TRINTELLIX) 10 MG TABS tablet Take 1 tablet (10 mg total) by mouth daily.  . [DISCONTINUED] dicyclomine (BENTYL) 10 MG capsule Take 1 capsule (10 mg total) by mouth 3 (three) times  daily as needed (abdominal pain/ cramping).  [name] dicyclomine (BENTYL) 10 MG capsule Take 1 capsule (10 mg total) by mouth 3 (three) times daily as needed (abdominal pain/ cramping).  . mesalamine (APRISO) 0.375 g 24 hr capsule Take 1 capsule (0.375 g total) by mouth in the morning, at noon, in the evening, and at bedtime.  . prazosin (MINIPRESS) 1 MG capsule Take 2 capsules (2 mg total) by mouth at bedtime.  . [DISCONTINUED] mesalamine (APRISO) 0.375 g 24 hr capsule Take 1 capsule (0.375 g total) by mouth in the morning, at noon, in the evening, and at bedtime.   No facility-administered encounter medications on file as of 01/17/2021.   No Known Allergies Patient Active Problem List   Diagnosis Date Noted  . Hepatic hemangioma 01/17/2021  . Grade III internal hemorrhoids 01/17/2021  . Failed back syndrome 01/03/2021  . Facet arthropathy, lumbar 01/03/2021  . Sleep disturbance 12/06/2020  . Abnormality of gait 12/06/2020  . 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/05/2020  . Hyperlipidemia 06/01/2020  . PTSD (post-traumatic stress disorder)   . Hypertension   . Depression   . Anxiety   . Ulcerative colitis ([place]) 2019  . Myocardial infarction [facility]) 2015   Social History   Socioeconomic History  . Marital status: Married    Spouse name: Not on file  . Number of children: 3  . Years of education: Not on file  . Highest education level: Not on file  Occupational History  . Not on file  Tobacco Use  . Smoking status: Current Some Day Smoker    Types: Cigarettes, Cigars  . Smokeless tobacco: Current User    Types: Chew  Vaping Use  . Vaping Use: Never used  Substance and Sexual Activity  . Alcohol use: Yes    Alcohol/week: 13.0 standard drinks    Types: 10 Cans of beer, 3 Shots of liquor per week  . Drug use: Yes    Types: Marijuana    Comment: occ  . Sexual activity: Yes  Other Topics Concern  . Not on file  Social History Narrative   Was in the [employer]  for 7 years, required lumbar surgery while in the [employer] and had poor outcome   Divorced and re-married   26 yo son   Social Determinants of Radio broadcast assistant Strain: Not on file  Food Insecurity: Not on file  Transportation Needs: Not on file  Physical Activity: Not on file  Stress: Not on file  Social Connections: Not on file  Intimate Partner Violence: Not on file    Mr. Doswell family history includes Alcohol abuse in his paternal grandmother; COPD in his maternal grandfather and paternal grandmother; Colon cancer in his maternal uncle; Depression in his brother, brother, father, and mother; Diabetes in his maternal grandmother and paternal grandmother; Heart attack in his maternal grandmother, mother, and paternal grandfather; Heart failure in his paternal grandfather; Hyperlipidemia in his maternal grandmother; Hypertension in his maternal grandmother and paternal grandmother; Kidney disease in his maternal grandfather; Lung cancer in his paternal aunt and  paternal grandmother; Osteoarthritis in his father, maternal grandfather, mother, and paternal grandfather; Pancreatic cancer in his maternal aunt; Renal cancer in his maternal grandfather.      Objective:    Vitals:   01/17/21 1411  BP: 124/78  Pulse: 76  SpO2: 98%    Physical Exam Well-developed well-nourished AA male  in no acute distress.  Accompanied by wife and 2 small children Weight, 210 BMI 28.5  HEENT; nontraumatic normocephalic, EOMI, PE R LA, sclera anicteric. Oropharynx; not examined today Neck; supple, no JVD Cardiovascular; regular rate and rhythm with S1-S2, no murmur rub or gallop Pulmonary; Clear bilaterally Abdomen; soft, nontender, nondistended, no palpable mass or hepatosplenomegaly, bowel sounds are active, midline incisional scar Rectal; rectal not done today Skin; benign exam, no jaundice rash or appreciable lesions Extremities; no clubbing cyanosis or edema skin warm and  dry Neuro/Psych; alert and oriented x4, grossly nonfocal mood and affect appropriate       Assessment & Plan:   #16 40 year old African-American male with diagnosis of ulcerative colitis left-sided, with essentially no evidence of active disease on recent colonoscopy August 2021.  Currently stable on Apriso 0.375/4 daily.  #2 symptomatic grade 3 internal hemorrhoids with frequent bleeding #3 small hepatic hemangioma on recent MRI, 12 mm, incidental benign hepatic cysts subcentimeter Reviewing labs and imaging I do not see any evidence of underlying liver disease.  #4 PTSD # 5.  Chronic anxiety  Plan; continue Apriso 4 tablets daily, refill sent x1 year Start Anusol HC suppositories nightly x5 to 7 days then as needed Refill Bentyl 10 mg p.o. twice daily to 3 times daily as needed. Patient will be scheduled for hemorrhoidal banding procedure/appointment with Dr. Bryan Lemma.  Procedure was discussed in detail with the patient today and he wishes to proceed.   Jestina Stephani S Janmarie Smoot PA-C 01/17/2021   Cc: Inda Coke, [place]

## 2021-01-17 NOTE — Patient Instructions (Addendum)
If you are age 38 or older, your body mass index should be between 23-30. Your Body mass index is 28.54 kg/m. If this is out of the aforementioned range listed, please consider follow up with your Primary Care Provider.  If you are age 72 or younger, your body mass index should be between 19-25. Your Body mass index is 28.54 kg/m. If this is out of the aformentioned range listed, please consider follow up with your Primary Care Provider.   We have sent refills of your Apriso and Bentyl to your pharmacy.  You have been scheduled to see Dr. Bryan Lemma at our [facility] on February 07, 2021 at 3:40 pm for a hemorrhoid banding appointment.  Follow up will be decided by Dr. Bryan Lemma.  Thank you for entrusting me with your care and choosing [facility].  Amy Esterwood, PA-C

## 2021-01-23 ENCOUNTER — Ambulatory Visit (INDEPENDENT_AMBULATORY_CARE_PROVIDER_SITE_OTHER): Payer: BC Managed Care – PPO | Admitting: Psychologist

## 2021-01-23 DIAGNOSIS — F4310 Post-traumatic stress disorder, unspecified: Secondary | ICD-10-CM | POA: Diagnosis not present

## 2021-01-23 DIAGNOSIS — F331 Major depressive disorder, recurrent, moderate: Secondary | ICD-10-CM | POA: Diagnosis not present

## 2021-01-23 DIAGNOSIS — Z634 Disappearance and death of family member: Secondary | ICD-10-CM | POA: Diagnosis not present

## 2021-01-23 DIAGNOSIS — F1010 Alcohol abuse, uncomplicated: Secondary | ICD-10-CM | POA: Diagnosis not present

## 2021-01-23 NOTE — Progress Notes (Signed)
Agree with the assessment and plan as outlined by Nicoletta Ba, PA-C.  Plan for hemorrhoid banding in the office as outlined.  UC otherwise well-controlled on current therapy.  Vito Cirigliano, DO, [facility]

## 2021-02-01 DIAGNOSIS — Z114 Encounter for screening for human immunodeficiency virus [HIV]: Secondary | ICD-10-CM | POA: Diagnosis not present

## 2021-02-01 DIAGNOSIS — Z1159 Encounter for screening for other viral diseases: Secondary | ICD-10-CM | POA: Diagnosis not present

## 2021-02-01 DIAGNOSIS — D1803 Hemangioma of intra-abdominal structures: Secondary | ICD-10-CM | POA: Diagnosis not present

## 2021-02-01 DIAGNOSIS — Z139 Encounter for screening, unspecified: Secondary | ICD-10-CM | POA: Diagnosis not present

## 2021-02-04 ENCOUNTER — Ambulatory Visit (INDEPENDENT_AMBULATORY_CARE_PROVIDER_SITE_OTHER): Payer: BC Managed Care – PPO | Admitting: Psychologist

## 2021-02-04 ENCOUNTER — Encounter
Payer: BC Managed Care – PPO | Attending: Physical Medicine & Rehabilitation | Admitting: Physical Medicine & Rehabilitation

## 2021-02-04 DIAGNOSIS — G479 Sleep disorder, unspecified: Secondary | ICD-10-CM | POA: Insufficient documentation

## 2021-02-04 DIAGNOSIS — M47816 Spondylosis without myelopathy or radiculopathy, lumbar region: Secondary | ICD-10-CM | POA: Insufficient documentation

## 2021-02-04 DIAGNOSIS — M5441 Lumbago with sciatica, right side: Secondary | ICD-10-CM | POA: Insufficient documentation

## 2021-02-04 DIAGNOSIS — M2142 Flat foot [pes planus] (acquired), left foot: Secondary | ICD-10-CM | POA: Insufficient documentation

## 2021-02-04 DIAGNOSIS — Z634 Disappearance and death of family member: Secondary | ICD-10-CM

## 2021-02-04 DIAGNOSIS — F4310 Post-traumatic stress disorder, unspecified: Secondary | ICD-10-CM

## 2021-02-04 DIAGNOSIS — F1010 Alcohol abuse, uncomplicated: Secondary | ICD-10-CM

## 2021-02-04 DIAGNOSIS — G8929 Other chronic pain: Secondary | ICD-10-CM | POA: Insufficient documentation

## 2021-02-04 DIAGNOSIS — M2141 Flat foot [pes planus] (acquired), right foot: Secondary | ICD-10-CM | POA: Insufficient documentation

## 2021-02-04 DIAGNOSIS — R269 Unspecified abnormalities of gait and mobility: Secondary | ICD-10-CM | POA: Insufficient documentation

## 2021-02-04 DIAGNOSIS — F331 Major depressive disorder, recurrent, moderate: Secondary | ICD-10-CM

## 2021-02-04 DIAGNOSIS — M961 Postlaminectomy syndrome, not elsewhere classified: Secondary | ICD-10-CM | POA: Insufficient documentation

## 2021-02-07 ENCOUNTER — Ambulatory Visit: Payer: BC Managed Care – PPO | Admitting: Gastroenterology

## 2021-02-07 ENCOUNTER — Encounter: Payer: Self-pay | Admitting: Gastroenterology

## 2021-02-07 VITALS — BP 132/82 | HR 88 | Ht 72.0 in | Wt 220.1 lb

## 2021-02-07 DIAGNOSIS — K642 Third degree hemorrhoids: Secondary | ICD-10-CM | POA: Diagnosis not present

## 2021-02-07 DIAGNOSIS — K921 Melena: Secondary | ICD-10-CM

## 2021-02-07 NOTE — Patient Instructions (Addendum)
If you are age 38 or older, your body mass index should be between 23-30. Your Body mass index is 29.85 kg/m. If this is out of the aforementioned range listed, please consider follow up with your Primary Care Provider.  If you are age 51 or younger, your body mass index should be between 19-25. Your Body mass index is 29.85 kg/m. If this is out of the aformentioned range listed, please consider follow up with your Primary Care Provider.   HEMORRHOID BANDING PROCEDURE    FOLLOW-UP CARE   1. The procedure you have had should have been relatively painless since the banding of the area involved does not have nerve endings and there is no pain sensation.  The rubber band cuts off the blood supply to the hemorrhoid and the band may fall off as soon as 48 hours after the banding (the band may occasionally be seen in the toilet bowl following a bowel movement). You may notice a temporary feeling of fullness in the rectum which should respond adequately to plain Tylenol or Motrin.  2. Following the banding, avoid strenuous exercise that evening and resume full activity the next day.  A sitz bath (soaking in a warm tub) or bidet is soothing, and can be useful for cleansing the area after bowel movements.     3. To avoid constipation, take two tablespoons of natural wheat bran, natural oat bran, flax, Benefiber or any over the counter fiber supplement and increase your water intake to 7-8 glasses daily.    4. Unless you have been prescribed anorectal medication, do not put anything inside your rectum for two weeks: No suppositories, enemas, fingers, etc.  5. Occasionally, you may have more bleeding than usual after the banding procedure.  This is often from the untreated hemorrhoids rather than the treated one.  Don't be concerned if there is a tablespoon or so of blood.  If there is more blood than this, lie flat with your bottom higher than your head and apply an ice pack to the area. If the bleeding  does not stop within a half an hour or if you feel faint, call our office at [phone] or go to the emergency room.  6. Problems are not common; however, if there is a substantial amount of bleeding, severe pain, chills, fever or difficulty passing urine (very rare) or other problems, you should call us at [phone] or report to the nearest emergency room.  7. Do not stay seated continuously for more than 2-3 hours for a day or two after the procedure.  Tighten your buttock muscles 10-15 times every two hours and take 10-15 deep breaths every 1-2 hours.  Do not spend more than a few minutes on the toilet if you cannot empty your bowel; instead re-visit the toilet at a later time.    Due to recent changes in healthcare laws, you may see the results of your imaging and laboratory studies on MyChart before your provider has had a chance to review them.  We understand that in some cases there may be results that are confusing or concerning to you. Not all laboratory results come back in the same time frame and the provider may be waiting for multiple results in order to interpret others.  Please give [place] 48 hours in order for your provider to thoroughly review all the results before contacting the office for clarification of your results.   Your 2nd banding procedure is scheduled 03/06/2021 @ 3:40pm  Due to recent changes in healthcare laws, you may see the results of your imaging and laboratory studies on MyChart before your provider has had a chance to review them.  We understand that in some cases there may be results that are confusing or concerning to you. Not all laboratory results come back in the same time frame and the provider may be waiting for multiple results in order to interpret others.  Please give [place] 48 hours in order for your provider to thoroughly review all the results before contacting the office for clarification of your results.   Thank you for choosing me and [facility].  Vito Cirigliano, D.O.

## 2021-02-07 NOTE — Progress Notes (Signed)
P  Chief Complaint:    Symptomatic Internal Hemorrhoids; Hemorrhoid Band Ligation  GI History: 40 year old male with history of PTSD, chronic anxiety, HTN, lumbar fusion, follows in the GI clinic for the following:  1) Hx of Ulcerative Colitis.  Diagnosed at OSH in 2019.  Previously managed with Apriso, with breakthrough symptoms when he ran out of medications in 2021.  Treated in 07/2020 with steroid taper and resuming Apriso 1.5 g/day.  Normal CBC, ESR, CRP. -Colonoscopy (07/2020, Dr. Bryan Lemma): 4 mm sigmoid hyperplastic polyp, mild scattered sigmoid erythema (path benign, no active/chronic inflammation).  Grade 3 internal hemorrhoids and external hemorrhoids  2) symptomatic hemorrhoids.  Characterized by hematochezia, rectal discomfort.  Grade 3 hemorrhoids noted on colonoscopy. -02/07/2021: Presents for hemorrhoid banding #1  3) Hhistory of GERD, well-controlled omeprazole 40 mg/day.  4) Hepatic angioma -CT abdomen/pelvis (10/2020): Indeterminate hypodensities in the liver, majority too small to characterize, measuring up to 1.3 cm.  Otherwise normal abdomen -MRI abdomen (12/2020): 12 mm well-circumscribed lesion in right hepatic lobe c/w benign hemangioma.  Several subcentimeter nonenhancing benign hepatic cysts  -Liver enzymes normal -02/01/2021: Was seen in the [facility].  Agreed with diagnosis of hepatic angioma with plan for repeat MRI in 6 months to ensure size stability along with checking viral hepatitis panels for appropriate immunity  HPI:     Patient is a 40 y.o. malewith a history of symptomatic internal hemorrhoids presenting to the Gastroenterology Clinic for follow-up and ongoing treatment.  Last seen in the office by Nicoletta Ba on 01/17/2021.  At that time, bowel habits have returned essentially to normal.  No abdominal pain, but does have intermittent sharp rectal pain along with ongoing hematochezia, presumed 2/2 continued symptomatic grade 3  hemorrhoids.  Hemorrhoids have otherwise been unresponsive to maximal medical therapy, and he presents today requesting rubber band ligation of symptomatic hemorrhoidal disease.  Otherwise, UC well-controlled with Apriso 1.5 g/day.  Uses Bentyl 10 mg prn abdominal cramping.  No change in medical or surgical history, medications, allergies, social history since last appointment in GI clinic.    CBC Latest Ref Rng & Units 11/21/2020 07/09/2020  WBC 3.8 - 10.8 Thousand/uL 5.1 6.4  Hemoglobin 13.2 - 17.1 g/dL 15.0 15.6  Hematocrit 38.5 - 50.0 % 45.9 46.7  Platelets 140 - 400 Thousand/uL 249 268.0   CMP Latest Ref Rng & Units 11/21/2020 06/01/2020  Glucose 65 - 99 mg/dL 84 -  BUN 7 - 25 mg/dL 8 -  Creatinine 0.60 - 1.35 mg/dL 0.97 -  Sodium 135 - 146 mmol/L 142 -  Potassium 3.5 - 5.3 mmol/L 4.5 -  Chloride 98 - 110 mmol/L 106 -  CO2 20 - 32 mmol/L 28 -  Calcium 8.6 - 10.3 mg/dL 9.6 -  Total Protein 6.1 - 8.1 g/dL 6.9 7.0  Total Bilirubin 0.2 - 1.2 mg/dL 0.5 0.8  Alkaline Phos 48 - 121 IU/L - 63  AST 10 - 40 U/L 15 18  ALT 9 - 46 U/L 10 11     Review of systems:     No chest pain, no SOB, no fevers, no urinary sx   Past Medical History:  Diagnosis Date  . Anxiety   . Arthritis   . Depression   . Hypertension   . Myocardial infarction ([place])   . PTSD (post-traumatic stress disorder)   . Ulcerative colitis ([place]) 2019    Patient's surgical history, family medical history, social history, medications and allergies were all reviewed in Epic  Current Outpatient Medications  Medication Sig Dispense Refill  . amitriptyline (ELAVIL) 25 MG tablet Take 1 tablet (25 mg total) by mouth at bedtime. 30 tablet 1  . diazepam (VALIUM) 5 MG tablet Take 1 tablet (5 mg total) by mouth at bedtime. 30 tablet 2  . dicyclomine (BENTYL) 10 MG capsule Take 1 capsule (10 mg total) by mouth 3 (three) times daily as needed (abdominal pain/ cramping). 90 capsule 6  . gabapentin (NEURONTIN) 600 MG tablet  Take 2 tablets (1,200 mg total) by mouth 3 (three) times daily. 180 tablet 1  . hydrocortisone (ANUSOL-HC) 25 MG suppository Use 1 suppository rectally at night time for 5-7 night, then use as needed for hemorrhoidal bleeding. (Patient taking differently: Place 25 mg rectally as needed. Use 1 suppository rectally at night time for 5-7 night, then use as needed for hemorrhoidal bleeding.) 12 suppository 2  . mesalamine (APRISO) 0.375 g 24 hr capsule Take 1 capsule (0.375 g total) by mouth in the morning, at noon, in the evening, and at bedtime. 120 capsule 3  . omeprazole (PRILOSEC) 40 MG capsule Take 1 capsule (40 mg total) by mouth daily. 90 capsule 3  . prazosin (MINIPRESS) 1 MG capsule Take 2 capsules (2 mg total) by mouth at bedtime. 180 capsule 1  . vortioxetine HBr (TRINTELLIX) 10 MG TABS tablet Take 1 tablet (10 mg total) by mouth daily. 90 tablet 1   No current facility-administered medications for this visit.    Physical Exam:     BP 132/82   Pulse 88   Ht 6' (1.829 m)   Wt 220 lb 2 oz (99.8 kg)   BMI 29.85 kg/m   GENERAL:  Pleasant male in NAD PSYCH: : Cooperative, normal affect NEURO: Alert and oriented x 3, no focal neurologic deficits Rectal exam: Sensation intact and preserved anal wink.   External skin tag.  Grade 2 hemorrhoids noted in all positions on anoscopy, with active inflammation in RP and LL hemorrhoids.  No external anal fissures noted. Normal sphincter tone. No palpable mass. No blood on the exam glove. (Chaperone: Curlene Labrum, CMA).   IMPRESSION and PLAN:    #1.  Symptomatic internal hemorrhoids: PROCEDURE NOTE: The patient presents with symptomatic grade 2-3 hemorrhoids, unresponsive to maximal medical therapy, requesting rubber band ligation of symptomatic hemorrhoidal disease.  All risks, benefits and alternative forms of therapy were described and informed consent was obtained.  In the Left Lateral Decubitus position, anoscopic examination revealed grade  2 hemorrhoids in the all position(s).  The anorectum was pre-medicated with RectiCare. The decision was made to band the RP internal hemorrhoid, and the [place] was used to perform band ligation without complication.  Digital anorectal examination was then performed to assure proper positioning of the band, and to adjust the banded tissue as required.  The patient was discharged home without pain or other issues.  Dietary and behavioral recommendations were given and along with follow-up instructions.     The following adjunctive treatments were recommended:  -Resume high-fiber diet with fiber supplement (i.e. Citrucel or Benefiber) with goal for soft stools without straining to have a BM. -Resume adequate fluid intake.  The patient will return in 4 for  follow-up and possible additional banding as required. No complications were encountered and the patient tolerated the procedure well.      #2.  Ulcerative Colitis -Well-controlled on current therapy -Continue Apriso 1.5 g/day -Up-to-date on surveillance  #3.  Hepatic hemangioma -Was seen in the [facility] with plan for repeat MRI in 6 months to ensure size stability.  He will follow-up there for that MRI     #4.  GERD -Well-controlled on omeprazole -Continue antireflux lifestyle/dietary modifications  I spent 20 additional minutes of nonprocedural time, including independent review of results as outlined above, review of outside notes, communicating results with the patient directly, face-to-face time with the patient, coordinating care, ordering studies and medications as appropriate, and documentation.     Lavena Bullion ,DO, FACG 02/07/2021, 3:54 PM

## 2021-02-19 ENCOUNTER — Ambulatory Visit: Payer: BC Managed Care – PPO | Admitting: Psychologist

## 2021-02-21 ENCOUNTER — Ambulatory Visit: Payer: BC Managed Care – PPO | Attending: Physical Medicine & Rehabilitation

## 2021-02-21 ENCOUNTER — Other Ambulatory Visit: Payer: Self-pay

## 2021-02-21 DIAGNOSIS — G8929 Other chronic pain: Secondary | ICD-10-CM | POA: Insufficient documentation

## 2021-02-21 DIAGNOSIS — R262 Difficulty in walking, not elsewhere classified: Secondary | ICD-10-CM | POA: Diagnosis not present

## 2021-02-21 DIAGNOSIS — M6281 Muscle weakness (generalized): Secondary | ICD-10-CM | POA: Insufficient documentation

## 2021-02-21 DIAGNOSIS — R2689 Other abnormalities of gait and mobility: Secondary | ICD-10-CM | POA: Insufficient documentation

## 2021-02-21 DIAGNOSIS — M5442 Lumbago with sciatica, left side: Secondary | ICD-10-CM | POA: Diagnosis not present

## 2021-02-21 DIAGNOSIS — M5441 Lumbago with sciatica, right side: Secondary | ICD-10-CM | POA: Insufficient documentation

## 2021-02-21 NOTE — Therapy (Signed)
[place], [place], [address] Phone: [phone]   Fax:  [phone]  Physical Therapy Evaluation  Patient Details  Name: Daniel Marsh MRN: [id number] Date of Birth: 23-Jan-1981 Referring Provider (PT): Jamse Arn, MD   Encounter Date: 02/21/2021   PT End of Session - 02/21/21 1459    Visit Number 1    Number of Visits 17    Date for PT Re-Evaluation 04/20/21    Authorization Type [employer] - FOTO visit 6 and visit 10    PT Start Time 1500    PT Stop Time 1530    PT Time Calculation (min) 30 min    Activity Tolerance Patient tolerated treatment well    Behavior During Therapy [facility] for tasks assessed/performed           Past Medical History:  Diagnosis Date  . Anxiety   . Arthritis   . Depression   . Hypertension   . Myocardial infarction ([place])   . PTSD (post-traumatic stress disorder)   . Ulcerative colitis ([place]) 2019    Past Surgical History:  Procedure Laterality Date  . ANKLE SURGERY Right 2011  . KNEE SURGERY Left 2020  . SPINE SURGERY  2013   Lumbar fusion L4-S1  . WISDOM TOOTH EXTRACTION      There were no vitals filed for this visit.    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 During that surgery, my sciatic nerve was nicked. My back hurts constantly and is hot. You can touch it and physically feel the inflammation. Dr. [name] and I have have moved up to 1200 mg milligrams of gabapentin, and I guess it's doing the best it can, but I have constant pain. There are days where I am almost incapacitated. If I've done too much one day, I can expect to need to rest over the next 2 days. My son is 48 years old, and he's my helper. I have knee issues in both knees. Sometimes the floor is the best place for me to lay. I was using the cane [facility]) after surgery then wasn't for a while. After falling a couple times, I started  using it again - maybe a month ago."    Limitations Sitting;Standing;Walking;House hold activities;Lifting    How long can you sit comfortably? "every 3-4 minutes"    How long can you stand comfortably? "some days are better than others, so it depends. Usually 10-15 minutes before I need to sit for 2-3 minutes then can get back up."    How long can you walk comfortably? "the distance we just walked is fine (lobby to back of therapy gym). I can't walk the wall"    Diagnostic tests 11/17/2020 lumbar spine MRI: 1. Prior ALIF at L4-5 with residual mild narrowing of the lateral recesses without neural impingement. 2. Mild disc bulge with small central disc protrusion at L3-4 with resultant mild lateral recess stenosis without neural impingement. 3. Mild bilateral L3 and L4 foraminal narrowing related to mild  degenerative spondylosis and facet hypertrophy.    Patient Stated Goals everyday life - "I just want to feel better physically to be able to do normal things, like being able to walk the mall and being able to get on the floor and play with my son."    Currently in Pain? No/denies    Pain Score 7    7/10 is  normal/typical for patient   Pain Location Back    Pain Orientation Right;Left;Mid;Lower    Pain Descriptors / Indicators Aching;Burning;Stabbing   constant pain is aching and burning; stabbing when intense   Pain Type Chronic pain    Pain Radiating Towards radicular pain in BLE - constant N/T in entire LLE and only tingling to R knee recently    Pain Onset More than a month ago    Pain Frequency Constant    Aggravating Factors  anything strenuous, prolonged position, bending    Pain Relieving Factors lying on the floor (describes performing pelvic tilt/TrA contraction), TENS (temporary minimal relief), heating pad (has burned his skin with heating pad directly on back for 1-1.5 hour duration), hot water    Effect of Pain on Daily Activities Difficulty performing all tasks due to constant pain               [facility] PT Assessment - 02/21/21 0001      Assessment   Medical Diagnosis Chronic midline low back pain with right-sided sciatica (M54.41, G89.29), 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 4   "usually I'll stand and my legs give. one day I was walking through the kitchen and my knees buckled"   Has the patient had a decrease in activity level because of a fear of falling?  Yes    Is the patient reluctant to leave their home because of a fear of falling?  Yes      [place] Private residence    Living Arrangements Spouse/significant other;Children   wife and 27 year old son   Available Help at Discharge Family    Type of [place] to enter    Entrance Stairs-Number of Steps 4    Entrance Stairs-Rails Left   L side ascending   Home Layout One level   1 small step inside   [employer] - single point   also has a Counselling psychologist     Prior Function   Level of Independence Independent    Vocation Unemployed   currently trying to get on disability   Leisure Hiking, fishing, and other outdoor activities - has hiking sticks and leg braces for stability      Cognition   Overall Cognitive Status Within Functional Limits for tasks assessed      Observation/Other Assessments   Focus on Therapeutic Outcomes (FOTO)  32% function; predicted 48% function      ROM / Strength   AROM / PROM / Strength AROM;Strength      AROM   Overall AROM Comments BLE AROM WFL as  assessed with patient sitting in chair but difficulty performing bilateral hip FL, demonstrating compensatory contralateral trunk lean      Strength   Overall Strength Comments Able to resisted bilateral hip/knee motions initially but unable to hold against resistance > 2-3 seconds.  Pain with resisted left ankle dorsiflexion and pain with resisted right knee extension and flexion    Strength Assessment Site Hip;Knee;Ankle    Right/Left Hip Right;Left    Right Hip Flexion 3+/5    Right Hip ABduction 3+/5    Right Hip ADduction 3+/5    Left Hip Flexion 3+/5    Left Hip ABduction 3+/5    Left Hip ADduction 3+/5    Right/Left Knee Right;Left    Right Knee Flexion 4-/5    Right Knee Extension 4/5    Left Knee Flexion 4-/5    Left Knee Extension 3+/5    Right/Left Ankle Right;Left    Right Ankle Dorsiflexion 4+/5    Right Ankle Plantar Flexion 5/5   modified test in sitting   Left Ankle Dorsiflexion 4-/5    Left Ankle Plantar Flexion 4+/5   modified test in sitting                     Objective measurements completed on examination: See above findings.       [facility] Adult PT Treatment/Exercise - 02/21/21 0001      Self-Care   Self-Care Other Self-Care Comments    Other Self-Care Comments  See patient education      Exercises   Exercises Lumbar                  PT Education - 02/21/21 1739    Education Details Initial HEP (demonstrated PPT and advised patient to look at video on [facility] app/site for reference), anatomy of condition, FOTO and potential progress with skilled PT, POC    Person(s) Educated Patient    Methods Explanation;Demonstration;Verbal cues;Handout    Comprehension Verbalized understanding;Returned demonstration;Verbal cues required;Need further instruction            PT Short Term Goals - 02/21/21 1656      PT SHORT TERM GOAL #1   Title Patient will be independent with initial HEP.    Baseline Provided initial HEP only including  PPT at eval 02/21/2021 due to time constraints    Time 4    Period Weeks    Status New    Target Date 03/21/21      PT SHORT TERM GOAL #2   Title Patient will improve BLE MMT to >/= 4/5 grossly    Baseline See flowsheet    Time 4    Period Weeks    Status New    Target Date 03/21/21      PT SHORT TERM GOAL #3   Title Patient will report at least 2 consecutive weeks without falls or instances of knees buckling.    Baseline Patient reports frequent knee buckling (bilaterally) that has resulted in several falls    Time 4    Period Weeks    Status New    Target Date 03/21/21      PT SHORT TERM GOAL #4   Title Patient's FOTO score will increase from 32% to >/= 40% functional status to demonstrate improved perceived ability.    Baseline 32% function; predicted 48% function    Time 4    Period Weeks    Status New    Target Date 03/21/21      PT SHORT TERM GOAL #5   Title Patient will ambulate >/= 500 feet indoors on level ground using SPC or LRAD to demonstrate improved ability to perform household and community ambulation.    Baseline Pt able to ambulate short distances (from  lobby to therapy gym) but expresses being unable to walk around at the mall or for prolonged distances/time periods    Time 4    Period Weeks    Status New    Target Date 03/21/21             PT Long Term Goals - 02/21/21 1712      PT LONG TERM GOAL #1   Title Patient will be independent with advanced HEP.    Baseline Provided initial HEP only consisting of PPT at eval 02/21/2021 due to time constraints    Time 8    Period Weeks    Status New    Target Date 04/18/21      PT LONG TERM GOAL #2   Title Patient will improve BLE MMT to >/= 4+/5 grossly for improved functional strength.    Baseline See flowsheet    Time 8    Period Weeks    Status New    Target Date 04/18/21      PT LONG TERM GOAL #3   Title Patient's FOTO score will increase from 32% to >/= 48% functional status to demonstrate  improved perceived ability.    Baseline 32% function; predicted 48% function    Time 8    Period Weeks    Status New    Target Date 04/18/21      PT LONG TERM GOAL #4   Title Patient will ambulate >/= 1000 feet indoors on level ground using SPC or LRAD to demonstrate improved ability to perform household and community ambulation.    Baseline Pt able to ambulate short distances (from lobby to therapy gym) but expresses being unable to walk around at the mall or for prolonged distances/time periods    Time 8    Period Weeks    Status New    Target Date 04/18/21      PT LONG TERM GOAL #5   Title Patient will report being able to play with his 84 year old son for at least 30 minutes with </= 5/10 low back pain.    Baseline Pt states pain is typically 7/10. He reports one of his primary goals is to be able to get on the floor and play with his son    Time 8    Period Weeks    Status New    Target Date 04/18/21                  Plan - 02/21/21 1719    Clinical Impression Statement Patient is a 40 year old male who presents to [facility] with complaints of chronic bilateral low back pain with bilateral sciatica (L > R). He has a history of L4-S1 ALIF surgery in 2013 during which he reports he was told his sciatic nerve was nicked. His lumbar mobility is limited secondary to history of lumbar fusion with pain during movement in each plane. Pt presents to clinic using SPC in RUE but explains that he also has a hurrycane he sometimes uses. He demonstrates bilateral lower extremity weakness with pain during resisted left ankle dorsiflexion and right knee flexion/extension. Evaluation was limited secondary to time constraints due to patient arriving late, but he was informed of attendance policy and verbalized understanding. He should benefit from skilled PT intervention for improved tolerance with functional activities, such as community ambulation and playing with his 68 year old son, though progress  may be gradual due to patient's extensive history and etiology of symptoms.  Personal Factors and Comorbidities Comorbidity 3+;Fitness;Past/Current Experience;Time since onset of injury/illness/exacerbation    Comorbidities See PMH above    Examination-Activity Limitations Bed Mobility;Bend;Caring for Others;Carry;Lift;Locomotion Level;Dressing;Sit;Sleep;Squat;Stairs;Stand;Toileting;Transfers    Examination-Participation Restrictions Cleaning;Community Activity;Laundry;Meal Prep;Shop;Yard Work    Stability/Clinical Decision Making Evolving/Moderate complexity    Clinical Decision Making Moderate    Rehab Potential Fair    PT Frequency 2x / week    PT Duration 8 weeks    PT Treatment/Interventions ADLs/Self Care Home Management;Aquatic Therapy;Cryotherapy;Electrical Stimulation;Iontophoresis 40m/ml Dexamethasone;Moist Heat;DME Instruction;Neuromuscular re-education;Balance training;Therapeutic exercise;Therapeutic activities;Functional mobility training;Stair training;Gait training;Patient/family education;Manual techniques;Energy conservation;Dry needling;Passive range of motion;Taping    PT Next Visit Plan *Provide blue handout for advance directives information. Complete further assessment (sensation testing, BLE and thoracic/upper lumbar spine mobility, balance - 5xSTS vs TUG vs 6MWT per tolerance, assess SI/leg length if indicated). Check height of SPC/hurrycane and review gait pattern/sequence with AD. Initiate core and LE strengthening as tolerated.    PT Home Exercise Plan [id number]- posterior pelvic tilt    Consulted and Agree with Plan of Care Patient           Patient will benefit from skilled therapeutic intervention in order to improve the following deficits and impairments:  Abnormal gait,Decreased activity tolerance,Decreased balance,Decreased mobility,Decreased strength,Decreased endurance,Decreased range of motion,Difficulty walking,Decreased knowledge of use of DME,Impaired  sensation,Impaired flexibility,Improper body mechanics,Postural dysfunction,Pain,Increased muscle spasms  Visit Diagnosis: Chronic bilateral low back pain with bilateral sciatica  Muscle weakness (generalized)  Difficulty in walking, not elsewhere classified  Other abnormalities of gait and mobility     Problem List Patient Active Problem List   Diagnosis Date Noted  . Hepatic hemangioma 01/17/2021  . Grade III internal hemorrhoids 01/17/2021  . Failed back syndrome 01/03/2021  . Facet arthropathy, lumbar 01/03/2021  . Sleep disturbance 12/06/2020  . Abnormality of gait 12/06/2020  . 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/05/2020  . Hyperlipidemia 06/01/2020  . PTSD (post-traumatic stress disorder)   . Hypertension   . Depression   . Anxiety   . [place]ative [place]  . Myocardial infarction ([facility] 2015   KHaydee Monica PT, DPT 02/21/21 5:42 PM  CDumasCB[facility]ok[facility] System[address] [address]Phone: [phone]  Fax:  [phone] Name: Daniel MichelleMRN: [id number] of Birth: [birth year]

## 2021-02-25 ENCOUNTER — Other Ambulatory Visit: Payer: Self-pay

## 2021-02-25 ENCOUNTER — Ambulatory Visit: Payer: BC Managed Care – PPO

## 2021-02-25 DIAGNOSIS — G8929 Other chronic pain: Secondary | ICD-10-CM

## 2021-02-25 DIAGNOSIS — M5441 Lumbago with sciatica, right side: Secondary | ICD-10-CM | POA: Diagnosis not present

## 2021-02-25 DIAGNOSIS — M6281 Muscle weakness (generalized): Secondary | ICD-10-CM

## 2021-02-25 DIAGNOSIS — R2689 Other abnormalities of gait and mobility: Secondary | ICD-10-CM

## 2021-02-25 DIAGNOSIS — M5442 Lumbago with sciatica, left side: Secondary | ICD-10-CM | POA: Diagnosis not present

## 2021-02-25 DIAGNOSIS — R262 Difficulty in walking, not elsewhere classified: Secondary | ICD-10-CM

## 2021-02-25 NOTE — Therapy (Signed)
[place], [place], [address] Phone: [phone]   Fax:  [phone]  Physical Therapy Treatment  Patient Details  Name: Daniel Marsh MRN: [id number] Date of Birth: 1981-08-20 Referring Provider (PT): Jamse Arn, MD   Encounter Date: 02/25/2021   PT End of Session - 02/25/21 1204    Visit Number 2    Number of Visits 17    Date for PT Re-Evaluation 04/20/21    Authorization Type BCBS - FOTO visit 6 and visit 10    PT Start Time 1205    PT Stop Time 1246    PT Time Calculation (min) 41 min    Activity Tolerance Patient tolerated treatment well;No increased pain    Behavior During Therapy WFL for tasks assessed/performed           Past Medical History:  Diagnosis Date  . Anxiety   . Arthritis   . Depression   . Hypertension   . Myocardial infarction ([place])   . PTSD (post-traumatic stress disorder)   . Ulcerative colitis ([place]) 2019    Past Surgical History:  Procedure Laterality Date  . ANKLE SURGERY Right 2011  . KNEE SURGERY Left 2020  . SPINE SURGERY  2013   Lumbar fusion L4-S1  . WISDOM TOOTH EXTRACTION      There were no vitals filed for this visit.   Subjective Assessment - 02/25/21 1206    Subjective Pt presents to PT with continued reports of lower back pain and referral into bilateral LEs. He has been compliant with his HEP with no adverse effect noted. Pt is ready to begin PT treatment at this time.    Currently in Pain? Yes    Pain Score 7     Pain Location Back    Pain Orientation Right;Left    Pain Descriptors / Indicators Aching;Burning    Pain Type Chronic pain                             [facility] Adult PT Treatment/Exercise - 02/25/21 0001      Lumbar Exercises: Stretches   Figure 4 Stretch 1 rep;30 seconds    Figure 4 Stretch Limitations --   seated   Other Lumbar Stretch Exercise seated sciatic nerve glide 2x15 ea;      Lumbar  Exercises: Seated   Other Seated Lumbar Exercises seated clamshell green tband 3x15      Lumbar Exercises: Supine   Pelvic Tilt Limitations --   PPT x 10 - 5 sec hold; PPT w/ ball squeeze 2x10 - 5 sec hold   Bridge --   2x8 - 3 sec hold     Lumbar Exercises: Prone   Other Prone Lumbar Exercises POE x 2 min                  PT Education - 02/25/21 1309    Education Details addtional exercises added to HEP    Person(s) Educated Patient    Methods Explanation;Demonstration;Handout;Verbal cues    Comprehension Verbalized understanding;Returned demonstration            PT Short Term Goals - 02/21/21 1656      PT SHORT TERM GOAL #1   Title Patient will be independent with initial HEP.    Baseline Provided initial HEP only including PPT at eval 02/21/2021 due to time constraints    Time 4    Period Weeks  Status New    Target Date 03/21/21      PT SHORT TERM GOAL #2   Title Patient will improve BLE MMT to >/= 4/5 grossly    Baseline See flowsheet    Time 4    Period Weeks    Status New    Target Date 03/21/21      PT SHORT TERM GOAL #3   Title Patient will report at least 2 consecutive weeks without falls or instances of knees buckling.    Baseline Patient reports frequent knee buckling (bilaterally) that has resulted in several falls    Time 4    Period Weeks    Status New    Target Date 03/21/21      PT SHORT TERM GOAL #4   Title Patient's FOTO score will increase from 32% to >/= 40% functional status to demonstrate improved perceived ability.    Baseline 32% function; predicted 48% function    Time 4    Period Weeks    Status New    Target Date 03/21/21      PT SHORT TERM GOAL #5   Title Patient will ambulate >/= 500 feet indoors on level ground using SPC or LRAD to demonstrate improved ability to perform household and community ambulation.    Baseline Pt able to ambulate short distances (from lobby to therapy gym) but expresses being unable to walk  around at the mall or for prolonged distances/time periods    Time 4    Period Weeks    Status New    Target Date 03/21/21             PT Long Term Goals - 02/21/21 1712      PT LONG TERM GOAL #1   Title Patient will be independent with advanced HEP.    Baseline Provided initial HEP only consisting of PPT at eval 02/21/2021 due to time constraints    Time 8    Period Weeks    Status New    Target Date 04/18/21      PT LONG TERM GOAL #2   Title Patient will improve BLE MMT to >/= 4+/5 grossly for improved functional strength.    Baseline See flowsheet    Time 8    Period Weeks    Status New    Target Date 04/18/21      PT LONG TERM GOAL #3   Title Patient's FOTO score will increase from 32% to >/= 48% functional status to demonstrate improved perceived ability.    Baseline 32% function; predicted 48% function    Time 8    Period Weeks    Status New    Target Date 04/18/21      PT LONG TERM GOAL #4   Title Patient will ambulate >/= 1000 feet indoors on level ground using SPC or LRAD to demonstrate improved ability to perform household and community ambulation.    Baseline Pt able to ambulate short distances (from lobby to therapy gym) but expresses being unable to walk around at the mall or for prolonged distances/time periods    Time 8    Period Weeks    Status New    Target Date 04/18/21      PT LONG TERM GOAL #5   Title Patient will report being able to play with his 38 year old son for at least 30 minutes with </= 5/10 low back pain.    Baseline Pt states pain is typically 7/10. He reports one  of his primary goals is to be able to get on the floor and play with his son    Time 8    Period Weeks    Status New    Target Date 04/18/21                 Plan - 02/25/21 1309    Clinical Impression Statement Pt was once again able to complete all prescribed therapeutic exercises with no adverse effect or increase in pain. He continues to demonstrated decreased  core endurance and proximal hip muscle weakness that limits his functional mobility and activity level. PT will continue to progress therapeutic exercises as tolerated per POC as prescribed.    PT Treatment/Interventions ADLs/Self Care Home Management;Aquatic Therapy;Cryotherapy;Electrical Stimulation;Iontophoresis 41m/ml Dexamethasone;Moist Heat;DME Instruction;Neuromuscular re-education;Balance training;Therapeutic exercise;Therapeutic activities;Functional mobility training;Stair training;Gait training;Patient/family education;Manual techniques;Energy conservation;Dry needling;Passive range of motion;Taping    PT Next Visit Plan *Provide blue handout for advance directives information. Complete further assessment (sensation testing, BLE and thoracic/upper lumbar spine mobility, balance - 5xSTS vs TUG vs 6MWT per tolerance, assess SI/leg length if indicated). Check height of SPC/hurrycane and review gait pattern/sequence with AD. Initiate core and LE strengthening as tolerated.    PT Home Exercise Plan [id number]           Patient will benefit from skilled therapeutic intervention in order to improve the following deficits and impairments:     Visit Diagnosis: Chronic bilateral low back pain with bilateral sciatica  Muscle weakness (generalized)  Difficulty in walking, not elsewhere classified  Other abnormalities of gait and mobility     Problem List Patient Active Problem List   Diagnosis Date Noted  . Hepatic hemangioma 01/17/2021  . Grade III internal hemorrhoids 01/17/2021  . Failed back syndrome 01/03/2021  . Facet arthropathy, lumbar 01/03/2021  . Sleep disturbance 12/06/2020  . Abnormality of gait 12/06/2020  . 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/05/2020  . Hyperlipidemia 06/01/2020  . PTSD (post-traumatic stress disorder)   . Hypertension   . Depression   . Anxiety   . [place]  . Myocardial infarction ([facility] 2015   DWard Chatters PT, DPT 02/25/21 1:17 PM  CB[facility] LLC[address] [address]Phone: [phone]  Fax:  [phone] Name: CDamiel BartholdMRN: [id number] of Birth: 102-28-1982

## 2021-02-28 ENCOUNTER — Other Ambulatory Visit: Payer: Self-pay

## 2021-02-28 ENCOUNTER — Ambulatory Visit: Payer: BC Managed Care – PPO

## 2021-02-28 DIAGNOSIS — M5441 Lumbago with sciatica, right side: Secondary | ICD-10-CM | POA: Diagnosis not present

## 2021-02-28 DIAGNOSIS — R2689 Other abnormalities of gait and mobility: Secondary | ICD-10-CM | POA: Diagnosis not present

## 2021-02-28 DIAGNOSIS — R262 Difficulty in walking, not elsewhere classified: Secondary | ICD-10-CM | POA: Diagnosis not present

## 2021-02-28 DIAGNOSIS — G8929 Other chronic pain: Secondary | ICD-10-CM

## 2021-02-28 DIAGNOSIS — M5442 Lumbago with sciatica, left side: Secondary | ICD-10-CM

## 2021-02-28 DIAGNOSIS — M6281 Muscle weakness (generalized): Secondary | ICD-10-CM

## 2021-02-28 NOTE — Therapy (Signed)
[place], [place], [address] Phone: [phone]   Fax:  [phone]  Physical Therapy Treatment  Patient Details  Name: Daniel Marsh MRN: [id number] Date of Birth: 13-Sep-1981 Referring Provider (PT): Jamse Arn, MD   Encounter Date: 02/28/2021   PT End of Session - 02/28/21 1536    Visit Number 3    Number of Visits 17    Date for PT Re-Evaluation 04/20/21    Authorization Type BCBS - FOTO visit 6 and visit 10    PT Start Time 1536    PT Stop Time 1615    PT Time Calculation (min) 39 min    Activity Tolerance Patient tolerated treatment well;No increased pain    Behavior During Therapy WFL for tasks assessed/performed           Past Medical History:  Diagnosis Date  . Anxiety   . Arthritis   . Depression   . Hypertension   . Myocardial infarction ([place])   . PTSD (post-traumatic stress disorder)   . Ulcerative colitis ([place]) 2019    Past Surgical History:  Procedure Laterality Date  . ANKLE SURGERY Right 2011  . KNEE SURGERY Left 2020  . SPINE SURGERY  2013   Lumbar fusion L4-S1  . WISDOM TOOTH EXTRACTION      There were no vitals filed for this visit.   Subjective Assessment - 02/28/21 1536    Subjective Pt presents to PT with continued reports of lower back pain as well as referral into L LE. He has been compliant with his HEP with no adverse effects noted. Pt is ready to begin PT treatment at this time.    Currently in Pain? Yes    Pain Score 8     Pain Location Back    Pain Orientation Lower;Right;Left    Pain Descriptors / Indicators Aching    Pain Radiating Towards posterior L LE                  [facility] Adult PT Treatment/Exercise - 02/28/21 0001      Lumbar Exercises: Aerobic   Nustep lvl 5 x 5 min while taking subjective      Lumbar Exercises: Seated   Sit to Stand --   2x10 holding 6lb DB   Other Seated Lumbar Exercises seated sciatic nerve glide  2 x 15 ea      Lumbar Exercises: Supine   Pelvic Tilt 10 reps;5 seconds    Pelvic Tilt Limitations --   2x10 w/ ball squeeze - 5 sec hold   Bridge --   2x10 - 3 sec hold   Other Supine Lumbar Exercises --   reverse crunch 2x8; hamstring ball rollouts 2x10 green swiss ball                   PT Short Term Goals - 02/21/21 1656      PT SHORT TERM GOAL #1   Title Patient will be independent with initial HEP.    Baseline Provided initial HEP only including PPT at eval 02/21/2021 due to time constraints    Time 4    Period Weeks    Status New    Target Date 03/21/21      PT SHORT TERM GOAL #2   Title Patient will improve BLE MMT to >/= 4/5 grossly    Baseline See flowsheet    Time 4    Period Weeks    Status New  Target Date 03/21/21      PT SHORT TERM GOAL #3   Title Patient will report at least 2 consecutive weeks without falls or instances of knees buckling.    Baseline Patient reports frequent knee buckling (bilaterally) that has resulted in several falls    Time 4    Period Weeks    Status New    Target Date 03/21/21      PT SHORT TERM GOAL #4   Title Patient's FOTO score will increase from 32% to >/= 40% functional status to demonstrate improved perceived ability.    Baseline 32% function; predicted 48% function    Time 4    Period Weeks    Status New    Target Date 03/21/21      PT SHORT TERM GOAL #5   Title Patient will ambulate >/= 500 feet indoors on level ground using SPC or LRAD to demonstrate improved ability to perform household and community ambulation.    Baseline Pt able to ambulate short distances (from lobby to therapy gym) but expresses being unable to walk around at the mall or for prolonged distances/time periods    Time 4    Period Weeks    Status New    Target Date 03/21/21             PT Long Term Goals - 02/21/21 1712      PT LONG TERM GOAL #1   Title Patient will be independent with advanced HEP.    Baseline Provided initial  HEP only consisting of PPT at eval 02/21/2021 due to time constraints    Time 8    Period Weeks    Status New    Target Date 04/18/21      PT LONG TERM GOAL #2   Title Patient will improve BLE MMT to >/= 4+/5 grossly for improved functional strength.    Baseline See flowsheet    Time 8    Period Weeks    Status New    Target Date 04/18/21      PT LONG TERM GOAL #3   Title Patient's FOTO score will increase from 32% to >/= 48% functional status to demonstrate improved perceived ability.    Baseline 32% function; predicted 48% function    Time 8    Period Weeks    Status New    Target Date 04/18/21      PT LONG TERM GOAL #4   Title Patient will ambulate >/= 1000 feet indoors on level ground using SPC or LRAD to demonstrate improved ability to perform household and community ambulation.    Baseline Pt able to ambulate short distances (from lobby to therapy gym) but expresses being unable to walk around at the mall or for prolonged distances/time periods    Time 8    Period Weeks    Status New    Target Date 04/18/21      PT LONG TERM GOAL #5   Title Patient will report being able to play with his 22 year old son for at least 30 minutes with </= 5/10 low back pain.    Baseline Pt states pain is typically 7/10. He reports one of his primary goals is to be able to get on the floor and play with his son    Time 8    Period Weeks    Status New    Target Date 04/18/21  Plan - 02/28/21 1621    Clinical Impression Statement Pt was once again able to complete prescribed exercises with progressions with no increase in pain noted. Pt does continue to demo difficulty with higher level core exercises with proper motor control, keeping lumbar spine in neutral. He continues to benefit from skilled PT services working on improving core and LE strength. PT will continue to progress exercises as tolerated per POC as prescribed.    PT Treatment/Interventions ADLs/Self Care  Home Management;Aquatic Therapy;Cryotherapy;Electrical Stimulation;Iontophoresis 58m/ml Dexamethasone;Moist Heat;DME Instruction;Neuromuscular re-education;Balance training;Therapeutic exercise;Therapeutic activities;Functional mobility training;Stair training;Gait training;Patient/family education;Manual techniques;Energy conservation;Dry needling;Passive range of motion;Taping    PT Next Visit Plan check height of hurrycane; continue to progress exercises as tolerated; manual therapy if indicated    PT Home Exercise Plan [id number]           Patient will benefit from skilled therapeutic intervention in order to improve the following deficits and impairments:  Abnormal gait,Decreased activity tolerance,Decreased balance,Decreased mobility,Decreased strength,Decreased endurance,Decreased range of motion,Difficulty walking,Decreased knowledge of use of DME,Impaired sensation,Impaired flexibility,Improper body mechanics,Postural dysfunction,Pain,Increased muscle spasms  Visit Diagnosis: Chronic bilateral low back pain with bilateral sciatica  Muscle weakness (generalized)  Difficulty in walking, not elsewhere classified  Other abnormalities of gait and mobility     Problem List Patient Active Problem List   Diagnosis Date Noted  . Hepatic hemangioma 01/17/2021  . Grade III internal hemorrhoids 01/17/2021  . Failed back syndrome 01/03/2021  . Facet arthropathy, lumbar 01/03/2021  . Sleep disturbance 12/06/2020  . Abnormality of gait 12/06/2020  . 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/05/2020  . Hyperlipidemia 06/01/2020  . PTSD (post-traumatic stress disorder)   . Hypertension   . Depression   . Anxiety   . [place]ative [place]  . Myocardial infarction ([facility] 2015    DWard Chatters PT, DPT 02/28/21 4:26 PM  CK[facility]CE[facility] LLC[address] [address]Phone: [phone]  Fax:   [phone] Name: CAhmod GillespieMRN: [id number] of Birth: 101/07/1981

## 2021-03-06 ENCOUNTER — Ambulatory Visit (INDEPENDENT_AMBULATORY_CARE_PROVIDER_SITE_OTHER): Payer: BC Managed Care – PPO | Admitting: Gastroenterology

## 2021-03-06 ENCOUNTER — Encounter: Payer: Self-pay | Admitting: Gastroenterology

## 2021-03-06 ENCOUNTER — Telehealth: Payer: Self-pay | Admitting: General Surgery

## 2021-03-06 ENCOUNTER — Other Ambulatory Visit: Payer: Self-pay

## 2021-03-06 ENCOUNTER — Ambulatory Visit (INDEPENDENT_AMBULATORY_CARE_PROVIDER_SITE_OTHER): Payer: BC Managed Care – PPO | Admitting: Psychologist

## 2021-03-06 VITALS — BP 128/84 | HR 80 | Ht 72.0 in | Wt 216.5 lb

## 2021-03-06 DIAGNOSIS — K921 Melena: Secondary | ICD-10-CM | POA: Diagnosis not present

## 2021-03-06 DIAGNOSIS — K641 Second degree hemorrhoids: Secondary | ICD-10-CM | POA: Diagnosis not present

## 2021-03-06 DIAGNOSIS — F1010 Alcohol abuse, uncomplicated: Secondary | ICD-10-CM

## 2021-03-06 DIAGNOSIS — R109 Unspecified abdominal pain: Secondary | ICD-10-CM

## 2021-03-06 DIAGNOSIS — Z634 Disappearance and death of family member: Secondary | ICD-10-CM

## 2021-03-06 DIAGNOSIS — D1803 Hemangioma of intra-abdominal structures: Secondary | ICD-10-CM | POA: Diagnosis not present

## 2021-03-06 DIAGNOSIS — F4310 Post-traumatic stress disorder, unspecified: Secondary | ICD-10-CM

## 2021-03-06 DIAGNOSIS — F331 Major depressive disorder, recurrent, moderate: Secondary | ICD-10-CM | POA: Diagnosis not present

## 2021-03-06 DIAGNOSIS — K219 Gastro-esophageal reflux disease without esophagitis: Secondary | ICD-10-CM

## 2021-03-06 DIAGNOSIS — K51911 Ulcerative colitis, unspecified with rectal bleeding: Secondary | ICD-10-CM | POA: Diagnosis not present

## 2021-03-06 MED ORDER — AMBULATORY NON FORMULARY MEDICATION: 1 refills | Status: DC

## 2021-03-06 NOTE — Progress Notes (Signed)
P  Chief Complaint:    Symptomatic Internal Hemorrhoids; Hemorrhoid Band Ligation  GI History: 40 year old male with history of PTSD, chronic anxiety, HTN, lumbar fusion, follows in the GI clinic for the following:  1) Hx of Ulcerative Colitis.  Diagnosed at OSH in 2019.  Previously managed with Apriso, with breakthrough symptoms when he ran out of medications in 2021.  Treated in 07/2020 with steroid taper and resuming Apriso 1.5 g/day.   -Colonoscopy (07/2020, Dr. Bryan Lemma): 4 mm sigmoid hyperplastic polyp, mild scattered sigmoid erythema (path benign, no active/chronic inflammation).  Grade 3 internal hemorrhoids and external hemorrhoids  2) Symptomatic hemorrhoids.  Characterized by hematochezia, rectal discomfort.  Grade 3 hemorrhoids noted on colonoscopy. -02/07/2021: Successful banding of the RP hemorrhoid -03/06/2021: Presents for hemorrhoid banding #2  3) History of GERD, well-controlled omeprazole 40 mg/day.  4) Hepatic angioma -CT abdomen/pelvis (10/2020): Indeterminate hypodensities in the liver, majority too small to characterize, measuring up to 1.3 cm.  Otherwise normal abdomen -MRI abdomen (12/2020): 12 mm well-circumscribed lesion in right hepatic lobe c/w benign hemangioma.  Several subcentimeter nonenhancing benign hepatic cysts  -Liver enzymes normal -02/01/2021: Was seen in the [facility].  Agreed with diagnosis of hepatic angioma with plan for repeat MRI in 6 months to ensure size stability along with checking viral hepatitis panels for appropriate immunity  HPI:     Patient is a 40 y.o. malewith a history of symptomatic internal hemorrhoids presenting to the Gastroenterology Clinic for follow-up and ongoing treatment. The patient presents with symptomatic grade 2-3 hemorrhoids, unresponsive to maximal medical therapy, requesting rubber band ligation of symptomatic hemorrhoidal disease.  Did well with the first hemorrhoid banding last month.  He  does report recent bloody diarrhea/bloody stools occurring 2-3 days/week.  Shows me pictures on his phone.  He is concerned that this is breakthrough with [place] despite continuing Apriso.  Also has abdominal cramping with these episodes, for which he uses Bentyl, but possibly less efficacious lately.    No change in medical or surgical history, medications, allergies, social history since last appointment with me.  No new labs or abdominal imaging for review today.  Review of systems:     No chest pain, no SOB, no fevers, no urinary sx   Past Medical History:  Diagnosis Date  . Anxiety   . Arthritis   . Depression   . Hypertension   . Myocardial infarction ([place])   . PTSD (post-traumatic stress disorder)   . Ulcerative colitis ([place]) 2019    Patient's surgical history, family medical history, social history, medications and allergies were all reviewed in Epic    Current Outpatient Medications  Medication Sig Dispense Refill  . amitriptyline (ELAVIL) 25 MG tablet Take 1 tablet (25 mg total) by mouth at bedtime. 30 tablet 1  . diazepam (VALIUM) 5 MG tablet Take 1 tablet (5 mg total) by mouth at bedtime. 30 tablet 2  . dicyclomine (BENTYL) 10 MG capsule Take 1 capsule (10 mg total) by mouth 3 (three) times daily as needed (abdominal pain/ cramping). 90 capsule 6  . gabapentin (NEURONTIN) 600 MG tablet Take 2 tablets (1,200 mg total) by mouth 3 (three) times daily. 180 tablet 1  . hydrocortisone (ANUSOL-HC) 25 MG suppository Use 1 suppository rectally at night time for 5-7 night, then use as needed for hemorrhoidal bleeding. (Patient taking differently: Place 25 mg rectally as needed. Use 1 suppository rectally at night time for 5-7 night, then use as needed for hemorrhoidal bleeding.) 12  suppository 2  . mesalamine (APRISO) 0.375 g 24 hr capsule Take 1 capsule (0.375 g total) by mouth in the morning, at noon, in the evening, and at bedtime. 120 capsule 3  . omeprazole (PRILOSEC) 40 MG capsule  Take 1 capsule (40 mg total) by mouth daily. 90 capsule 3  . prazosin (MINIPRESS) 1 MG capsule Take 2 capsules (2 mg total) by mouth at bedtime. 180 capsule 1  . vortioxetine HBr (TRINTELLIX) 10 MG TABS tablet Take 1 tablet (10 mg total) by mouth daily. 90 tablet 1   No current facility-administered medications for this visit.    Physical Exam:     There were no vitals taken for this visit.  GENERAL:  Pleasant male in NAD PSYCH: : Cooperative, normal affect NEURO: Alert and oriented x 3, no focal neurologic deficits Rectal exam: Sensation intact and preserved anal wink.   External anal fissure in 9 o'clock position.  Skin tags.  Grade 2 hemorrhoids noted in LL and RA positions on exam.  No palpable mass. No blood on the exam glove. (Chaperone: Curlene Labrum, CMA).   IMPRESSION and PLAN:    #1.  Symptomatic internal hemorrhoids: PROCEDURE NOTE: The patient presents with symptomatic grade 2-3 hemorrhoids, unresponsive to maximal medical therapy, requesting rubber band ligation of symptomatic hemorrhoidal disease.  All risks, benefits and alternative forms of therapy were described and informed consent was obtained.  In the Left Lateral Decubitus position, anoscopic examination revealed grade 2 hemorrhoids in the LL/RA position(s).  The anorectum was pre-medicated with RectiCare. The decision was made to band the LL internal hemorrhoid, and the [place] was used to perform band ligation without complication.  Digital anorectal examination was then performed to assure proper positioning of the band, and to adjust the banded tissue as required.  The patient was discharged home without pain or other issues.  Dietary and behavioral recommendations were given and along with follow-up instructions.     The following adjunctive treatments were recommended:  -Resume high-fiber diet with fiber supplement (i.e. Citrucel or Benefiber) with goal for soft stools without straining to have a  BM. -Resume adequate fluid intake.  The patient will return 6 weeks for follow-up and possible additional banding as required. No complications were encountered and the patient tolerated the procedure well.      #2. Anal Fissure: Physical exam notable for external anal fissure in the 9:00 position. Will treat as below:  - Start topical NTG 0.125%, apply a small, pea-sized amount to the affected area BID for 6-8 weeks.  - We discussed the ADR of headache, and if patient does experience, to call me and will change and make pharmacy request to compound topical CCB (topical nifedipine 0.2-0.3% applied 2-4 times daily; unfortunately, the CCB also carries an ADR of headache in 5-12%). Additionally, cautioned to avoid strenuous activity within 30 minutes of application  - Sitz bath with warm water for 10-15 minutes 2-3 times daily; directed to [employer] available online or at [employer]; ensure to dry area afterwards  #3. Ulcerative Colitis -Unclear if recent breakthrough symptoms are from UC vs anal fissure vs continued symptomatic hemorrhoids -Check ESR, CRP, CBC, GI PCR panel, fecal calprotectin -Continue Apriso 1.5 g/day for now -Up-to-date on surveillance -If continued symptomatology despite treatment of the above, consider flexible sigmoidoscopy  #4.  Hepatic hemangioma -Was seen in the [facility] with plan for repeat MRI at 6 months to ensure size stability.  He will follow-up there for  that MRI     #5.  GERD -Well-controlled on omeprazole -Continue antireflux lifestyle/dietary modifications  I spent an additional 20 minutes of nonprocedural time, including independent review of results as outlined above, communicating results with the patient directly, face-to-face time with the patient, coordinating care, ordering studies and medications as appropriate, and documentation.         Ajo ,DO, FACG 03/06/2021, 3:12 PM

## 2021-03-06 NOTE — Patient Instructions (Addendum)
If you are age 38 or older, your body mass index should be between 23-30. Your Body mass index is 29.36 kg/m. If this is out of the aforementioned range listed, please consider follow up with your Primary Care Provider.  If you are age 76 or younger, your body mass index should be between 19-25. Your Body mass index is 29.36 kg/m. If this is out of the aformentioned range listed, please consider follow up with your Primary Care Provider.   HEMORRHOID BANDING PROCEDURE    FOLLOW-UP CARE   1. The procedure you have had should have been relatively painless since the banding of the area involved does not have nerve endings and there is no pain sensation.  The rubber band cuts off the blood supply to the hemorrhoid and the band may fall off as soon as 48 hours after the banding (the band may occasionally be seen in the toilet bowl following a bowel movement). You may notice a temporary feeling of fullness in the rectum which should respond adequately to plain Tylenol or Motrin.  2. Following the banding, avoid strenuous exercise that evening and resume full activity the next day.  A sitz bath (soaking in a warm tub) or bidet is soothing, and can be useful for cleansing the area after bowel movements.     3. To avoid constipation, take two tablespoons of natural wheat bran, natural oat bran, flax, Benefiber or any over the counter fiber supplement and increase your water intake to 7-8 glasses daily.    4. Unless you have been prescribed anorectal medication, do not put anything inside your rectum for two weeks: No suppositories, enemas, fingers, etc.  5. Occasionally, you may have more bleeding than usual after the banding procedure.  This is often from the untreated hemorrhoids rather than the treated one.  Don't be concerned if there is a tablespoon or so of blood.  If there is more blood than this, lie flat with your bottom higher than your head and apply an ice pack to the area. If the bleeding  does not stop within a half an hour or if you feel faint, call our office at [phone] or go to the emergency room.  6. Problems are not common; however, if there is a substantial amount of bleeding, severe pain, chills, fever or difficulty passing urine (very rare) or other problems, you should call us at [phone] or report to the nearest emergency room.  7. Do not stay seated continuously for more than 2-3 hours for a day or two after the procedure.  Tighten your buttock muscles 10-15 times every two hours and take 10-15 deep breaths every 1-2 hours.  Do not spend more than a few minutes on the toilet if you cannot empty your bowel; instead re-visit the toilet at a later time.   Please go to the 2nd floor of this building today and schedule your labwork, [address] [place], [address].   Follow up in 6 weeks for possible banding.  Thank you for choosing me and [facility].  Vito Cirigliano, D.O.

## 2021-03-06 NOTE — Telephone Encounter (Signed)
Notified the patient Nitro ointment is compounded at [facility] in [place]. Will send directions and phone number to the patient via mychart.

## 2021-03-07 ENCOUNTER — Ambulatory Visit: Payer: BC Managed Care – PPO

## 2021-03-08 ENCOUNTER — Other Ambulatory Visit (INDEPENDENT_AMBULATORY_CARE_PROVIDER_SITE_OTHER): Payer: BC Managed Care – PPO

## 2021-03-08 ENCOUNTER — Other Ambulatory Visit: Payer: Self-pay

## 2021-03-08 DIAGNOSIS — K51911 Ulcerative colitis, unspecified with rectal bleeding: Secondary | ICD-10-CM

## 2021-03-08 DIAGNOSIS — R109 Unspecified abdominal pain: Secondary | ICD-10-CM

## 2021-03-08 DIAGNOSIS — D1803 Hemangioma of intra-abdominal structures: Secondary | ICD-10-CM | POA: Diagnosis not present

## 2021-03-08 DIAGNOSIS — K921 Melena: Secondary | ICD-10-CM | POA: Diagnosis not present

## 2021-03-08 NOTE — Addendum Note (Signed)
Addended by: Kelle Darting A on: 03/08/2021 03:28 PM   Modules accepted: Orders

## 2021-03-09 LAB — CBC
HCT: 42.3 % (ref 38.5–50.0)
Hemoglobin: 13.9 g/dL (ref 13.2–17.1)
MCH: 29.6 pg (ref 27.0–33.0)
MCHC: 32.9 g/dL (ref 32.0–36.0)
MCV: 90.2 fL (ref 80.0–100.0)
MPV: 10.8 fL (ref 7.5–12.5)
Platelets: 323 10*3/uL (ref 140–400)
RBC: 4.69 10*6/uL (ref 4.20–5.80)
RDW: 13.4 % (ref 11.0–15.0)
WBC: 6.3 10*3/uL (ref 3.8–10.8)

## 2021-03-09 LAB — BASIC METABOLIC PANEL
BUN: 9 mg/dL (ref 7–25)
CO2: 28 mmol/L (ref 20–32)
Calcium: 9.8 mg/dL (ref 8.6–10.3)
Chloride: 108 mmol/L (ref 98–110)
Creat: 1.07 mg/dL (ref 0.60–1.35)
Glucose, Bld: 84 mg/dL (ref 65–99)
Potassium: 4.8 mmol/L (ref 3.5–5.3)
Sodium: 142 mmol/L (ref 135–146)

## 2021-03-09 LAB — C-REACTIVE PROTEIN: CRP: 2.2 mg/L (ref <8.0)

## 2021-03-09 LAB — SEDIMENTATION RATE: Sed Rate: 2 mm/h (ref 0–15)

## 2021-03-12 ENCOUNTER — Other Ambulatory Visit: Payer: Self-pay

## 2021-03-12 ENCOUNTER — Ambulatory Visit: Payer: BC Managed Care – PPO | Attending: Physical Medicine & Rehabilitation

## 2021-03-12 DIAGNOSIS — M5442 Lumbago with sciatica, left side: Secondary | ICD-10-CM | POA: Insufficient documentation

## 2021-03-12 DIAGNOSIS — M5441 Lumbago with sciatica, right side: Secondary | ICD-10-CM | POA: Diagnosis not present

## 2021-03-12 DIAGNOSIS — G8929 Other chronic pain: Secondary | ICD-10-CM | POA: Diagnosis not present

## 2021-03-12 DIAGNOSIS — R262 Difficulty in walking, not elsewhere classified: Secondary | ICD-10-CM | POA: Insufficient documentation

## 2021-03-12 DIAGNOSIS — M6281 Muscle weakness (generalized): Secondary | ICD-10-CM | POA: Insufficient documentation

## 2021-03-12 DIAGNOSIS — R2689 Other abnormalities of gait and mobility: Secondary | ICD-10-CM | POA: Diagnosis not present

## 2021-03-12 NOTE — Therapy (Signed)
[place], [place], [address] Phone: [phone]   Fax:  [phone]  Physical Therapy Treatment  Patient Details  Name: Daniel Marsh MRN: [id number] Date of Birth: 1981/05/26 Referring Provider (PT): Jamse Arn, MD   Encounter Date: 03/12/2021   PT End of Session - 03/12/21 1501    Visit Number 4    Number of Visits 17    Date for PT Re-Evaluation 04/20/21    Authorization Type BC[employer] - FOTO visit 6 and visit 10    PT Start Time 1500   patient arrived late   PT Stop Time 1529    PT Time Calculation (min) 29 min    Activity Tolerance Patient tolerated treatment well;No increased pain    Behavior During Therapy WFL for tasks assessed/performed           Past Medical History:  Diagnosis Date  . Anxiety   . Arthritis   . Depression   . Hypertension   . Myocardial infarction ([place])   . PTSD (post-traumatic stress disorder)   . Ulcerative colitis ([place]) 2019    Past Surgical History:  Procedure Laterality Date  . ANKLE SURGERY Right 2011  . KNEE SURGERY Left 2020  . SPINE SURGERY  2013   Lumbar fusion L4-S1  . WISDOM TOOTH EXTRACTION      There were no vitals filed for this visit.   Subjective Assessment - 03/12/21 1502    Subjective Pt presents to PT with reports of continued LBP and discomfort. He had procedure recently to repair hemmoroids last week with some discomfort noted. Pt has been compliant with HEP with no adverse effect. He is ready to begin PT treatment at this time .    Currently in Pain? Yes    Pain Score 8     Pain Location Back    Pain Orientation Right;Left;Lower    Pain Descriptors / Indicators Aching    Pain Type Chronic pain                             [facility] Adult PT Treatment/Exercise - 03/12/21 0001      Exercises   Exercises Knee/Hip      Lumbar Exercises: Stretches   Lower Trunk Rotation Limitations x 10 ea      Lumbar  Exercises: Aerobic   Nustep lvl 5 x 3 min while taking subjective      Lumbar Exercises: Seated   Sit to Stand 10 reps   sitting on2 in step     Lumbar Exercises: Supine   Pelvic Tilt 10 reps;5 seconds    Pelvic Tilt Limitations supine PPT w/ ball squeeze 2x10 - 5 sec hold    Bridge 10 reps   2 sets     Knee/Hip Exercises: Standing   Hip Abduction 2 sets;10 reps;Both    Hip Extension 2 sets;10 reps;Both    Functional Squat 10 reps   w/ UE support                   PT Short Term Goals - 02/21/21 1656      PT SHORT TERM GOAL #1   Title Patient will be independent with initial HEP.    Baseline Provided initial HEP only including PPT at eval 02/21/2021 due to time constraints    Time 4    Period Weeks    Status New    Target Date 03/21/21  PT SHORT TERM GOAL #2   Title Patient will improve BLE MMT to >/= 4/5 grossly    Baseline See flowsheet    Time 4    Period Weeks    Status New    Target Date 03/21/21      PT SHORT TERM GOAL #3   Title Patient will report at least 2 consecutive weeks without falls or instances of knees buckling.    Baseline Patient reports frequent knee buckling (bilaterally) that has resulted in several falls    Time 4    Period Weeks    Status New    Target Date 03/21/21      PT SHORT TERM GOAL #4   Title Patient's FOTO score will increase from 32% to >/= 40% functional status to demonstrate improved perceived ability.    Baseline 32% function; predicted 48% function    Time 4    Period Weeks    Status New    Target Date 03/21/21      PT SHORT TERM GOAL #5   Title Patient will ambulate >/= 500 feet indoors on level ground using SPC or LRAD to demonstrate improved ability to perform household and community ambulation.    Baseline Pt able to ambulate short distances (from lobby to therapy gym) but expresses being unable to walk around at the mall or for prolonged distances/time periods    Time 4    Period Weeks    Status New     Target Date 03/21/21             PT Long Term Goals - 02/21/21 1712      PT LONG TERM GOAL #1   Title Patient will be independent with advanced HEP.    Baseline Provided initial HEP only consisting of PPT at eval 02/21/2021 due to time constraints    Time 8    Period Weeks    Status New    Target Date 04/18/21      PT LONG TERM GOAL #2   Title Patient will improve BLE MMT to >/= 4+/5 grossly for improved functional strength.    Baseline See flowsheet    Time 8    Period Weeks    Status New    Target Date 04/18/21      PT LONG TERM GOAL #3   Title Patient's FOTO score will increase from 32% to >/= 48% functional status to demonstrate improved perceived ability.    Baseline 32% function; predicted 48% function    Time 8    Period Weeks    Status New    Target Date 04/18/21      PT LONG TERM GOAL #4   Title Patient will ambulate >/= 1000 feet indoors on level ground using SPC or LRAD to demonstrate improved ability to perform household and community ambulation.    Baseline Pt able to ambulate short distances (from lobby to therapy gym) but expresses being unable to walk around at the mall or for prolonged distances/time periods    Time 8    Period Weeks    Status New    Target Date 04/18/21      PT LONG TERM GOAL #5   Title Patient will report being able to play with his 38 year old son for at least 30 minutes with </= 5/10 low back pain.    Baseline Pt states pain is typically 7/10. He reports one of his primary goals is to be able to get on the floor  and play with his son    Time 8    Period Weeks    Status New    Target Date 04/18/21                 Plan - 03/12/21 1626    Clinical Impression Statement Pt tolerated treatment fair, but continues to have signifcant lower back and R knee pain that has made progressions of exercises difficult. He does continue to benefit from skilled PT working on improving and LE strength. PT will continue with POC and progress  exercises as tolerated.    PT Treatment/Interventions ADLs/Self Care Home Management;Aquatic Therapy;Cryotherapy;Electrical Stimulation;Iontophoresis 6m/ml Dexamethasone;Moist Heat;DME Instruction;Neuromuscular re-education;Balance training;Therapeutic exercise;Therapeutic activities;Functional mobility training;Stair training;Gait training;Patient/family education;Manual techniques;Energy conservation;Dry needling;Passive range of motion;Taping    PT Next Visit Plan check height of hurrycane; continue to progress exercises as tolerated; manual therapy if indicated    PT Home Exercise Plan [id number]           Patient will benefit from skilled therapeutic intervention in order to improve the following deficits and impairments:  Abnormal gait,Decreased activity tolerance,Decreased balance,Decreased mobility,Decreased strength,Decreased endurance,Decreased range of motion,Difficulty walking,Decreased knowledge of use of DME,Impaired sensation,Impaired flexibility,Improper body mechanics,Postural dysfunction,Pain,Increased muscle spasms  Visit Diagnosis: Chronic bilateral low back pain with bilateral sciatica  Muscle weakness (generalized)  Difficulty in walking, not elsewhere classified  Other abnormalities of gait and mobility     Problem List Patient Active Problem List   Diagnosis Date Noted  . Hepatic hemangioma 01/17/2021  . Grade III internal hemorrhoids 01/17/2021  . Failed back syndrome 01/03/2021  . Facet arthropathy, lumbar 01/03/2021  . Sleep disturbance 12/06/2020  . Abnormality of gait 12/06/2020  . 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/05/2020  . Hyperlipidemia 06/01/2020  . PTSD (post-traumatic stress disorder)   . Hypertension   . Depression   . Anxiety   . [place]  . Myocardial infarction ([facility] 2015    DWard Chatters PT, DPT 03/12/21 4:28 PM  [facility]GLake [place] [address]Phone: [phone]  Fax:  [phone] Name: CSigfredo SchreierMRN: [id number] of Birth: 111/03/1981

## 2021-03-14 ENCOUNTER — Other Ambulatory Visit: Payer: Self-pay

## 2021-03-14 ENCOUNTER — Ambulatory Visit: Payer: BC Managed Care – PPO

## 2021-03-14 DIAGNOSIS — R262 Difficulty in walking, not elsewhere classified: Secondary | ICD-10-CM

## 2021-03-14 DIAGNOSIS — R2689 Other abnormalities of gait and mobility: Secondary | ICD-10-CM

## 2021-03-14 DIAGNOSIS — G8929 Other chronic pain: Secondary | ICD-10-CM | POA: Diagnosis not present

## 2021-03-14 DIAGNOSIS — M6281 Muscle weakness (generalized): Secondary | ICD-10-CM

## 2021-03-14 DIAGNOSIS — M5441 Lumbago with sciatica, right side: Secondary | ICD-10-CM | POA: Diagnosis not present

## 2021-03-14 DIAGNOSIS — M5442 Lumbago with sciatica, left side: Secondary | ICD-10-CM | POA: Diagnosis not present

## 2021-03-14 NOTE — Therapy (Signed)
[place], [place], [address] Phone: [phone]   Fax:  [phone]  Physical Therapy Treatment  Patient Details  Name: Daniel Marsh MRN: [id number] Date of Birth: December 20, 1980 Referring Provider (PT): Jamse Arn, MD   Encounter Date: 03/14/2021   PT End of Session - 03/14/21 1701    Visit Number 5    Number of Visits 17    Date for PT Re-Evaluation 04/20/21    Authorization Type [employer] - FOTO visit 6 and visit 10    PT Start Time 1702    PT Stop Time 1741    PT Time Calculation (min) 39 min    Activity Tolerance Patient tolerated treatment well;No increased pain    Behavior During Therapy WFL for tasks assessed/performed           Past Medical History:  Diagnosis Date  . Anxiety   . Arthritis   . Depression   . Hypertension   . Myocardial infarction ([place])   . PTSD (post-traumatic stress disorder)   . Ulcerative colitis ([place]) 2019    Past Surgical History:  Procedure Laterality Date  . ANKLE SURGERY Right 2011  . KNEE SURGERY Left 2020  . SPINE SURGERY  2013   Lumbar fusion L4-S1  . WISDOM TOOTH EXTRACTION      There were no vitals filed for this visit.   Subjective Assessment - 03/14/21 1702    Subjective Pt presents to PT with reports of slightly decreased LBP and some muscle soreness after last treatment session. He has been compliant with his HEP with no adverse effect. Pt is ready to begin PT treatment at this time.    Currently in Pain? Yes    Pain Score 7     Pain Location Back    Pain Orientation Right;Left;Lower    Pain Descriptors / Indicators Aching                             [facility] Adult PT Treatment/Exercise - 03/14/21 0001      Lumbar Exercises: Aerobic   Nustep lvl 6 x 5 min while taking subjective      Lumbar Exercises: Supine   Pelvic Tilt 10 reps;5 seconds    Pelvic Tilt Limitations supine PPT w/ ball squeeze 2x10 - 5 sec hold     Bridge 10 reps   2 sets     Knee/Hip Exercises: Standing   Hip Abduction 2 sets;10 reps;Both   yellow tband   Hip Extension 2 sets;10 reps;Both   yellow tband   Functional Squat 2 sets;10 reps      Knee/Hip Exercises: Seated   Sit to Sand 2 sets;10 reps;without UE support      Knee/Hip Exercises: Supine   Straight Leg Raises 10 reps;Both                    PT Short Term Goals - 02/21/21 1656      PT SHORT TERM GOAL #1   Title Patient will be independent with initial HEP.    Baseline Provided initial HEP only including PPT at eval 02/21/2021 due to time constraints    Time 4    Period Weeks    Status New    Target Date 03/21/21      PT SHORT TERM GOAL #2   Title Patient will improve BLE MMT to >/= 4/5 grossly    Baseline See flowsheet  Time 4    Period Weeks    Status New    Target Date 03/21/21      PT SHORT TERM GOAL #3   Title Patient will report at least 2 consecutive weeks without falls or instances of knees buckling.    Baseline Patient reports frequent knee buckling (bilaterally) that has resulted in several falls    Time 4    Period Weeks    Status New    Target Date 03/21/21      PT SHORT TERM GOAL #4   Title Patient's FOTO score will increase from 32% to >/= 40% functional status to demonstrate improved perceived ability.    Baseline 32% function; predicted 48% function    Time 4    Period Weeks    Status New    Target Date 03/21/21      PT SHORT TERM GOAL #5   Title Patient will ambulate >/= 500 feet indoors on level ground using SPC or LRAD to demonstrate improved ability to perform household and community ambulation.    Baseline Pt able to ambulate short distances (from lobby to therapy gym) but expresses being unable to walk around at the mall or for prolonged distances/time periods    Time 4    Period Weeks    Status New    Target Date 03/21/21             PT Long Term Goals - 02/21/21 1712      PT LONG TERM GOAL #1   Title  Patient will be independent with advanced HEP.    Baseline Provided initial HEP only consisting of PPT at eval 02/21/2021 due to time constraints    Time 8    Period Weeks    Status New    Target Date 04/18/21      PT LONG TERM GOAL #2   Title Patient will improve BLE MMT to >/= 4+/5 grossly for improved functional strength.    Baseline See flowsheet    Time 8    Period Weeks    Status New    Target Date 04/18/21      PT LONG TERM GOAL #3   Title Patient's FOTO score will increase from 32% to >/= 48% functional status to demonstrate improved perceived ability.    Baseline 32% function; predicted 48% function    Time 8    Period Weeks    Status New    Target Date 04/18/21      PT LONG TERM GOAL #4   Title Patient will ambulate >/= 1000 feet indoors on level ground using SPC or LRAD to demonstrate improved ability to perform household and community ambulation.    Baseline Pt able to ambulate short distances (from lobby to therapy gym) but expresses being unable to walk around at the mall or for prolonged distances/time periods    Time 8    Period Weeks    Status New    Target Date 04/18/21      PT LONG TERM GOAL #5   Title Patient will report being able to play with his 37 year old son for at least 30 minutes with </= 5/10 low back pain.    Baseline Pt states pain is typically 7/10. He reports one of his primary goals is to be able to get on the floor and play with his son    Time 8    Period Weeks    Status New    Target Date 04/18/21  Plan - 03/14/21 1741    Clinical Impression Statement Pt was able to complete all prescribed exercises with no adverse effect or increase in pain. PT decreased height of pt's SPC, which seemed to improve gait pattern and slightly decrease pain. He continues to progress strengthening exercises with improving functional activity tolerance. He continues to benefit from skilled PT services and should continue to be seen per POC  as prescribed.    PT Treatment/Interventions ADLs/Self Care Home Management;Aquatic Therapy;Cryotherapy;Electrical Stimulation;Iontophoresis 42m/ml Dexamethasone;Moist Heat;DME Instruction;Neuromuscular re-education;Balance training;Therapeutic exercise;Therapeutic activities;Functional mobility training;Stair training;Gait training;Patient/family education;Manual techniques;Energy conservation;Dry needling;Passive range of motion;Taping    PT Next Visit Plan continue to progress exercises as tolerated; manual therapy if indicated    PT Home Exercise Plan [id number]           Patient will benefit from skilled therapeutic intervention in order to improve the following deficits and impairments:  Abnormal gait,Decreased activity tolerance,Decreased balance,Decreased mobility,Decreased strength,Decreased endurance,Decreased range of motion,Difficulty walking,Decreased knowledge of use of DME,Impaired sensation,Impaired flexibility,Improper body mechanics,Postural dysfunction,Pain,Increased muscle spasms  Visit Diagnosis: Chronic bilateral low back pain with bilateral sciatica  Muscle weakness (generalized)  Difficulty in walking, not elsewhere classified  Other abnormalities of gait and mobility     Problem List Patient Active Problem List   Diagnosis Date Noted  . Hepatic hemangioma 01/17/2021  . Grade III internal hemorrhoids 01/17/2021  . Failed back syndrome 01/03/2021  . Facet arthropathy, lumbar 01/03/2021  . Sleep disturbance 12/06/2020  . Abnormality of gait 12/06/2020  . 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/05/2020  . Hyperlipidemia 06/01/2020  . PTSD (post-traumatic stress disorder)   . Hypertension   . Depression   . Anxiety   . [place]  . Myocardial infarction ([facility] 2015    DWard Chatters PT, DPT 03/14/21 5:45 PM  CS[facility]CR[facility] [place]  [address]Phone: [phone]  Fax:  [phone] Name: Daniel DelimaMRN: [id number] of Birth: 1Jun 24, 1982

## 2021-03-18 ENCOUNTER — Telehealth: Payer: Self-pay

## 2021-03-18 ENCOUNTER — Ambulatory Visit: Payer: BC Managed Care – PPO

## 2021-03-18 ENCOUNTER — Ambulatory Visit (INDEPENDENT_AMBULATORY_CARE_PROVIDER_SITE_OTHER): Payer: BC Managed Care – PPO | Admitting: Psychologist

## 2021-03-18 DIAGNOSIS — F1010 Alcohol abuse, uncomplicated: Secondary | ICD-10-CM | POA: Diagnosis not present

## 2021-03-18 DIAGNOSIS — F331 Major depressive disorder, recurrent, moderate: Secondary | ICD-10-CM | POA: Diagnosis not present

## 2021-03-18 DIAGNOSIS — F4310 Post-traumatic stress disorder, unspecified: Secondary | ICD-10-CM | POA: Diagnosis not present

## 2021-03-18 DIAGNOSIS — Z634 Disappearance and death of family member: Secondary | ICD-10-CM

## 2021-03-18 NOTE — Telephone Encounter (Signed)
PT called and LVM regarding missed appointment, reviewed attendance policy, and left a reminder of next visit.

## 2021-03-19 ENCOUNTER — Encounter: Payer: Self-pay | Admitting: Physical Medicine & Rehabilitation

## 2021-03-19 ENCOUNTER — Other Ambulatory Visit: Payer: Self-pay

## 2021-03-19 ENCOUNTER — Encounter
Payer: BC Managed Care – PPO | Attending: Physical Medicine & Rehabilitation | Admitting: Physical Medicine & Rehabilitation

## 2021-03-19 VITALS — BP 128/76 | Ht 72.0 in | Wt 213.0 lb

## 2021-03-19 DIAGNOSIS — G479 Sleep disorder, unspecified: Secondary | ICD-10-CM | POA: Insufficient documentation

## 2021-03-19 DIAGNOSIS — G8929 Other chronic pain: Secondary | ICD-10-CM | POA: Insufficient documentation

## 2021-03-19 DIAGNOSIS — M47816 Spondylosis without myelopathy or radiculopathy, lumbar region: Secondary | ICD-10-CM | POA: Insufficient documentation

## 2021-03-19 DIAGNOSIS — M961 Postlaminectomy syndrome, not elsewhere classified: Secondary | ICD-10-CM | POA: Insufficient documentation

## 2021-03-19 DIAGNOSIS — M2140 Flat foot [pes planus] (acquired), unspecified foot: Secondary | ICD-10-CM | POA: Insufficient documentation

## 2021-03-19 DIAGNOSIS — M2141 Flat foot [pes planus] (acquired), right foot: Secondary | ICD-10-CM | POA: Insufficient documentation

## 2021-03-19 DIAGNOSIS — M5441 Lumbago with sciatica, right side: Secondary | ICD-10-CM | POA: Insufficient documentation

## 2021-03-19 DIAGNOSIS — R269 Unspecified abnormalities of gait and mobility: Secondary | ICD-10-CM | POA: Diagnosis not present

## 2021-03-19 DIAGNOSIS — M2142 Flat foot [pes planus] (acquired), left foot: Secondary | ICD-10-CM | POA: Insufficient documentation

## 2021-03-19 MED ORDER — METHYLPREDNISOLONE 4 MG PO TBPK: ORAL_TABLET | ORAL | 0 refills | Status: DC

## 2021-03-19 MED ORDER — GABAPENTIN 600 MG PO TABS: 1200.0000 mg | ORAL_TABLET | Freq: Three times a day (TID) | ORAL | 1 refills | Status: DC

## 2021-03-19 MED ORDER — AMITRIPTYLINE HCL 25 MG PO TABS: 25.0000 mg | ORAL_TABLET | Freq: Every day | ORAL | 1 refills | Status: DC

## 2021-03-19 NOTE — Progress Notes (Signed)
Subjective:    Patient ID: Daniel Marsh, male    DOB: 1981-04-23, 40 y.o.   MRN: [id number]   TELEHEALTH NOTE  Due to national recommendations of social distancing due to COVID 19, an audio/video telehealth visit is felt to be most appropriate for this patient at this time. See Chart message from today for the patient's consent to telehealth from [place].   I verified that I am speaking with the correct person using two identifiers.  Location of patient: Home Location of provider: Office Method of communication: MyChart video Names of participants : Zorita Pang scheduling, Sharia Reeve obtaining consent and vitals if available Established patient  HPI Patient with pmh/psh of UC, PTSD, MI, HTN, OA, anxiety/depression, right ankle surgery, L4-S1 lumbar fusion presents with low back pain.  Initially stated:  Back pain started in 2012 after he was moving an heavy object fracturing bones. He had fusion afterward. Since that time pain has been getting worse. TENS unit improves the pain. Located central lower back. Denies alleviating factors. Cannot identify exacerbating factors.  All qualities of pain - pin/needles worst.  Radiates to right toes an left knee.  Associated LLE numbness.  Has associated bowel/bladder incontinence. Denies falls. He moved to [place] from [facility]. He saw Sports Med and had steroid injection in his left knee and told her has left ACL and right meniscal tear.  He would like to be more active. Last fall 03/2020 with legs "giving out".  Pain limits activity.   Last clinic visit on 01/03/21.  Video visit due to flare in pain. Since that time, pt states he has significant increase in pin/needles in his left leg x2 days. He notes over the weekend he had significant increase in activity, with walking up bleachers for a baseball game, attending a wedding with significant walking on a ranch, and attending a easter egg  hunt with his son, bending and walking. He states he is doing exercise with benefit. His activity has significantly increased.  He notes improvement with arch support as well.  He states he had 1 fall when his knees buckled. He notes mild benefit with increase in Elavil, but it does cause some gait changes in the AM at times.   Pain Inventory Average Pain 10 Pain Right Now 10 My pain is constant, sharp, burning, dull, stabbing, tingling and aching  In the last 24 hours, has pain interfered with the following? General activity 10 Relation with others 10 Enjoyment of life 10 What TIME of day is your pain at its worst? morning , daytime, evening, night and varies Sleep (in general) Poor  Pain is worse with: walking, bending, sitting, standing and some activites Pain improves with: rest, heat/ice, therapy/exercise, medication, TENS and injections Relief from Meds: 0     Family History  Problem Relation Age of Onset  . Osteoarthritis Mother   . Depression Mother   . Heart attack Mother   . Osteoarthritis Father   . Depression Father   . Depression Brother   . Diabetes Maternal Grandmother   . Hyperlipidemia Maternal Grandmother   . Hypertension Maternal Grandmother   . Heart attack Maternal Grandmother   . Osteoarthritis Maternal Grandfather   . COPD Maternal Grandfather   . Kidney disease Maternal Grandfather   . Renal cancer Maternal Grandfather   . Alcohol abuse Paternal Grandmother   . COPD Paternal Grandmother   . Diabetes Paternal Grandmother   . Hypertension Paternal Grandmother   .  Lung cancer Paternal Grandmother   . Osteoarthritis Paternal Grandfather   . Heart attack Paternal Grandfather   . Heart failure Paternal Grandfather   . Depression Brother   . Pancreatic cancer Maternal Aunt   . Lung cancer Paternal Aunt   . Colon cancer Maternal Uncle    Social History   Socioeconomic History  . Marital status: Married    Spouse name: Not on file  . Number of  children: 3  . Years of education: Not on file  . Highest education level: Not on file  Occupational History  . Not on file  Tobacco Use  . Smoking status: Current Some Day Smoker    Types: Cigarettes, Cigars  . Smokeless tobacco: Current User    Types: Chew  Vaping Use  . Vaping Use: Never used  Substance and Sexual Activity  . Alcohol use: Yes    Alcohol/week: 13.0 standard drinks    Types: 10 Cans of beer, 3 Shots of liquor per week  . Drug use: Yes    Types: Marijuana    Comment: occ  . Sexual activity: Yes  Other Topics Concern  . Not on file  Social History Narrative   Was in the [employer] for 7 years, required lumbar surgery while in the [employer] and had poor outcome   Divorced and re-married   54 yo son   Social Determinants of Radio broadcast assistant Strain: Not on file  Food Insecurity: Not on file  Transportation Needs: Not on file  Physical Activity: Not on file  Stress: Not on file  Social Connections: Not on file   Past Surgical History:  Procedure Laterality Date  . ANKLE SURGERY Right 2011  . KNEE SURGERY Left 2020  . SPINE SURGERY  2013   Lumbar fusion L4-S1  . WISDOM TOOTH EXTRACTION     Past Medical History:  Diagnosis Date  . Anxiety   . Arthritis   . Depression   . Hypertension   . Myocardial infarction ([place])   . PTSD (post-traumatic stress disorder)   . Ulcerative colitis ([place]) 2019   BP 128/76 Comment: couple of days ago  Ht 6' (1.829 m)   Wt 213 lb (96.6 kg)   BMI 28.89 kg/m   Opioid Risk Score:   Fall Risk Score:  `1  Depression screen PHQ 2/9  Depression screen [facility] 2/9 03/19/2021 11/05/2020 10/08/2020 05/30/2020  Decreased Interest 1 3 3 2   Down, Depressed, Hopeless 1 3 3 3   PHQ - 2 Score 2 6 6 5   Altered sleeping 1 3 1 3   Tired, decreased energy - 3 3 2   Change in appetite - 3 3 3   Feeling bad or failure about yourself  - 3 3 2   Trouble concentrating - 3 3 2   Moving slowly or fidgety/restless - 2 2 2   Suicidal  thoughts - 1 1 1   PHQ-9 Score 3 24 22 20   Difficult doing work/chores - Very difficult Very difficult Somewhat difficult   Review of Systems  Constitutional:       Night sweats  Gastrointestinal:       Incontinence  Genitourinary:       Incontinence  Musculoskeletal: Positive for arthralgias, back pain, gait problem, myalgias and neck pain.       Wrist pain Knee pain Ankle pain Lower back pain  Neurological: Positive for dizziness, tremors, weakness, numbness and headaches.  Psychiatric/Behavioral: Positive for sleep disturbance.  All other systems reviewed and are negative.  Objective:   Physical Exam  Constitutional: NAD. Vital signs reviewed. HENT: Normocephalic.  Atraumatic. Eyes: EOMI. No discharge. Respiratory: Normal effort.  No stridor.   Psych: Normal mood.  Normal behavior. Neuro: Alert    Assessment & Plan:  Patient with pmh/psh of UC, PTSD, MI, HTN, OA, anxiety/depression, right ankle surgery, L4-S1 lumbar fusion presents with low back pain. Back pain started in 2012 after he was moving an heavy object fracturing bones.   1. Chronic mechanical low back pain - multifactorial: Failed back syndrome, spondylosis, facet arthropathy  MRI L-spine 10/2020 showing 1. Prior ALIF at L4-5 with residual mild narrowing of the lateral recesses without neural impingement. 2. Mild disc bulge with small central disc protrusion at L3-4 with resultant mild lateral recess stenosis without neural impingement. 3. Mild bilateral L3 and L4 foraminal narrowing related to mild degenerative spondylosis and facet hypertrophy.  Will try to avoid NSAID due to UC  Temporary benefit with Lidocaine patch, TENS unit  No benefit with heat/cold  Continue PT focusing on core strengthening and hypermobility  Continue Gabapentin 123m TID, recent labs reviewed, will continue to monitor  Will consider Cymbalta 363mdaily with food, pt currently on Trintellix  Will consider Robaxin   Will  consider referral to Psychology  Will consider accupuncture  Will consider PNS, if platues, currently improving for the most part  Patient states main goal is to be more active  Continue arch support for b/l Pes planus  Medrol dose pack ordered  2. Gait abnormality  Continue cane for safety  3. Sleep disturbance - due to pain +PTSD + depression  See #1  Continue Elavil 25qhs- will not increase dose due to mild intermittent side effects, patient would like to continue current dose, educated on signs/symptoms of serotonin syndrome again  Gradually improving  4. Myalgia   Will consider trigger point injections

## 2021-03-21 ENCOUNTER — Ambulatory Visit: Payer: BC Managed Care – PPO

## 2021-03-21 ENCOUNTER — Other Ambulatory Visit: Payer: Self-pay

## 2021-03-21 DIAGNOSIS — M5442 Lumbago with sciatica, left side: Secondary | ICD-10-CM | POA: Diagnosis not present

## 2021-03-21 DIAGNOSIS — M6281 Muscle weakness (generalized): Secondary | ICD-10-CM | POA: Diagnosis not present

## 2021-03-21 DIAGNOSIS — M5441 Lumbago with sciatica, right side: Secondary | ICD-10-CM | POA: Diagnosis not present

## 2021-03-21 DIAGNOSIS — R2689 Other abnormalities of gait and mobility: Secondary | ICD-10-CM

## 2021-03-21 DIAGNOSIS — R262 Difficulty in walking, not elsewhere classified: Secondary | ICD-10-CM | POA: Diagnosis not present

## 2021-03-21 DIAGNOSIS — G8929 Other chronic pain: Secondary | ICD-10-CM

## 2021-03-21 NOTE — Therapy (Signed)
[place], [place], [address] Phone: [phone]   Fax:  [phone]  Physical Therapy Treatment  Patient Details  Name: Daniel Marsh MRN: [id number] Date of Birth: [birth year] Referring Provider (PT): Jamse Arn, MD   Encounter Date: 03/21/2021   PT End of Session - 03/21/21 1458    Visit Number 6    Number of Visits 17    Date for PT Re-Evaluation 04/20/21    Authorization Type BCBS - FOTO visit 6 and visit 10    PT Start Time 1456    PT Stop Time 1529    PT Time Calculation (min) 33 min    Activity Tolerance Patient limited by pain    Behavior During Therapy [facility] for tasks assessed/performed           Past Medical History:  Diagnosis Date  . Anxiety   . Arthritis   . Depression   . Hypertension   . Myocardial infarction ([place])   . PTSD (post-traumatic stress disorder)   . Ulcerative colitis ([place]) 2019    Past Surgical History:  Procedure Laterality Date  . ANKLE SURGERY Right 2011  . KNEE SURGERY Left 2020  . SPINE SURGERY  2013   Lumbar fusion L4-S1  . WISDOM TOOTH EXTRACTION      There were no vitals filed for this visit.   Subjective Assessment - 03/21/21 1459    Subjective Pt presents to PT with reports of increased lower back and sciatica type pain down L LE. He notes that this may have been due to an active easter weekend. He has just started a steroid pack to alleviate discomfort. Pt is ready to begin PT treatment at this time.    Pain Score 10-Worst pain ever    Pain Location Back    Pain Orientation Lower    Pain Radiating Towards posterior L LE                             [facility] Adult PT Treatment/Exercise - 03/21/21 0001      Lumbar Exercises: Supine   Pelvic Tilt 2 reps;10 reps;5 seconds    Other Supine Lumbar Exercises supine hip abd/add isometric 2x10 - 3 sec hold    Other Supine Lumbar Exercises supine sciatic nerve glide w/ LE  straight x 15 ea      Knee/Hip Exercises: Supine   Other Supine Knee/Hip Exercises hamstring ball rollouts 2x10 w/ swiss ball      Modalities   Modalities Moist Heat      Moist Heat Therapy   Number Minutes Moist Heat --   provided throughout supine exercises   Moist Heat Location Lumbar Spine                    PT Short Term Goals - 02/21/21 1656      PT SHORT TERM GOAL #1   Title Patient will be independent with initial HEP.    Baseline Provided initial HEP only including PPT at eval 02/21/2021 due to time constraints    Time 4    Period Weeks    Status New    Target Date 03/21/21      PT SHORT TERM GOAL #2   Title Patient will improve BLE MMT to >/= 4/5 grossly    Baseline See flowsheet    Time 4    Period Weeks    Status New  Target Date 03/21/21      PT SHORT TERM GOAL #3   Title Patient will report at least 2 consecutive weeks without falls or instances of knees buckling.    Baseline Patient reports frequent knee buckling (bilaterally) that has resulted in several falls    Time 4    Period Weeks    Status New    Target Date 03/21/21      PT SHORT TERM GOAL #4   Title Patient's FOTO score will increase from 32% to >/= 40% functional status to demonstrate improved perceived ability.    Baseline 32% function; predicted 48% function    Time 4    Period Weeks    Status New    Target Date 03/21/21      PT SHORT TERM GOAL #5   Title Patient will ambulate >/= 500 feet indoors on level ground using SPC or LRAD to demonstrate improved ability to perform household and community ambulation.    Baseline Pt able to ambulate short distances (from lobby to therapy gym) but expresses being unable to walk around at the mall or for prolonged distances/time periods    Time 4    Period Weeks    Status New    Target Date 03/21/21             PT Long Term Goals - 02/21/21 1712      PT LONG TERM GOAL #1   Title Patient will be independent with advanced HEP.     Baseline Provided initial HEP only consisting of PPT at eval 02/21/2021 due to time constraints    Time 8    Period Weeks    Status New    Target Date 04/18/21      PT LONG TERM GOAL #2   Title Patient will improve BLE MMT to >/= 4+/5 grossly for improved functional strength.    Baseline See flowsheet    Time 8    Period Weeks    Status New    Target Date 04/18/21      PT LONG TERM GOAL #3   Title Patient's FOTO score will increase from 32% to >/= 48% functional status to demonstrate improved perceived ability.    Baseline 32% function; predicted 48% function    Time 8    Period Weeks    Status New    Target Date 04/18/21      PT LONG TERM GOAL #4   Title Patient will ambulate >/= 1000 feet indoors on level ground using SPC or LRAD to demonstrate improved ability to perform household and community ambulation.    Baseline Pt able to ambulate short distances (from lobby to therapy gym) but expresses being unable to walk around at the mall or for prolonged distances/time periods    Time 8    Period Weeks    Status New    Target Date 04/18/21      PT LONG TERM GOAL #5   Title Patient will report being able to play with his 27 year old son for at least 30 minutes with </= 5/10 low back pain.    Baseline Pt states pain is typically 7/10. He reports one of his primary goals is to be able to get on the floor and play with his son    Time 8    Period Weeks    Status New    Target Date 04/18/21  Plan - 03/21/21 1743    Clinical Impression Statement Pt tolerated treatment fair but was limited by severe lower back and posterior L LE pain. He reported slight improvement in comfort while performing supine exercises on MHP. Pt has not been able to progress very well in the last few weeks, with increased lower back and LE pain. PT will continue to try and progress pt as tolerated. Will consider other modalities if pain continues to stay very high.    PT  Treatment/Interventions ADLs/Self Care Home Management;Aquatic Therapy;Cryotherapy;Electrical Stimulation;Iontophoresis 12m/ml Dexamethasone;Moist Heat;DME Instruction;Neuromuscular re-education;Balance training;Therapeutic exercise;Therapeutic activities;Functional mobility training;Stair training;Gait training;Patient/family education;Manual techniques;Energy conservation;Dry needling;Passive range of motion;Taping    PT Next Visit Plan will consider modalities if severe pain continues; continue to progress core and hip strength as able    PT Home Exercise Plan [email or web address]          Patient will benefit from skilled therapeutic intervention in order to improve the following deficits and impairments:  Abnormal gait,Decreased activity tolerance,Decreased balance,Decreased mobility,Decreased strength,Decreased endurance,Decreased range of motion,Difficulty walking,Decreased knowledge of use of DME,Impaired sensation,Impaired flexibility,Improper body mechanics,Postural dysfunction,Pain,Increased muscle spasms  Visit Diagnosis: Chronic bilateral low back pain with bilateral sciatica  Muscle weakness (generalized)  Difficulty in walking, not elsewhere classified  Other abnormalities of gait and mobility     Problem List Patient Active Problem List   Diagnosis Date Noted  . Flat foot 03/19/2021  . Hepatic hemangioma 01/17/2021  . Grade III internal hemorrhoids 01/17/2021  . Failed back syndrome 01/03/2021  . Facet arthropathy, lumbar 01/03/2021  . Sleep disturbance 12/06/2020  . Abnormality of gait 12/06/2020  . 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/05/2020  . Hyperlipidemia 06/01/2020  . PTSD (post-traumatic stress disorder)   . Hypertension   . Depression   . Anxiety   . Ulcerative colitis ([place] 2019  . Myocardial infarction ([facility] 2015    DWard Chatters PT, DPT 03/21/21 5:57 PM  [facility]G[place] [address]Phone: [phone]  Fax:  [phone] Name: Daniel MilamMRN: [id number] of Birth: [birth year]

## 2021-03-25 ENCOUNTER — Ambulatory Visit: Payer: BC Managed Care – PPO

## 2021-03-26 ENCOUNTER — Telehealth: Payer: Self-pay

## 2021-03-26 NOTE — Telephone Encounter (Signed)
Papers came in from [place] office for Disability requesting Medical Records for pt. The request was sent to Medical Records to handle this

## 2021-03-28 ENCOUNTER — Ambulatory Visit: Payer: BC Managed Care – PPO

## 2021-03-28 ENCOUNTER — Other Ambulatory Visit: Payer: Self-pay

## 2021-03-28 DIAGNOSIS — R2689 Other abnormalities of gait and mobility: Secondary | ICD-10-CM

## 2021-03-28 DIAGNOSIS — G8929 Other chronic pain: Secondary | ICD-10-CM

## 2021-03-28 DIAGNOSIS — M6281 Muscle weakness (generalized): Secondary | ICD-10-CM | POA: Diagnosis not present

## 2021-03-28 DIAGNOSIS — R262 Difficulty in walking, not elsewhere classified: Secondary | ICD-10-CM

## 2021-03-28 DIAGNOSIS — M5442 Lumbago with sciatica, left side: Secondary | ICD-10-CM | POA: Diagnosis not present

## 2021-03-28 DIAGNOSIS — M5441 Lumbago with sciatica, right side: Secondary | ICD-10-CM | POA: Diagnosis not present

## 2021-03-28 NOTE — Therapy (Signed)
[place], [place], [address] Phone: [phone]   Fax:  [phone]  Physical Therapy Treatment  Patient Details  Name: Daniel Marsh MRN: [id number] Date of Birth: 02-15-1981 Referring Provider (PT): Jamse Arn, MD   Encounter Date: 03/28/2021   PT End of Session - 03/28/21 1456    Visit Number 7    Number of Visits 17    Date for PT Re-Evaluation 04/20/21    Authorization Type BCBS - FOTO visit 6 and visit 10    PT Start Time 1455    PT Stop Time 1530    PT Time Calculation (min) 35 min    Activity Tolerance Patient tolerated treatment well;No increased pain    Behavior During Therapy WFL for tasks assessed/performed           Past Medical History:  Diagnosis Date  . Anxiety   . Arthritis   . Depression   . Hypertension   . Myocardial infarction ([place])   . PTSD (post-traumatic stress disorder)   . Ulcerative colitis ([place]) 2019    Past Surgical History:  Procedure Laterality Date  . ANKLE SURGERY Right 2011  . KNEE SURGERY Left 2020  . SPINE SURGERY  2013   Lumbar fusion L4-S1  . WISDOM TOOTH EXTRACTION      There were no vitals filed for this visit.   Subjective Assessment - 03/28/21 1456    Subjective Pt presents to PT with reports of decreased pain since last session. He notes decrease in pain most likely due to steroid pack. Pt is ready to begin PT treamtent at this time.    Currently in Pain? Yes    Pain Score 7     Pain Location Back    Pain Orientation Lower                             [facility] Adult PT Treatment/Exercise - 03/28/21 0001      Lumbar Exercises: Seated   Other Seated Lumbar Exercises seated sciatic nerve glide 2 x 15 ea      Lumbar Exercises: Supine   Pelvic Tilt 10 reps;5 seconds    Straight Leg Raise 10 reps    Isometric Hip Flexion Limitations 90/90 hold 3x15 sec    Other Supine Lumbar Exercises supine PPT 2x10 - 5 sec  hold    Other Supine Lumbar Exercises supine PPTw/ clamshell 2x10 - blue tband      Knee/Hip Exercises: Standing   Hip Extension 2 sets;10 reps    Extension Limitations yellow tband    Other Standing Knee Exercises lateral walk yellow tband x 3 laps      Knee/Hip Exercises: Supine   Other Supine Knee/Hip Exercises hamstring ball rollouts x 15 w/ swiss ball                    PT Short Term Goals - 02/21/21 1656      PT SHORT TERM GOAL #1   Title Patient will be independent with initial HEP.    Baseline Provided initial HEP only including PPT at eval 02/21/2021 due to time constraints    Time 4    Period Weeks    Status New    Target Date 03/21/21      PT SHORT TERM GOAL #2   Title Patient will improve BLE MMT to >/= 4/5 grossly    Baseline See flowsheet  Time 4    Period Weeks    Status New    Target Date 03/21/21      PT SHORT TERM GOAL #3   Title Patient will report at least 2 consecutive weeks without falls or instances of knees buckling.    Baseline Patient reports frequent knee buckling (bilaterally) that has resulted in several falls    Time 4    Period Weeks    Status New    Target Date 03/21/21      PT SHORT TERM GOAL #4   Title Patient's FOTO score will increase from 32% to >/= 40% functional status to demonstrate improved perceived ability.    Baseline 32% function; predicted 48% function    Time 4    Period Weeks    Status New    Target Date 03/21/21      PT SHORT TERM GOAL #5   Title Patient will ambulate >/= 500 feet indoors on level ground using SPC or LRAD to demonstrate improved ability to perform household and community ambulation.    Baseline Pt able to ambulate short distances (from lobby to therapy gym) but expresses being unable to walk around at the mall or for prolonged distances/time periods    Time 4    Period Weeks    Status New    Target Date 03/21/21             PT Long Term Goals - 02/21/21 1712      PT LONG TERM  GOAL #1   Title Patient will be independent with advanced HEP.    Baseline Provided initial HEP only consisting of PPT at eval 02/21/2021 due to time constraints    Time 8    Period Weeks    Status New    Target Date 04/18/21      PT LONG TERM GOAL #2   Title Patient will improve BLE MMT to >/= 4+/5 grossly for improved functional strength.    Baseline See flowsheet    Time 8    Period Weeks    Status New    Target Date 04/18/21      PT LONG TERM GOAL #3   Title Patient's FOTO score will increase from 32% to >/= 48% functional status to demonstrate improved perceived ability.    Baseline 32% function; predicted 48% function    Time 8    Period Weeks    Status New    Target Date 04/18/21      PT LONG TERM GOAL #4   Title Patient will ambulate >/= 1000 feet indoors on level ground using SPC or LRAD to demonstrate improved ability to perform household and community ambulation.    Baseline Pt able to ambulate short distances (from lobby to therapy gym) but expresses being unable to walk around at the mall or for prolonged distances/time periods    Time 8    Period Weeks    Status New    Target Date 04/18/21      PT LONG TERM GOAL #5   Title Patient will report being able to play with his 36 year old son for at least 30 minutes with </= 5/10 low back pain.    Baseline Pt states pain is typically 7/10. He reports one of his primary goals is to be able to get on the floor and play with his son    Time 8    Period Weeks    Status New    Target Date 04/18/21  Plan - 03/28/21 1700    Clinical Impression Statement Pt tolerated treatment better today with no increase in pain or adverse effect. He was able to perform standing strengthening today with improved tolerance. Pt continues to progress with therapy, but has trouble with intermittent increases in pain that limit progression. PT will continue to treat and progress exercises as able per POC as prescribed.     PT Treatment/Interventions ADLs/Self Care Home Management;Aquatic Therapy;Cryotherapy;Electrical Stimulation;Iontophoresis 81m/ml Dexamethasone;Moist Heat;DME Instruction;Neuromuscular re-education;Balance training;Therapeutic exercise;Therapeutic activities;Functional mobility training;Stair training;Gait training;Patient/family education;Manual techniques;Energy conservation;Dry needling;Passive range of motion;Taping    PT Next Visit Plan continue to progress core and hip strength as able    PT Home Exercise Plan [id number]          Patient will benefit from skilled therapeutic intervention in order to improve the following deficits and impairments:  Abnormal gait,Decreased activity tolerance,Decreased balance,Decreased mobility,Decreased strength,Decreased endurance,Decreased range of motion,Difficulty walking,Decreased knowledge of use of DME,Impaired sensation,Impaired flexibility,Improper body mechanics,Postural dysfunction,Pain,Increased muscle spasms  Visit Diagnosis: Chronic bilateral low back pain with bilateral sciatica  Muscle weakness (generalized)  Difficulty in walking, not elsewhere classified  Other abnormalities of gait and mobility     Problem List Patient Active Problem List   Diagnosis Date Noted  . Flat foot 03/19/2021  . Hepatic hemangioma 01/17/2021  . Grade III internal hemorrhoids 01/17/2021  . Failed back syndrome 01/03/2021  . Facet arthropathy, lumbar 01/03/2021  . Sleep disturbance 12/06/2020  . Abnormality of gait 12/06/2020  . 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/05/2020  . Hyperlipidemia 06/01/2020  . PTSD (post-traumatic stress disorder)   . Hypertension   . Depression   . Anxiety   . Ulcerative colitis ([place] 2019  . Myocardial infarction ([facility] 2015    DWard Chatters PT, DPT 03/28/21 5:02 PM  [facility] [address]Phone: [phone]   Fax:  [phone] Name: CLance HuarachaMRN: [id number] of Birth: 110-Jul-1982

## 2021-04-01 ENCOUNTER — Other Ambulatory Visit: Payer: Self-pay

## 2021-04-01 ENCOUNTER — Ambulatory Visit: Payer: BC Managed Care – PPO | Attending: Physical Medicine & Rehabilitation

## 2021-04-01 DIAGNOSIS — R2689 Other abnormalities of gait and mobility: Secondary | ICD-10-CM | POA: Insufficient documentation

## 2021-04-01 DIAGNOSIS — M5442 Lumbago with sciatica, left side: Secondary | ICD-10-CM | POA: Diagnosis not present

## 2021-04-01 DIAGNOSIS — R262 Difficulty in walking, not elsewhere classified: Secondary | ICD-10-CM | POA: Insufficient documentation

## 2021-04-01 DIAGNOSIS — G8929 Other chronic pain: Secondary | ICD-10-CM | POA: Insufficient documentation

## 2021-04-01 DIAGNOSIS — M5441 Lumbago with sciatica, right side: Secondary | ICD-10-CM | POA: Insufficient documentation

## 2021-04-01 DIAGNOSIS — M6281 Muscle weakness (generalized): Secondary | ICD-10-CM | POA: Insufficient documentation

## 2021-04-01 NOTE — Therapy (Signed)
[place], [place], [address] Phone: [phone]   Fax:  [phone]  Physical Therapy Treatment  Patient Details  Name: Daniel Marsh MRN: [id number] Date of Birth: 28-Feb-1981 Referring Provider (PT): Jamse Arn, MD   Encounter Date: 04/01/2021   PT End of Session - 04/01/21 1456    Visit Number 8    Number of Visits 17    Date for PT Re-Evaluation 04/20/21    Authorization Type BC[employer] - FOTO visit 6 and visit 10    PT Start Time 6301   arrived late   PT Stop Time 1528    PT Time Calculation (min) 33 min    Activity Tolerance Patient tolerated treatment well;No increased pain    Behavior During Therapy WFL for tasks assessed/performed           Past Medical History:  Diagnosis Date  . Anxiety   . Arthritis   . Depression   . Hypertension   . Myocardial infarction ([place])   . PTSD (post-traumatic stress disorder)   . Ulcerative colitis ([place]) 2019    Past Surgical History:  Procedure Laterality Date  . ANKLE SURGERY Right 2011  . KNEE SURGERY Left 2020  . SPINE SURGERY  2013   Lumbar fusion L4-S1  . WISDOM TOOTH EXTRACTION      There were no vitals filed for this visit.   Subjective Assessment - 04/01/21 1457    Subjective Pt presents to PT with reports of continued lower back pain. He notes that he had increased pain the previous day, when he was helping dig up bricks outside for a patio.    Currently in Pain? Yes    Pain Score 9     Pain Location Back    Pain Orientation Lower                             [facility] Adult PT Treatment/Exercise - 04/01/21 0001      Lumbar Exercises: Supine   Pelvic Tilt 10 reps;5 seconds    Pelvic Tilt Limitations supine PPT w/ ball squeeze 2x10 - 5 sec hold    Straight Leg Raise 10 reps    Isometric Hip Flexion Limitations 90/90 hold 3x15 sec    Other Supine Lumbar Exercises supine march 2x10      Knee/Hip Exercises:  Standing   Hip Abduction 2 sets;10 reps    Abduction Limitations red tband    Hip Extension 2 sets;10 reps;Both    Extension Limitations red tband    Other Standing Knee Exercises lateral walk red tband 4 laps in //      Knee/Hip Exercises: Supine   Bridges 2 sets;10 reps      Knee/Hip Exercises: Sidelying   Clams 2x10 green tband                    PT Short Term Goals - 02/21/21 1656      PT SHORT TERM GOAL #1   Title Patient will be independent with initial HEP.    Baseline Provided initial HEP only including PPT at eval 02/21/2021 due to time constraints    Time 4    Period Weeks    Status New    Target Date 03/21/21      PT SHORT TERM GOAL #2   Title Patient will improve BLE MMT to >/= 4/5 grossly    Baseline See flowsheet  Time 4    Period Weeks    Status New    Target Date 03/21/21      PT SHORT TERM GOAL #3   Title Patient will report at least 2 consecutive weeks without falls or instances of knees buckling.    Baseline Patient reports frequent knee buckling (bilaterally) that has resulted in several falls    Time 4    Period Weeks    Status New    Target Date 03/21/21      PT SHORT TERM GOAL #4   Title Patient's FOTO score will increase from 32% to >/= 40% functional status to demonstrate improved perceived ability.    Baseline 32% function; predicted 48% function    Time 4    Period Weeks    Status New    Target Date 03/21/21      PT SHORT TERM GOAL #5   Title Patient will ambulate >/= 500 feet indoors on level ground using SPC or LRAD to demonstrate improved ability to perform household and community ambulation.    Baseline Pt able to ambulate short distances (from lobby to therapy gym) but expresses being unable to walk around at the mall or for prolonged distances/time periods    Time 4    Period Weeks    Status New    Target Date 03/21/21             PT Long Term Goals - 02/21/21 1712      PT LONG TERM GOAL #1   Title Patient  will be independent with advanced HEP.    Baseline Provided initial HEP only consisting of PPT at eval 02/21/2021 due to time constraints    Time 8    Period Weeks    Status New    Target Date 04/18/21      PT LONG TERM GOAL #2   Title Patient will improve BLE MMT to >/= 4+/5 grossly for improved functional strength.    Baseline See flowsheet    Time 8    Period Weeks    Status New    Target Date 04/18/21      PT LONG TERM GOAL #3   Title Patient's FOTO score will increase from 32% to >/= 48% functional status to demonstrate improved perceived ability.    Baseline 32% function; predicted 48% function    Time 8    Period Weeks    Status New    Target Date 04/18/21      PT LONG TERM GOAL #4   Title Patient will ambulate >/= 1000 feet indoors on level ground using SPC or LRAD to demonstrate improved ability to perform household and community ambulation.    Baseline Pt able to ambulate short distances (from lobby to therapy gym) but expresses being unable to walk around at the mall or for prolonged distances/time periods    Time 8    Period Weeks    Status New    Target Date 04/18/21      PT LONG TERM GOAL #5   Title Patient will report being able to play with his 23 year old son for at least 30 minutes with </= 5/10 low back pain.    Baseline Pt states pain is typically 7/10. He reports one of his primary goals is to be able to get on the floor and play with his son    Time 8    Period Weeks    Status New    Target Date 04/18/21  Plan - 04/01/21 1526    Clinical Impression Statement Pt was able to complete all prescribed exercises but did have continued discomfort in lower back. He was able to perform higher level/intensity exercise today and in general is showing improved functional activity tolerance. PT will continue to progress exercises as tolerated per POC as prescribed.    PT Treatment/Interventions ADLs/Self Care Home Management;Aquatic  Therapy;Cryotherapy;Electrical Stimulation;Iontophoresis 68m/ml Dexamethasone;Moist Heat;DME Instruction;Neuromuscular re-education;Balance training;Therapeutic exercise;Therapeutic activities;Functional mobility training;Stair training;Gait training;Patient/family education;Manual techniques;Energy conservation;Dry needling;Passive range of motion;Taping    PT Next Visit Plan continue to progress core and hip strength as able    PT Home Exercise Plan [email or web address]          Patient will benefit from skilled therapeutic intervention in order to improve the following deficits and impairments:  Abnormal gait,Decreased activity tolerance,Decreased balance,Decreased mobility,Decreased strength,Decreased endurance,Decreased range of motion,Difficulty walking,Decreased knowledge of use of DME,Impaired sensation,Impaired flexibility,Improper body mechanics,Postural dysfunction,Pain,Increased muscle spasms  Visit Diagnosis: Chronic bilateral low back pain with bilateral sciatica  Muscle weakness (generalized)  Difficulty in walking, not elsewhere classified  Other abnormalities of gait and mobility     Problem List Patient Active Problem List   Diagnosis Date Noted  . Flat foot 03/19/2021  . Hepatic hemangioma 01/17/2021  . Grade III internal hemorrhoids 01/17/2021  . Failed back syndrome 01/03/2021  . Facet arthropathy, lumbar 01/03/2021  . Sleep disturbance 12/06/2020  . Abnormality of gait 12/06/2020  . 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/05/2020  . Hyperlipidemia 06/01/2020  . PTSD (post-traumatic stress disorder)   . Hypertension   . Depression   . Anxiety   . [place]cerative [place] ([place] 2019  . Myocardial infarction ([facility] 2015    DWard Chatters PT, DPT 04/01/21 3:30 PM  [facility] [address]Phone: [phone]  Fax:  [phone] Name: CKyree AdrianoMRN:  [id number] of Birth: 115-Nov-1982

## 2021-04-02 ENCOUNTER — Ambulatory Visit (INDEPENDENT_AMBULATORY_CARE_PROVIDER_SITE_OTHER): Payer: BC Managed Care – PPO | Admitting: Psychologist

## 2021-04-02 DIAGNOSIS — Z634 Disappearance and death of family member: Secondary | ICD-10-CM

## 2021-04-02 DIAGNOSIS — F1010 Alcohol abuse, uncomplicated: Secondary | ICD-10-CM

## 2021-04-04 ENCOUNTER — Ambulatory Visit: Payer: BC Managed Care – PPO

## 2021-04-04 ENCOUNTER — Other Ambulatory Visit: Payer: Self-pay

## 2021-04-04 DIAGNOSIS — M5442 Lumbago with sciatica, left side: Secondary | ICD-10-CM | POA: Diagnosis not present

## 2021-04-04 DIAGNOSIS — M5441 Lumbago with sciatica, right side: Secondary | ICD-10-CM | POA: Diagnosis not present

## 2021-04-04 DIAGNOSIS — M6281 Muscle weakness (generalized): Secondary | ICD-10-CM

## 2021-04-04 DIAGNOSIS — R2689 Other abnormalities of gait and mobility: Secondary | ICD-10-CM | POA: Diagnosis not present

## 2021-04-04 DIAGNOSIS — G8929 Other chronic pain: Secondary | ICD-10-CM | POA: Diagnosis not present

## 2021-04-04 DIAGNOSIS — R262 Difficulty in walking, not elsewhere classified: Secondary | ICD-10-CM

## 2021-04-04 NOTE — Therapy (Signed)
[place], [place], [address] Phone: [phone]   Fax:  [phone]  Physical Therapy Treatment  Patient Details  Name: Daniel Marsh MRN: [id number] Date of Birth: Aug 02, 1981 Referring Provider (PT): Jamse Arn, MD   Encounter Date: 04/04/2021   PT End of Session - 04/04/21 1402    Visit Number 9    Number of Visits 17    Date for PT Re-Evaluation 04/20/21    Authorization Type BCBS - FOTO visit 6 and visit 10    PT Start Time 1400    PT Stop Time 1441    PT Time Calculation (min) 41 min    Activity Tolerance Patient tolerated treatment well;No increased pain    Behavior During Therapy WFL for tasks assessed/performed           Past Medical History:  Diagnosis Date  . Anxiety   . Arthritis   . Depression   . Hypertension   . Myocardial infarction ([place])   . PTSD (post-traumatic stress disorder)   . Ulcerative colitis ([place]) 2019    Past Surgical History:  Procedure Laterality Date  . ANKLE SURGERY Right 2011  . KNEE SURGERY Left 2020  . SPINE SURGERY  2013   Lumbar fusion L4-S1  . WISDOM TOOTH EXTRACTION      There were no vitals filed for this visit.   Subjective Assessment - 04/04/21 1402    Subjective Pt presents to PT with reports of muscle soreness after last treatment session and workout at home. He has been compliant with HEP with no adverse effect. Pt is ready to begin PT treatment at this time.    Currently in Pain? Yes    Pain Score 7     Pain Location Back    Pain Orientation Lower                             [facility] Adult PT Treatment/Exercise - 04/04/21 0001      Lumbar Exercises: Aerobic   Nustep lvl 6 x 5 min while taking subjective      Lumbar Exercises: Supine   Pelvic Tilt 10 reps;5 seconds    Pelvic Tilt Limitations supine PPT w/ march 34x10      Knee/Hip Exercises: Standing   Hip Abduction 2 sets;15 reps;Both    Abduction  Limitations green tband    Hip Extension 2 sets;15 reps;Both    Extension Limitations green tband    Wall Squat 2 sets;10 reps    Wall Squat Limitations with red swiss ball    Other Standing Knee Exercises lateral walk green tband 4 laps - at counter      Knee/Hip Exercises: Supine   Straight Leg Raises 2 sets;10 reps;Both    Other Supine Knee/Hip Exercises supine clamshell 2x15 blue tband                    PT Short Term Goals - 02/21/21 1656      PT SHORT TERM GOAL #1   Title Patient will be independent with initial HEP.    Baseline Provided initial HEP only including PPT at eval 02/21/2021 due to time constraints    Time 4    Period Weeks    Status New    Target Date 03/21/21      PT SHORT TERM GOAL #2   Title Patient will improve BLE MMT to >/= 4/5 grossly  Baseline See flowsheet    Time 4    Period Weeks    Status New    Target Date 03/21/21      PT SHORT TERM GOAL #3   Title Patient will report at least 2 consecutive weeks without falls or instances of knees buckling.    Baseline Patient reports frequent knee buckling (bilaterally) that has resulted in several falls    Time 4    Period Weeks    Status New    Target Date 03/21/21      PT SHORT TERM GOAL #4   Title Patient's FOTO score will increase from 32% to >/= 40% functional status to demonstrate improved perceived ability.    Baseline 32% function; predicted 48% function    Time 4    Period Weeks    Status New    Target Date 03/21/21      PT SHORT TERM GOAL #5   Title Patient will ambulate >/= 500 feet indoors on level ground using SPC or LRAD to demonstrate improved ability to perform household and community ambulation.    Baseline Pt able to ambulate short distances (from lobby to therapy gym) but expresses being unable to walk around at the mall or for prolonged distances/time periods    Time 4    Period Weeks    Status New    Target Date 03/21/21             PT Long Term Goals -  02/21/21 1712      PT LONG TERM GOAL #1   Title Patient will be independent with advanced HEP.    Baseline Provided initial HEP only consisting of PPT at eval 02/21/2021 due to time constraints    Time 8    Period Weeks    Status New    Target Date 04/18/21      PT LONG TERM GOAL #2   Title Patient will improve BLE MMT to >/= 4+/5 grossly for improved functional strength.    Baseline See flowsheet    Time 8    Period Weeks    Status New    Target Date 04/18/21      PT LONG TERM GOAL #3   Title Patient's FOTO score will increase from 32% to >/= 48% functional status to demonstrate improved perceived ability.    Baseline 32% function; predicted 48% function    Time 8    Period Weeks    Status New    Target Date 04/18/21      PT LONG TERM GOAL #4   Title Patient will ambulate >/= 1000 feet indoors on level ground using SPC or LRAD to demonstrate improved ability to perform household and community ambulation.    Baseline Pt able to ambulate short distances (from lobby to therapy gym) but expresses being unable to walk around at the mall or for prolonged distances/time periods    Time 8    Period Weeks    Status New    Target Date 04/18/21      PT LONG TERM GOAL #5   Title Patient will report being able to play with his 38 year old son for at least 30 minutes with </= 5/10 low back pain.    Baseline Pt states pain is typically 7/10. He reports one of his primary goals is to be able to get on the floor and play with his son    Time 8    Period Weeks    Status New  Target Date 04/18/21                 Plan - 04/04/21 1443    Clinical Impression Statement Pt once again tolerated treatment better today and was able to complete all prescribed exercises with no adverse effect. He demonstrated improving proximal hip strength and activity tolerance. Pt does continue to have some difficulty with prolonged standing due to fatigue and lower back pain. He continues to benefit and  progress well with therapy and should continue to be seen per POC as prescribed.    PT Treatment/Interventions ADLs/Self Care Home Management;Aquatic Therapy;Cryotherapy;Electrical Stimulation;Iontophoresis 85m/ml Dexamethasone;Moist Heat;DME Instruction;Neuromuscular re-education;Balance training;Therapeutic exercise;Therapeutic activities;Functional mobility training;Stair training;Gait training;Patient/family education;Manual techniques;Energy conservation;Dry needling;Passive range of motion;Taping    PT Next Visit Plan continue to progress core and hip strength as able    PT Home Exercise Plan [email or web address]          Patient will benefit from skilled therapeutic intervention in order to improve the following deficits and impairments:  Abnormal gait,Decreased activity tolerance,Decreased balance,Decreased mobility,Decreased strength,Decreased endurance,Decreased range of motion,Difficulty walking,Decreased knowledge of use of DME,Impaired sensation,Impaired flexibility,Improper body mechanics,Postural dysfunction,Pain,Increased muscle spasms  Visit Diagnosis: Chronic bilateral low back pain with bilateral sciatica  Muscle weakness (generalized)  Difficulty in walking, not elsewhere classified  Other abnormalities of gait and mobility     Problem List Patient Active Problem List   Diagnosis Date Noted  . Flat foot 03/19/2021  . Hepatic hemangioma 01/17/2021  . Grade III internal hemorrhoids 01/17/2021  . Failed back syndrome 01/03/2021  . Facet arthropathy, lumbar 01/03/2021  . Sleep disturbance 12/06/2020  . Abnormality of gait 12/06/2020  . 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/05/2020  . Hyperlipidemia 06/01/2020  . PTSD (post-traumatic stress disorder)   . Hypertension   . Depression   . Anxiety   . Ulcerative colitis ([place] 2019  . Myocardial infarction ([facility] 2015    DWard Chatters PT, DPT 04/04/21 2:46 PM  CL[facility] C[facility] StreetGLeomin[address] [place] [address]Phone: [phone]  Fax:  [phone] Name: CDemontrez RindfleischMRN: [id number] of Birth: [birth year]

## 2021-04-08 ENCOUNTER — Ambulatory Visit: Payer: BC Managed Care – PPO

## 2021-04-11 ENCOUNTER — Ambulatory Visit: Payer: BC Managed Care – PPO

## 2021-04-11 ENCOUNTER — Other Ambulatory Visit: Payer: Self-pay

## 2021-04-11 ENCOUNTER — Ambulatory Visit: Payer: BC Managed Care – PPO | Admitting: Gastroenterology

## 2021-04-11 DIAGNOSIS — M5442 Lumbago with sciatica, left side: Secondary | ICD-10-CM

## 2021-04-11 DIAGNOSIS — M5441 Lumbago with sciatica, right side: Secondary | ICD-10-CM | POA: Diagnosis not present

## 2021-04-11 DIAGNOSIS — G8929 Other chronic pain: Secondary | ICD-10-CM

## 2021-04-11 DIAGNOSIS — R2689 Other abnormalities of gait and mobility: Secondary | ICD-10-CM | POA: Diagnosis not present

## 2021-04-11 DIAGNOSIS — R262 Difficulty in walking, not elsewhere classified: Secondary | ICD-10-CM | POA: Diagnosis not present

## 2021-04-11 DIAGNOSIS — M6281 Muscle weakness (generalized): Secondary | ICD-10-CM | POA: Diagnosis not present

## 2021-04-11 NOTE — Therapy (Signed)
[place], [place], [address] Phone: [phone]   Fax:  [phone]  Physical Therapy Treatment  Patient Details  Name: Daniel Marsh MRN: [id number] Date of Birth: 1981-05-17 Referring Provider (PT): Jamse Arn, MD   Encounter Date: 04/11/2021   PT End of Session - 04/11/21 1450    Visit Number 10    Number of Visits 17    Date for PT Re-Evaluation 04/20/21    Authorization Type BC[employer] - FOTO visit 6 and visit 10    PT Start Time 1448    PT Stop Time 1530    PT Time Calculation (min) 42 min    Activity Tolerance Patient tolerated treatment well;No increased pain    Behavior During Therapy WFL for tasks assessed/performed           Past Medical History:  Diagnosis Date  . Anxiety   . Arthritis   . Depression   . Hypertension   . Myocardial infarction ([place])   . PTSD (post-traumatic stress disorder)   . Ulcerative colitis ([place]) 2019    Past Surgical History:  Procedure Laterality Date  . ANKLE SURGERY Right 2011  . KNEE SURGERY Left 2020  . SPINE SURGERY  2013   Lumbar fusion L4-S1  . WISDOM TOOTH EXTRACTION      There were no vitals filed for this visit.   Subjective Assessment - 04/11/21 1451    Subjective Pt presents to PT with reports of sharply increased LBP. He awoke two days and was unable to do much that day d/t pain. Pt has been compliant with HEP, but has not been able to perform as throughly this week d/t pain. He is ready to begin PT treatment at this time.    Currently in Pain? Yes    Pain Score 10-Worst pain ever    Pain Location Back    Pain Orientation Lower                             [facility] Adult PT Treatment/Exercise - 04/11/21 0001      Lumbar Exercises: Seated   Long Arc Quad on Chair 2 sets;10 reps;Both    LAQ on Chair Weights (lbs) 2    Other Seated Lumbar Exercises seated sciatic nerve glide 2 x 15 ea      Lumbar Exercises:  Supine   Pelvic Tilt 10 reps;5 seconds    Pelvic Tilt Limitations PPT w/ ball squeeze 2x10 - 5 sec hold    Clam Limitations 3x10 blue tband    Bridge 10 reps   x 2   Straight Leg Raise 10 reps   x 2   Other Supine Lumbar Exercises supine ball rollout 2x10 red physiobal      Knee/Hip Exercises: Standing   Extension Limitations 2 x 8 - red tband    Other Standing Knee Exercises lateral walk red tband 3 laps - at counter                    PT Short Term Goals - 02/21/21 1656      PT SHORT TERM GOAL #1   Title Patient will be independent with initial HEP.    Baseline Provided initial HEP only including PPT at eval 02/21/2021 due to time constraints    Time 4    Period Weeks    Status New    Target Date 03/21/21  PT SHORT TERM GOAL #2   Title Patient will improve BLE MMT to >/= 4/5 grossly    Baseline See flowsheet    Time 4    Period Weeks    Status New    Target Date 03/21/21      PT SHORT TERM GOAL #3   Title Patient will report at least 2 consecutive weeks without falls or instances of knees buckling.    Baseline Patient reports frequent knee buckling (bilaterally) that has resulted in several falls    Time 4    Period Weeks    Status New    Target Date 03/21/21      PT SHORT TERM GOAL #4   Title Patient's FOTO score will increase from 32% to >/= 40% functional status to demonstrate improved perceived ability.    Baseline 32% function; predicted 48% function    Time 4    Period Weeks    Status New    Target Date 03/21/21      PT SHORT TERM GOAL #5   Title Patient will ambulate >/= 500 feet indoors on level ground using SPC or LRAD to demonstrate improved ability to perform household and community ambulation.    Baseline Pt able to ambulate short distances (from lobby to therapy gym) but expresses being unable to walk around at the mall or for prolonged distances/time periods    Time 4    Period Weeks    Status New    Target Date 03/21/21              PT Long Term Goals - 02/21/21 1712      PT LONG TERM GOAL #1   Title Patient will be independent with advanced HEP.    Baseline Provided initial HEP only consisting of PPT at eval 02/21/2021 due to time constraints    Time 8    Period Weeks    Status New    Target Date 04/18/21      PT LONG TERM GOAL #2   Title Patient will improve BLE MMT to >/= 4+/5 grossly for improved functional strength.    Baseline See flowsheet    Time 8    Period Weeks    Status New    Target Date 04/18/21      PT LONG TERM GOAL #3   Title Patient's FOTO score will increase from 32% to >/= 48% functional status to demonstrate improved perceived ability.    Baseline 32% function; predicted 48% function    Time 8    Period Weeks    Status New    Target Date 04/18/21      PT LONG TERM GOAL #4   Title Patient will ambulate >/= 1000 feet indoors on level ground using SPC or LRAD to demonstrate improved ability to perform household and community ambulation.    Baseline Pt able to ambulate short distances (from lobby to therapy gym) but expresses being unable to walk around at the mall or for prolonged distances/time periods    Time 8    Period Weeks    Status New    Target Date 04/18/21      PT LONG TERM GOAL #5   Title Patient will report being able to play with his 45 year old son for at least 30 minutes with </= 5/10 low back pain.    Baseline Pt states pain is typically 7/10. He reports one of his primary goals is to be able to get on the floor  and play with his son    Time 8    Period Weeks    Status New    Target Date 04/18/21                 Plan - 04/11/21 1512    Clinical Impression Statement Pt tolerated treatment fair, but was limited d/t increase in LBP. He continues to have functional mobility deficits related to severe lower back pain with referal into L LE. Pt does benefit from PT, but continues ot have progression, then relapse in functional ability and exercise tolerance.  PT will continue to try and progress as able per POC as prescribed.    PT Treatment/Interventions ADLs/Self Care Home Management;Aquatic Therapy;Cryotherapy;Electrical Stimulation;Iontophoresis 62m/ml Dexamethasone;Moist Heat;DME Instruction;Neuromuscular re-education;Balance training;Therapeutic exercise;Therapeutic activities;Functional mobility training;Stair training;Gait training;Patient/family education;Manual techniques;Energy conservation;Dry needling;Passive range of motion;Taping    PT Next Visit Plan continue to progress core and hip strength as able    PT Home Exercise Plan [email or web address]          Patient will benefit from skilled therapeutic intervention in order to improve the following deficits and impairments:  Abnormal gait,Decreased activity tolerance,Decreased balance,Decreased mobility,Decreased strength,Decreased endurance,Decreased range of motion,Difficulty walking,Decreased knowledge of use of DME,Impaired sensation,Impaired flexibility,Improper body mechanics,Postural dysfunction,Pain,Increased muscle spasms  Visit Diagnosis: Chronic bilateral low back pain with bilateral sciatica  Muscle weakness (generalized)  Difficulty in walking, not elsewhere classified  Other abnormalities of gait and mobility     Problem List Patient Active Problem List   Diagnosis Date Noted  . Flat foot 03/19/2021  . Hepatic hemangioma 01/17/2021  . Grade III internal hemorrhoids 01/17/2021  . Failed back syndrome 01/03/2021  . Facet arthropathy, lumbar 01/03/2021  . Sleep disturbance 12/06/2020  . Abnormality of gait 12/06/2020  . 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/05/2020  . Hyperlipidemia 06/01/2020  . PTSD (post-traumatic stress disorder)   . Hypertension   . Depression   . Anxiety   . [place]  . Myocardial infarction ([facility] 2015    DWard Chatters PT, DPT 04/11/21 3:34 PM  CCricketCSelect [facility] [address]Phone: [phone]  Fax:  [phone] Name: CBronc BrosseauMRN: [id number] of Birth: 104/14/1982

## 2021-04-18 ENCOUNTER — Other Ambulatory Visit: Payer: Self-pay

## 2021-04-18 ENCOUNTER — Ambulatory Visit: Payer: BC Managed Care – PPO

## 2021-04-18 DIAGNOSIS — G8929 Other chronic pain: Secondary | ICD-10-CM | POA: Diagnosis not present

## 2021-04-18 DIAGNOSIS — M5441 Lumbago with sciatica, right side: Secondary | ICD-10-CM | POA: Diagnosis not present

## 2021-04-18 DIAGNOSIS — M6281 Muscle weakness (generalized): Secondary | ICD-10-CM | POA: Diagnosis not present

## 2021-04-18 DIAGNOSIS — M5442 Lumbago with sciatica, left side: Secondary | ICD-10-CM | POA: Diagnosis not present

## 2021-04-18 DIAGNOSIS — R262 Difficulty in walking, not elsewhere classified: Secondary | ICD-10-CM

## 2021-04-18 DIAGNOSIS — R2689 Other abnormalities of gait and mobility: Secondary | ICD-10-CM

## 2021-04-18 NOTE — Therapy (Addendum)
[place], [place], [address] Phone: [phone]   Fax:  [phone]  Physical Therapy Treatment/Re-Evaluation  Patient Details  Name: Daniel Marsh MRN: [id number] Date of Birth: Dec 17, 1980 Referring Provider (PT): Jamse Arn, MD   Encounter Date: 04/18/2021   PT End of Session - 04/18/21 1455    Visit Number 11    Number of Visits 17    Date for PT Re-Evaluation 06/06/2021    Authorization Type BCBS - FOTO visit 6 and visit 10    PT Start Time 7948   arrived late   PT Stop Time 1528    PT Time Calculation (min) 35 min    Activity Tolerance Patient tolerated treatment well;No increased pain    Behavior During Therapy WFL for tasks assessed/performed           Past Medical History:  Diagnosis Date  . Anxiety   . Arthritis   . Depression   . Hypertension   . Myocardial infarction ([place])   . PTSD (post-traumatic stress disorder)   . Ulcerative colitis ([place]) 2019    Past Surgical History:  Procedure Laterality Date  . ANKLE SURGERY Right 2011  . KNEE SURGERY Left 2020  . SPINE SURGERY  2013   Lumbar fusion L4-S1  . WISDOM TOOTH EXTRACTION      There were no vitals filed for this visit.   Subjective Assessment - 04/18/21 1455    Subjective Pt presents to PT with reports of increased LBP secondary to fall d/t L knee buckling a few days ago. He has remained compliant with his HEP with adverse effect. Pt is ready to begin PT treatment at this time.    Currently in Pain? Yes    Pain Score 9     Pain Location Back    Pain Orientation Lower;Left    Pain Radiating Towards L LE                             [facility] Adult PT Treatment/Exercise - 04/18/21 0001      Lumbar Exercises: Seated   Long Arc Quad on Chair 2 sets;10 reps;Both    LAQ on Chair Weights (lbs) 2    Other Seated Lumbar Exercises seated sciatic nerve glide 2 x 15 ea      Lumbar Exercises: Supine    Pelvic Tilt 10 reps;5 seconds    Pelvic Tilt Limitations PPT w/ ball squeeze 2x10 - 5 sec hold    Other Supine Lumbar Exercises supine sciatic nerve glide x 15 L LE    Other Supine Lumbar Exercises supine PPTw/ clamshell 2x10 - blue tband      Knee/Hip Exercises: Standing   Hip Abduction 2 sets;10 reps;Both    Hip Extension 2 sets;10 reps;Both                    PT Short Term Goals - 04/18/21 1814      PT SHORT TERM GOAL #1   Title Patient will be independent with initial HEP.    Baseline Provided initial HEP only including PPT at eval 02/21/2021 due to time constraints    Time 4    Period Weeks    Status Achieved    Target Date 03/21/21      PT SHORT TERM GOAL #2   Title Patient will improve BLE MMT to >/= 4/5 grossly    Baseline See flowsheet  Time 4    Period Weeks    Status On-going    Target Date 05/09/21      PT SHORT TERM GOAL #3   Title Patient will report at least 2 consecutive weeks without falls or instances of knees buckling.    Baseline Patient reports frequent knee buckling (bilaterally) that has resulted in several falls    Time 4    Period Weeks    Status On-going    Target Date 05/09/21      PT SHORT TERM GOAL #4   Title Patient's FOTO score will increase from 32% to >/= 40% functional status to demonstrate improved perceived ability. - update: 35% on 5/19    Baseline 32% function; predicted 48% function    Time 4    Period Weeks    Status On-going    Target Date 05/09/21      PT SHORT TERM GOAL #5   Title Patient will ambulate >/= 500 feet indoors on level ground using SPC or LRAD to demonstrate improved ability to perform household and community ambulation.    Baseline Pt able to ambulate short distances (from lobby to therapy gym) but expresses being unable to walk around at the mall or for prolonged distances/time periods    Time 4    Period Weeks    Status Achieved    Target Date 03/21/21             PT Long Term Goals -  04/18/21 1815      PT LONG TERM GOAL #1   Title Patient will be independent with advanced HEP.    Baseline Provided initial HEP only consisting of PPT at eval 02/21/2021 due to time constraints    Time 8    Period Weeks    Status On-going    Target Date 06/06/21      PT LONG TERM GOAL #2   Title Patient will improve BLE MMT to >/= 4+/5 grossly for improved functional strength.    Baseline See flowsheet    Time 8    Period Weeks    Status On-going    Target Date 06/06/21      PT LONG TERM GOAL #3   Title Patient's FOTO score will increase from 32% to >/= 48% functional status to demonstrate improved perceived ability.    Baseline 32% function; predicted 48% function - update: 35% on 04/18/21    Time 8    Period Weeks    Status On-going    Target Date 06/06/21      PT LONG TERM GOAL #4   Title Patient will ambulate >/= 1000 feet indoors on level ground using SPC or LRAD to demonstrate improved ability to perform household and community ambulation.    Baseline Pt able to ambulate short distances (from lobby to therapy gym) but expresses being unable to walk around at the mall or for prolonged distances/time periods    Time 8    Period Weeks    Status On-going    Target Date 06/06/21      PT LONG TERM GOAL #5   Title Patient will report being able to play with his 38 year old son for at least 30 minutes with </= 5/10 low back pain.    Baseline Pt states pain is typically 7/10. He reports one of his primary goals is to be able to get on the floor and play with his son    Time 8    Period Weeks  Status New    Target Date 06/06/21                 Plan - 04/18/21 1817    Clinical Impression Statement Pt tolerated treatment fair but was once again limited by LBP radiating into L LE. Sujectively pt feels he has improved with PT services and is progressing well, albeit slowly. His FOTO score improved from evaluation to 35% today. He also notes improvement in severity of  symptoms overall with decreased time between flare ups of pain. Pt continues to benefit from skilled PT services and should continue to be seen and progressed per POC as tolerated.    PT Frequency 2x / week    PT Duration 8 weeks    PT Treatment/Interventions ADLs/Self Care Home Management;Aquatic Therapy;Cryotherapy;Electrical Stimulation;Iontophoresis 79m/ml Dexamethasone;Moist Heat;DME Instruction;Neuromuscular re-education;Balance training;Therapeutic exercise;Therapeutic activities;Functional mobility training;Stair training;Gait training;Patient/family education;Manual techniques;Energy conservation;Dry needling;Passive range of motion;Taping    PT Next Visit Plan continue to progress core and hip strength as able    PT Home Exercise Plan [id number]    Consulted and Agree with Plan of Care Patient           Patient will benefit from skilled therapeutic intervention in order to improve the following deficits and impairments:  Abnormal gait,Decreased activity tolerance,Decreased balance,Decreased mobility,Decreased strength,Decreased endurance,Decreased range of motion,Difficulty walking,Decreased knowledge of use of DME,Impaired sensation,Impaired flexibility,Improper body mechanics,Postural dysfunction,Pain,Increased muscle spasms  Visit Diagnosis: Chronic bilateral low back pain with bilateral sciatica - Plan: PT plan of care cert/re-cert  Muscle weakness (generalized) - Plan: PT plan of care cert/re-cert  Difficulty in walking, not elsewhere classified - Plan: PT plan of care cert/re-cert  Other abnormalities of gait and mobility - Plan: PT plan of care cert/re-cert     Problem List Patient Active Problem List   Diagnosis Date Noted  . Flat foot 03/19/2021  . Hepatic hemangioma 01/17/2021  . Grade III internal hemorrhoids 01/17/2021  . Failed back syndrome 01/03/2021  . Facet arthropathy, lumbar 01/03/2021  . Sleep disturbance 12/06/2020  . Abnormality of gait 12/06/2020  .  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/05/2020  . Hyperlipidemia 06/01/2020  . PTSD (post-traumatic stress disorder)   . Hypertension   . Depression   . Anxiety   . [place]  . Myocardial infarction ([facility] 2015   DWard Chatters PT, DPT 04/18/21 6:22 PM  [facility] [address]Phone: [phone]  Fax:  [phone] Name: CCasimir BarcellosMRN: [id number] of Birth: 102-22-1982

## 2021-04-19 ENCOUNTER — Ambulatory Visit: Payer: BC Managed Care – PPO | Admitting: Psychologist

## 2021-04-22 ENCOUNTER — Encounter: Payer: Self-pay | Admitting: Physical Therapy

## 2021-04-22 ENCOUNTER — Ambulatory Visit: Payer: BC Managed Care – PPO

## 2021-04-24 ENCOUNTER — Ambulatory Visit: Payer: BC Managed Care – PPO | Admitting: Gastroenterology

## 2021-04-25 ENCOUNTER — Ambulatory Visit: Payer: BC Managed Care – PPO

## 2021-04-30 ENCOUNTER — Ambulatory Visit: Payer: BC Managed Care – PPO | Admitting: Physical Therapy

## 2021-05-02 ENCOUNTER — Ambulatory Visit
Payer: BC Managed Care – PPO | Attending: Physical Medicine & Rehabilitation | Admitting: Rehabilitative and Restorative Service Providers"

## 2021-05-02 ENCOUNTER — Encounter: Payer: Self-pay | Admitting: Rehabilitative and Restorative Service Providers"

## 2021-05-02 ENCOUNTER — Other Ambulatory Visit: Payer: Self-pay

## 2021-05-02 DIAGNOSIS — R262 Difficulty in walking, not elsewhere classified: Secondary | ICD-10-CM | POA: Insufficient documentation

## 2021-05-02 DIAGNOSIS — G8929 Other chronic pain: Secondary | ICD-10-CM | POA: Diagnosis not present

## 2021-05-02 DIAGNOSIS — R2689 Other abnormalities of gait and mobility: Secondary | ICD-10-CM | POA: Insufficient documentation

## 2021-05-02 DIAGNOSIS — M6281 Muscle weakness (generalized): Secondary | ICD-10-CM | POA: Diagnosis not present

## 2021-05-02 DIAGNOSIS — M5441 Lumbago with sciatica, right side: Secondary | ICD-10-CM | POA: Insufficient documentation

## 2021-05-02 DIAGNOSIS — M5442 Lumbago with sciatica, left side: Secondary | ICD-10-CM | POA: Diagnosis not present

## 2021-05-02 NOTE — Therapy (Signed)
[place], [place], [address] Phone: [phone]   Fax:  [phone]  Physical Therapy Treatment  Patient Details  Name: Daniel Marsh MRN: [id number] Date of Birth: 1981-08-19 Referring Provider (PT): Daniel Arn, MD   Encounter Date: 05/02/2021   PT End of Session - 05/02/21 1443    Visit Number 12    Number of Visits 17    PT Start Time 0223    PT Stop Time 0300    PT Time Calculation (min) 37 min    Activity Tolerance Patient tolerated treatment well;No increased pain    Behavior During Therapy WFL for tasks assessed/performed           Past Medical History:  Diagnosis Date  . Anxiety   . Arthritis   . Depression   . Hypertension   . Myocardial infarction ([place])   . PTSD (post-traumatic stress disorder)   . Ulcerative Marsh ([place]) 2019    Past Surgical History:  Procedure Laterality Date  . ANKLE SURGERY Right 2011  . KNEE SURGERY Left 2020  . SPINE SURGERY  2013   Lumbar fusion L4-S1  . WISDOM TOOTH EXTRACTION      There were no vitals filed for this visit.   Subjective Assessment - 05/02/21 1425    Subjective Radiation is separate from constant back pain. Radiation happens intermittently. Back pain is becoming more manageable. I find I am having more endurance.    Currently in Pain? Yes    Pain Score 7     Pain Location Back    Pain Orientation Lower;Left    Pain Descriptors / Indicators Aching;Sharp    Pain Type Chronic pain                             [facility] Adult PT Treatment/Exercise - 05/02/21 0001      Lumbar Exercises: Supine   Other Supine Lumbar Exercises tilt x 20 with maximal PT tactile and verbal cues for technique and proper muscle activation, tilt with knee flex/ext x 15; tilt with bil clam shell x 15; tilt with oblique heel reach x 20    Other Supine Lumbar Exercises tilt with bridge x 15; tilt with long axis distraction x 15,  knee to chest/alt hip flexor stretch contralateral LE 2x20 sec bil; butterfly stretch 3x20 sec; hamstring stretch 2x20 sec with ankle pump; knee to opposite shoulder stretch 2x20 sec                  PT Education - 05/02/21 1457    Education Details discussed benefits of doing exercises to decrease risk of knee buckling and/or AFOs, discussed log roll technique    Person(s) Educated Patient    Methods Explanation    Comprehension Verbalized understanding            PT Short Term Goals - 05/02/21 1427      PT SHORT TERM GOAL #2   Title Patient will improve BLE MMT to >/= 4/5 grossly    Status On-going      PT SHORT TERM GOAL #3   Title Patient will report at least 2 consecutive weeks without falls or instances of knees buckling.    Status On-going      PT SHORT TERM GOAL #4   Title Patient's FOTO score will increase from 32% to >/= 40% functional status to demonstrate improved perceived ability. - update: 35% on 5/19  Status On-going             PT Long Term Goals - 05/02/21 1428      PT LONG TERM GOAL #4   Title Patient will ambulate >/= 1000 feet indoors on level ground using SPC or LRAD to demonstrate improved ability to perform household and community ambulation.                 Plan - 05/02/21 1446    Clinical Impression Statement Pt with continued complaints of LBP and L LE radiating pain with functional mobility. He was able to walk a half lap around [place] 2 days ago and reports increased soreness the next day. he reports functional endurance to be improving. Pt would benefit from further lumbar/core and neural glide exercises as needed to help with pain management. Pt late to today's visit.    PT Treatment/Interventions ADLs/Self Care Home Management;Aquatic Therapy;Cryotherapy;Electrical Stimulation;Iontophoresis 55m/ml Dexamethasone;Moist Heat;DME Instruction;Neuromuscular re-education;Balance training;Therapeutic exercise;Therapeutic  activities;Functional mobility training;Stair training;Gait training;Patient/family education;Manual techniques;Energy conservation;Dry needling;Passive range of motion;Taping    PT Next Visit Plan continue to progress core and hip strength as able    Consulted and Agree with Plan of Care Patient           Patient will benefit from skilled therapeutic intervention in order to improve the following deficits and impairments:  Abnormal gait,Decreased activity tolerance,Decreased balance,Decreased mobility,Decreased strength,Decreased endurance,Decreased range of motion,Difficulty walking,Decreased knowledge of use of DME,Impaired sensation,Impaired flexibility,Improper body mechanics,Postural dysfunction,Pain,Increased muscle spasms  Visit Diagnosis: Chronic bilateral low back pain with bilateral sciatica  Muscle weakness (generalized)  Difficulty in walking, not elsewhere classified  Other abnormalities of gait and mobility     Problem List Patient Active Problem List   Diagnosis Date Noted  . Flat foot 03/19/2021  . Hepatic hemangioma 01/17/2021  . Grade III internal hemorrhoids 01/17/2021  . Failed back syndrome 01/03/2021  . Facet arthropathy, lumbar 01/03/2021  . Sleep disturbance 12/06/2020  . Abnormality of gait 12/06/2020  . 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/05/2020  . Hyperlipidemia 06/01/2020  . PTSD (post-traumatic stress disorder)   . Hypertension   . Depression   . Anxiety   . [place]cer[place] 2019  . Myocardial infarction ([facility] 2015    Daniel Marsh 05/02/2021, 3:02 PM  [facility] [place] [address]Phone: [phone]  Fax:  [phone] Name: Daniel VandegriftMRN: [id number] of Birth: [birth year]

## 2021-05-07 ENCOUNTER — Ambulatory Visit: Payer: BC Managed Care – PPO

## 2021-05-09 ENCOUNTER — Telehealth: Payer: Self-pay | Admitting: Physical Therapy

## 2021-05-09 ENCOUNTER — Ambulatory Visit: Payer: BC Managed Care – PPO | Admitting: Physical Therapy

## 2021-05-09 NOTE — Telephone Encounter (Signed)
Left voice mail for building closure and appointment cancellation.

## 2021-05-14 ENCOUNTER — Ambulatory Visit: Payer: BC Managed Care – PPO

## 2021-05-14 ENCOUNTER — Other Ambulatory Visit: Payer: Self-pay

## 2021-05-14 DIAGNOSIS — R2689 Other abnormalities of gait and mobility: Secondary | ICD-10-CM | POA: Diagnosis not present

## 2021-05-14 DIAGNOSIS — R262 Difficulty in walking, not elsewhere classified: Secondary | ICD-10-CM

## 2021-05-14 DIAGNOSIS — M6281 Muscle weakness (generalized): Secondary | ICD-10-CM

## 2021-05-14 DIAGNOSIS — G8929 Other chronic pain: Secondary | ICD-10-CM | POA: Diagnosis not present

## 2021-05-14 DIAGNOSIS — M5441 Lumbago with sciatica, right side: Secondary | ICD-10-CM | POA: Diagnosis not present

## 2021-05-14 DIAGNOSIS — M5442 Lumbago with sciatica, left side: Secondary | ICD-10-CM | POA: Diagnosis not present

## 2021-05-14 NOTE — Therapy (Addendum)
[place], [place], [address] Phone: [phone]   Fax:  [phone]  Physical Therapy Treatment/discharge  Patient Details  Name: Daniel Marsh MRN: [id number] Date of Birth: 29-Nov-1981 Referring Provider (PT): Jamse Arn, MD   Encounter Date: 05/14/2021   PT End of Session - 05/14/21 1535     Visit Number 13    Number of Visits 17    Date for PT Re-Evaluation 06/06/21    Authorization Type BCBS - FOTO visit 6 and visit 10    PT Start Time 1533    PT Stop Time 1611    PT Time Calculation (min) 38 min    Activity Tolerance Patient tolerated treatment well;No increased pain    Behavior During Therapy WFL for tasks assessed/performed             Past Medical History:  Diagnosis Date   Anxiety    Arthritis    Depression    Hypertension    Myocardial infarction [facility])    PTSD (post-traumatic stress disorder)    Ulcerative colitis ([place]) 2019    Past Surgical History:  Procedure Laterality Date   ANKLE SURGERY Right 2011   KNEE SURGERY Left 2020   SPINE SURGERY  2013   Lumbar fusion L4-S1   WISDOM TOOTH EXTRACTION      There were no vitals filed for this visit.   Subjective Assessment - 05/14/21 1536     Subjective Pt presents to PT with reports of continued lower back pain. He states he was pretty much "bed ridden" over the last few days. Pt has been compliant with his HEP with some continued discomfort. Pt is ready to begin PT at this time.    Currently in Pain? Yes    Pain Score 8     Pain Location Back    Pain Orientation Lower                               [facility] Adult PT Treatment/Exercise - 05/14/21 0001       Lumbar Exercises: Aerobic   Nustep lvl 6 UE/LE x 5 min while taking subjective      Lumbar Exercises: Standing   Other Standing Lumbar Exercises paloff press 2x10 3lbs      Lumbar Exercises: Supine   Pelvic Tilt 10 reps;5 seconds     Pelvic Tilt Limitations PPT w/ ball squeeze 2x10 - 5 sec hold    Bridge 10 reps   x2   Bridge Limitations x 5 w/ alternating march    Straight Leg Raise 10 reps   x 3 ea   Isometric Hip Flexion Limitations 90/90 table top x 3 - 15 sec holds    Other Supine Lumbar Exercises supine PPT w/ alternating march 2x10      Knee/Hip Exercises: Standing   Hip Extension 2 sets;10 reps;Both    Functional Squat 2 sets;10 reps    Functional Squat Limitations holding free motion                      PT Short Term Goals - 05/02/21 1427       PT SHORT TERM GOAL #2   Title Patient will improve BLE MMT to >/= 4/5 grossly    Status On-going      PT SHORT TERM GOAL #3   Title Patient will report at least 2 consecutive weeks without falls  or instances of knees buckling.    Status On-going      PT SHORT TERM GOAL #4   Title Patient's FOTO score will increase from 32% to >/= 40% functional status to demonstrate improved perceived ability. - update: 35% on 5/19    Status On-going               PT Long Term Goals - 05/02/21 1428       PT LONG TERM GOAL #4   Title Patient will ambulate >/= 1000 feet indoors on level ground using SPC or LRAD to demonstrate improved ability to perform household and community ambulation.                   Plan - 05/14/21 1612     Clinical Impression Statement Pt was able to complete prescribed exercises with improved activity tolerance. Pt was able to progress to standing core strengthening today with increased fatigue noted. PT will continue to progress and assess response to PT at next session.    PT Treatment/Interventions ADLs/Self Care Home Management;Aquatic Therapy;Cryotherapy;Electrical Stimulation;Iontophoresis 57m/ml Dexamethasone;Moist Heat;DME Instruction;Neuromuscular re-education;Balance training;Therapeutic exercise;Therapeutic activities;Functional mobility training;Stair training;Gait training;Patient/family education;Manual  techniques;Energy conservation;Dry needling;Passive range of motion;Taping    PT Next Visit Plan continue to progress core and hip strength as able    PT Home Exercise Plan [id number]   Consulted and Agree with Plan of Care Patient             Patient will benefit from skilled therapeutic intervention in order to improve the following deficits and impairments:  Abnormal gait, Decreased activity tolerance, Decreased balance, Decreased mobility, Decreased strength, Decreased endurance, Decreased range of motion, Difficulty walking, Decreased knowledge of use of DME, Impaired sensation, Impaired flexibility, Improper body mechanics, Postural dysfunction, Pain, Increased muscle spasms  Visit Diagnosis: Chronic bilateral low back pain with bilateral sciatica  Muscle weakness (generalized)  Difficulty in walking, not elsewhere classified  Other abnormalities of gait and mobility     Problem List Patient Active Problem List   Diagnosis Date Noted   Flat foot 03/19/2021   Hepatic hemangioma 01/17/2021   Grade III internal hemorrhoids 01/17/2021   Failed back syndrome 01/03/2021   Facet arthropathy, lumbar 01/03/2021   Sleep disturbance 12/06/2020   Abnormality of gait 12/06/2020   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/05/2020   Hyperlipidemia 06/01/2020   PTSD (post-traumatic stress disorder)    Hypertension    Depression    Anxiety    [place]   Myocardial infarction ([facility] 2015    DWard Chatters PT, DPT 05/14/21 4:15 PM  [facility] [place] [address]Phone: [phone]  Fax:  [phone] Name: Daniel PasseMRN: [id number] of Birth: [birth year]  PHYSICAL THERAPY DISCHARGE SUMMARY  Visits from Start of Care: 13  Current functional level related to goals / functional outcomes: unknown   Remaining deficits: unknown   Education /  Equipment: HEP  Patient agrees to discharge. Patient goals were not met. Patient is being discharged due to not returning since the last visit.   NAmerica Brown PT, DPT

## 2021-05-16 ENCOUNTER — Ambulatory Visit: Payer: BC Managed Care – PPO | Admitting: Rehabilitative and Restorative Service Providers"

## 2021-05-17 ENCOUNTER — Other Ambulatory Visit: Payer: Self-pay

## 2021-05-17 ENCOUNTER — Encounter: Payer: Self-pay | Admitting: Registered Nurse

## 2021-05-17 ENCOUNTER — Encounter: Payer: BC Managed Care – PPO | Attending: Physical Medicine & Rehabilitation | Admitting: Registered Nurse

## 2021-05-17 VITALS — BP 121/79 | HR 81 | Temp 98.3°F | Ht 72.0 in | Wt 203.0 lb

## 2021-05-17 DIAGNOSIS — G479 Sleep disorder, unspecified: Secondary | ICD-10-CM | POA: Diagnosis not present

## 2021-05-17 DIAGNOSIS — M25562 Pain in left knee: Secondary | ICD-10-CM | POA: Insufficient documentation

## 2021-05-17 DIAGNOSIS — Z79899 Other long term (current) drug therapy: Secondary | ICD-10-CM | POA: Diagnosis not present

## 2021-05-17 DIAGNOSIS — M961 Postlaminectomy syndrome, not elsewhere classified: Secondary | ICD-10-CM | POA: Insufficient documentation

## 2021-05-17 DIAGNOSIS — G8929 Other chronic pain: Secondary | ICD-10-CM | POA: Diagnosis not present

## 2021-05-17 DIAGNOSIS — M25552 Pain in left hip: Secondary | ICD-10-CM | POA: Insufficient documentation

## 2021-05-17 DIAGNOSIS — W19XXXD Unspecified fall, subsequent encounter: Secondary | ICD-10-CM | POA: Insufficient documentation

## 2021-05-17 DIAGNOSIS — F17210 Nicotine dependence, cigarettes, uncomplicated: Secondary | ICD-10-CM | POA: Diagnosis not present

## 2021-05-17 DIAGNOSIS — M5416 Radiculopathy, lumbar region: Secondary | ICD-10-CM

## 2021-05-17 DIAGNOSIS — R269 Unspecified abnormalities of gait and mobility: Secondary | ICD-10-CM | POA: Diagnosis not present

## 2021-05-17 DIAGNOSIS — M47816 Spondylosis without myelopathy or radiculopathy, lumbar region: Secondary | ICD-10-CM | POA: Insufficient documentation

## 2021-05-17 DIAGNOSIS — Y92009 Unspecified place in unspecified non-institutional (private) residence as the place of occurrence of the external cause: Secondary | ICD-10-CM | POA: Insufficient documentation

## 2021-05-17 DIAGNOSIS — F17220 Nicotine dependence, chewing tobacco, uncomplicated: Secondary | ICD-10-CM | POA: Insufficient documentation

## 2021-05-17 DIAGNOSIS — M25561 Pain in right knee: Secondary | ICD-10-CM | POA: Diagnosis not present

## 2021-05-17 DIAGNOSIS — M4726 Other spondylosis with radiculopathy, lumbar region: Secondary | ICD-10-CM | POA: Diagnosis not present

## 2021-05-17 DIAGNOSIS — I252 Old myocardial infarction: Secondary | ICD-10-CM | POA: Insufficient documentation

## 2021-05-17 MED ORDER — GABAPENTIN 600 MG PO TABS: 1200.0000 mg | ORAL_TABLET | Freq: Three times a day (TID) | ORAL | 2 refills | Status: DC

## 2021-05-17 MED ORDER — AMITRIPTYLINE HCL 25 MG PO TABS: 25.0000 mg | ORAL_TABLET | Freq: Every day | ORAL | 2 refills | Status: DC

## 2021-05-17 NOTE — Patient Instructions (Signed)
[facility]:   After [facility] [place]        [phone]) [phone][Image of the front of the [facility] building where the [facility] [place] is located.][facility] [id number] [place], [place] here to get a Google map to the Urgent Care location (opens in a new tab or window)EVENINGS & WEEKENDSNO APPOINTMENT NECESSARYMon-Fri  5:30PM - 9PMSat 9 AM - 2 PMSun 10 AM - 2 PM

## 2021-05-17 NOTE — Progress Notes (Signed)
Subjective:    Patient ID: Daniel Marsh, male    DOB: [birth year], 40 y.o.   MRN: [id number]  HPI: [name] is a 40 y.o. male who returns for follow up appointment for chronic pain and medication refill. He states his pain is located in his lower back radiating into his left hip and bilateral knee pain L>R. He also reports he had a fall two weeks ago he was getting out of his chair when his left knee buckle and he fell o his left side. His wife helped him up, he didn't seek medical attention.  Mr. Wimberly [facility] to follow up on his left hip and bilateral knee pain. He was instructed to send a My-Chart message with up-date. He verbalizes understanding.  He rates his pain 8. His  current exercise regime is walking and performing stretching exercises.     Pain Inventory Average Pain 8 Pain Right Now 8 My pain is constant, sharp, burning, dull, stabbing, tingling, and aching  In the last 24 hours, has pain interfered with the following? General activity 8 Relation with others 8 Enjoyment of life 9 What TIME of day is your pain at its worst? morning , daytime, evening, and night Sleep (in general) Poor  Pain is worse with: walking, bending, sitting, inactivity, standing, unsure, and some activites Pain improves with: medication Relief from Meds: 8  Family History  Problem Relation Age of Onset   Osteoarthritis Mother    Depression Mother    Heart attack Mother    Osteoarthritis Father    Depression Father    Depression Brother    Diabetes Maternal Grandmother    Hyperlipidemia Maternal Grandmother    Hypertension Maternal Grandmother    Heart attack Maternal Grandmother    Osteoarthritis Maternal Grandfather    COPD Maternal Grandfather    Kidney disease Maternal Grandfather    Renal cancer Maternal Grandfather    Alcohol abuse Paternal Grandmother    COPD Paternal Grandmother    Diabetes Paternal Grandmother    Hypertension  Paternal Grandmother    Lung cancer Paternal Grandmother    Osteoarthritis Paternal Grandfather    Heart attack Paternal Grandfather    Heart failure Paternal Grandfather    Depression Brother    Pancreatic cancer Maternal Aunt    Lung cancer Paternal Aunt    Colon cancer Maternal Uncle    Social History   Socioeconomic History   Marital status: Married    Spouse name: Not on file   Number of children: 3   Years of education: Not on file   Highest education level: Not on file  Occupational History   Not on file  Tobacco Use   Smoking status: Some Days    Pack years: 0.00    Types: Cigarettes, Cigars   Smokeless tobacco: Current    Types: Chew  Vaping Use   Vaping Use: Never used  Substance and Sexual Activity   Alcohol use: Yes    Alcohol/week: 13.0 standard drinks    Types: 10 Cans of beer, 3 Shots of liquor per week   Drug use: Yes    Types: Marijuana    Comment: occ   Sexual activity: Yes  Other Topics Concern   Not on file  Social History Narrative   Was in the [employer] for 7 years, required lumbar surgery while in the [employer] and had poor outcome   Divorced and re-married   70 yo son   Social Determinants  of Health   Financial Resource Strain: Not on file  Food Insecurity: Not on file  Transportation Needs: Not on file  Physical Activity: Not on file  Stress: Not on file  Social Connections: Not on file   Past Surgical History:  Procedure Laterality Date   ANKLE SURGERY Right 2011   KNEE SURGERY Left 2020   [place]  2013   Lumbar fusion L4-S1   WISDOM TOOTH EXTRACTION     Past Surgical History:  Procedure Laterality Date   ANKLE SURGERY Right 2011   KNEE SURGERY Left 2020   SPINE SURGERY  2013   Lumbar fusion L4-S1   WISDOM TOOTH EXTRACTION     Past Medical History:  Diagnosis Date   Anxiety    Arthritis    Depression    Hypertension    Myocardial infarction [facility])    PTSD (post-traumatic stress disorder)    Ulcerative colitis  ([place]) 2019   BP 121/79   Pulse 81   Temp 98.3 F (36.8 C)   Ht 6' (1.829 m)   Wt 203 lb (92.1 kg)   SpO2 97%   BMI 27.53 kg/m   Opioid Risk Score:   Fall Risk Score:  `1  Depression screen PHQ 2/9  Depression screen [facility] 2/9 03/19/2021 11/05/2020 10/08/2020 05/30/2020  Decreased Interest 1 3 3 2   Down, Depressed, Hopeless 1 3 3 3   PHQ - 2 Score 2 6 6 5   Altered sleeping 1 3 1 3   Tired, decreased energy - 3 3 2   Change in appetite - 3 3 3   Feeling bad or failure about yourself  - 3 3 2   Trouble concentrating - 3 3 2   Moving slowly or fidgety/restless - 2 2 2   Suicidal thoughts - 1 1 1   PHQ-9 Score 3 24 22 20   Difficult doing work/chores - Very difficult Very difficult Somewhat difficult    Review of Systems  Cardiovascular:  Negative for leg swelling.  Musculoskeletal:  Positive for back pain and gait problem.       Pain in knees & hips  All other systems reviewed and are negative.     Objective:   Physical Exam Vitals and nursing note reviewed.  Constitutional:      Appearance: Normal appearance.  Cardiovascular:     Rate and Rhythm: Normal rate and regular rhythm.     Pulses: Normal pulses.     Heart sounds: Normal heart sounds.  Pulmonary:     Effort: Pulmonary effort is normal.     Breath sounds: Normal breath sounds.  Musculoskeletal:     Cervical back: Normal range of motion and neck supple.     Comments: Normal Muscle Bulk and Muscle Testing Reveals:  Upper Extremities: Full ROM and Muscle Strength 5/5  Lumbar Hypersensitivity Left Greater Trochanter Tenderness Lower Extremities: Right Full ROM and Muscle Strength 5/5 Left Lower Extremity: Decreased ROM and Muscle Strength 5/5  Left Lower Extremity Flexion Produces Pain into his Left Patella Arises from Table Slowly using cane for support Antalgic Gait     Skin:    General: Skin is warm and dry.  Neurological:     Mental Status: He is alert and oriented to person, place, and time.  Psychiatric:         Mood and Affect: Mood normal.        Behavior: Behavior normal.          Assessment & Plan:  Failed Back Syndrome: Continue HEP as Tolerated. Continue  current medication regimen. Continue to monitor.  Facet Arthropathy, Lumbar: Continue HEP as Tolerated. Continue current medication regimen. Continue to monitor.  Left Lumbar Radiculitis: Continue Gabapentin. Continue to Monitor.  Abnormality of Gait: Continue use cane. Continue to monitor.  Chronic Bilateral Knee pain: L>R: Will F?U with Orthopedist today. Continue to monitor Chronic Left Hip Pain: He will F/U with Orthopedist today. Continue to Monitor.  Sleep Disturbance: Continue Elavil at H. Continue to Monitor.  Fall at Home: Educated on Fall prevention, he verbalizes understanding. Continue to monitor.  F/U in 3 months

## 2021-05-20 ENCOUNTER — Encounter: Payer: Self-pay | Admitting: Registered Nurse

## 2021-06-25 ENCOUNTER — Other Ambulatory Visit: Payer: Self-pay

## 2021-06-25 ENCOUNTER — Ambulatory Visit: Payer: BC Managed Care – PPO | Admitting: Physician Assistant

## 2021-06-25 ENCOUNTER — Encounter: Payer: Self-pay | Admitting: Physician Assistant

## 2021-06-25 VITALS — BP 136/88 | HR 63 | Temp 98.3°F | Ht 72.0 in | Wt 203.2 lb

## 2021-06-25 DIAGNOSIS — M47816 Spondylosis without myelopathy or radiculopathy, lumbar region: Secondary | ICD-10-CM | POA: Diagnosis not present

## 2021-06-25 DIAGNOSIS — R269 Unspecified abnormalities of gait and mobility: Secondary | ICD-10-CM | POA: Diagnosis not present

## 2021-06-25 DIAGNOSIS — M5441 Lumbago with sciatica, right side: Secondary | ICD-10-CM

## 2021-06-25 DIAGNOSIS — G8929 Other chronic pain: Secondary | ICD-10-CM

## 2021-06-25 NOTE — Progress Notes (Signed)
Established Patient Office Visit  Subjective:  Patient ID: Daniel Marsh, male    DOB: [birth year]  Age: 40 y.o. MRN: [id number]  CC:  Chief Complaint  Patient presents with   Referral    HPI Daniel Marsh presents for referral for physical therapy. He was in PT with Octavio Manns in May and felt like there was improvement, but would like for it to be continued. Hx of spinal surgery in 2012 and says his sciatic nerve was cut. He is in constant pain. Uses a cane to ambulate. He also sees Physical Medicine and Rehab. No new injuries or symptoms.   Past Medical History:  Diagnosis Date   Anxiety    Arthritis    Depression    Hypertension    Myocardial infarction [facility])    PTSD (post-traumatic stress disorder)    Ulcerative colitis ([place]) 2019    Past Surgical History:  Procedure Laterality Date   ANKLE SURGERY Right 2011   KNEE SURGERY Left 2020   SPINE SURGERY  2013   Lumbar fusion L4-S1   WISDOM TOOTH EXTRACTION      Family History  Problem Relation Age of Onset   Osteoarthritis Mother    Depression Mother    Heart attack Mother    Osteoarthritis Father    Depression Father    Depression Brother    Diabetes Maternal Grandmother    Hyperlipidemia Maternal Grandmother    Hypertension Maternal Grandmother    Heart attack Maternal Grandmother    Osteoarthritis Maternal Grandfather    COPD Maternal Grandfather    Kidney disease Maternal Grandfather    Renal cancer Maternal Grandfather    Alcohol abuse Paternal Grandmother    COPD Paternal Grandmother    Diabetes Paternal Grandmother    Hypertension Paternal Grandmother    Lung cancer Paternal Grandmother    Osteoarthritis Paternal Grandfather    Heart attack Paternal Grandfather    Heart failure Paternal Grandfather    Depression Brother    Pancreatic cancer Maternal Aunt    Lung cancer Paternal Aunt    Colon cancer Maternal Uncle     Social History   Socioeconomic History   Marital status:  Married    Spouse name: Not on file   Number of children: 3   Years of education: Not on file   Highest education level: Not on file  Occupational History   Not on file  Tobacco Use   Smoking status: Some Days    Types: Cigarettes, Cigars   Smokeless tobacco: Current    Types: Chew  Vaping Use   Vaping Use: Never used  Substance and Sexual Activity   Alcohol use: Yes    Alcohol/week: 13.0 standard drinks    Types: 10 Cans of beer, 3 Shots of liquor per week   Drug use: Yes    Types: Marijuana    Comment: occ   Sexual activity: Yes  Other Topics Concern   Not on file  Social History Narrative   ** Merged History Encounter **       Was in the [employer] for 7 years, required lumbar surgery while in the [employer] and had poor outcome Divorced and re-married 41 yo son    Social Determinants of Radio broadcast assistant Strain: Not on file  Food Insecurity: Not on file  Transportation Needs: Not on file  Physical Activity: Not on file  Stress: Not on file  Social Connections: Not on file  Intimate Partner Violence: Not on file  Outpatient Medications Prior to Visit  Medication Sig Dispense Refill   amitriptyline (ELAVIL) 25 MG tablet Take 1 tablet (25 mg total) by mouth at bedtime. 30 tablet 2   diazepam (VALIUM) 5 MG tablet Take 1 tablet (5 mg total) by mouth at bedtime. 30 tablet 2   dicyclomine (BENTYL) 10 MG capsule Take 1 capsule (10 mg total) by mouth 3 (three) times daily as needed (abdominal pain/ cramping). 90 capsule 6   gabapentin (NEURONTIN) 600 MG tablet Take 2 tablets (1,200 mg total) by mouth 3 (three) times daily. 180 tablet 2   hydrocortisone (ANUSOL-HC) 25 MG suppository Use 1 suppository rectally at night time for 5-7 night, then use as needed for hemorrhoidal bleeding. (Patient taking differently: Place 25 mg rectally as needed. Use 1 suppository rectally at night time for 5-7 night, then use as needed for hemorrhoidal bleeding.) 12 suppository 2    omeprazole (PRILOSEC) 40 MG capsule Take 1 capsule (40 mg total) by mouth daily. 90 capsule 3   prazosin (MINIPRESS) 1 MG capsule Take 2 capsules (2 mg total) by mouth at bedtime. 180 capsule 1   vortioxetine HBr (TRINTELLIX) 10 MG TABS tablet Take 1 tablet (10 mg total) by mouth daily. (Patient taking differently: Take 20 mg by mouth daily.) 90 tablet 1   AMBULATORY NON FORMULARY MEDICATION Nitroglycerin Ointment 0.125 a pea sized amount per rectum twice daily for 8 weeks. 30 g 1   ibuprofen (ADVIL) 800 MG tablet Take 800 mg by mouth 3 (three) times daily as needed.     mesalamine (APRISO) 0.375 g 24 hr capsule Take 1 capsule (0.375 g total) by mouth in the morning, at noon, in the evening, and at bedtime. 120 capsule 3   No facility-administered medications prior to visit.    No Known Allergies  ROS Review of Systems REFER TO HPI FOR PERTINENT POSITIVES AND NEGATIVES    Objective:    Physical Exam Vitals and nursing note reviewed.  Constitutional:      Comments: Using a cane for ambulation  HENT:     Mouth/Throat:     Mouth: Mucous membranes are moist.  Eyes:     Extraocular Movements: Extraocular movements intact.     Pupils: Pupils are equal, round, and reactive to light.  Cardiovascular:     Rate and Rhythm: Normal rate and regular rhythm.     Pulses: Normal pulses.  Pulmonary:     Effort: Pulmonary effort is normal.     Breath sounds: Normal breath sounds.  Neurological:     Gait: Gait abnormal.  Psychiatric:        Mood and Affect: Mood normal.        Behavior: Behavior normal.    BP 136/88   Pulse 63   Temp 98.3 F (36.8 C)   Ht 6' (1.829 m)   Wt 203 lb 3.2 oz (92.2 kg)   SpO2 99%   BMI 27.56 kg/m  Wt Readings from Last 3 Encounters:  06/25/21 203 lb 3.2 oz (92.2 kg)  05/17/21 203 lb (92.1 kg)  03/19/21 213 lb (96.6 kg)     Health Maintenance Due  Topic Date Due   Pneumococcal Vaccine 42-46 Years old (1 - PCV) Never done   COVID-19 Vaccine (3 -  Booster for Pfizer series) 08/26/2020    There are no preventive care reminders to display for this patient.  Lab Results  Component Value Date   TSH 0.72 11/21/2020   Lab Results  Component Value Date  WBC 6.3 03/08/2021   HGB 13.9 03/08/2021   HCT 42.3 03/08/2021   MCV 90.2 03/08/2021   PLT 323 03/08/2021   Lab Results  Component Value Date   NA 142 03/08/2021   K 4.8 03/08/2021   CO2 28 03/08/2021   GLUCOSE 84 03/08/2021   BUN 9 03/08/2021   CREATININE 1.07 03/08/2021   BILITOT 0.5 11/21/2020   ALKPHOS 63 06/01/2020   AST 15 11/21/2020   ALT 10 11/21/2020   PROT 6.9 11/21/2020   ALBUMIN 4.8 06/01/2020   CALCIUM 9.8 03/08/2021   Lab Results  Component Value Date   CHOL 211 (H) 06/01/2020   Lab Results  Component Value Date   HDL 85 06/01/2020   Lab Results  Component Value Date   LDLCALC 114 (H) 06/01/2020   Lab Results  Component Value Date   TRIG 66 06/01/2020   Lab Results  Component Value Date   CHOLHDL 2.5 06/01/2020   Lab Results  Component Value Date   HGBA1C 5.2 11/21/2020      Assessment & Plan:   Problem List Items Addressed This Visit   None   No orders of the defined types were placed in this encounter.   Follow-up: No follow-ups on file.   1. Chronic midline low back pain with right-sided sciatica 2. Facet arthropathy, lumbar 3. Abnormality of gait Referral placed to start back with Mr. Ruffin Pyo. Encouraged him to keep using his cane and take medications as directed by Physical Med.   Francile Woolford M Naren Benally, PA-C

## 2021-06-25 NOTE — Patient Instructions (Addendum)
Referral for PT renewal placed today Try to get back with Sam for med check in the next few months You can discuss handicap placard with Physical Med and Rehab specialist

## 2021-07-24 DIAGNOSIS — M25561 Pain in right knee: Secondary | ICD-10-CM | POA: Diagnosis not present

## 2021-07-31 ENCOUNTER — Other Ambulatory Visit: Payer: Self-pay | Admitting: *Deleted

## 2021-08-01 DIAGNOSIS — M25561 Pain in right knee: Secondary | ICD-10-CM | POA: Diagnosis not present

## 2021-08-02 ENCOUNTER — Emergency Department (HOSPITAL_COMMUNITY): Payer: BC Managed Care – PPO

## 2021-08-02 ENCOUNTER — Emergency Department (HOSPITAL_COMMUNITY)
Admission: EM | Admit: 2021-08-02 | Discharge: 2021-08-02 | Disposition: A | Discharge status: Home / Self Care | Payer: BC Managed Care – PPO | Attending: Emergency Medicine | Admitting: Emergency Medicine

## 2021-08-02 ENCOUNTER — Other Ambulatory Visit: Payer: Self-pay

## 2021-08-02 ENCOUNTER — Encounter (HOSPITAL_COMMUNITY): Payer: Self-pay

## 2021-08-02 DIAGNOSIS — I10 Essential (primary) hypertension: Secondary | ICD-10-CM | POA: Diagnosis not present

## 2021-08-02 DIAGNOSIS — M542 Cervicalgia: Secondary | ICD-10-CM | POA: Diagnosis not present

## 2021-08-02 DIAGNOSIS — F17210 Nicotine dependence, cigarettes, uncomplicated: Secondary | ICD-10-CM | POA: Insufficient documentation

## 2021-08-02 DIAGNOSIS — W182XXA Fall in (into) shower or empty bathtub, initial encounter: Secondary | ICD-10-CM | POA: Diagnosis not present

## 2021-08-02 DIAGNOSIS — S0990XA Unspecified injury of head, initial encounter: Secondary | ICD-10-CM | POA: Diagnosis not present

## 2021-08-02 DIAGNOSIS — S161XXA Strain of muscle, fascia and tendon at neck level, initial encounter: Secondary | ICD-10-CM | POA: Diagnosis not present

## 2021-08-02 DIAGNOSIS — S060X9A Concussion with loss of consciousness of unspecified duration, initial encounter: Secondary | ICD-10-CM | POA: Diagnosis not present

## 2021-08-02 DIAGNOSIS — Y92002 Bathroom of unspecified non-institutional (private) residence single-family (private) house as the place of occurrence of the external cause: Secondary | ICD-10-CM | POA: Diagnosis not present

## 2021-08-02 LAB — BASIC METABOLIC PANEL
Anion gap: 8 (ref 5–15)
BUN: 6 mg/dL (ref 6–20)
CO2: 24 mmol/L (ref 22–32)
Calcium: 9.2 mg/dL (ref 8.9–10.3)
Chloride: 109 mmol/L (ref 98–111)
Creatinine, Ser: 0.98 mg/dL (ref 0.61–1.24)
GFR, Estimated: >60 mL/min (ref >60)
Glucose, Bld: 107 mg/dL — ABNORMAL HIGH (ref 70–99)
Potassium: 3.9 mmol/L (ref 3.5–5.1)
Sodium: 141 mmol/L (ref 135–145)

## 2021-08-02 LAB — CBC
HCT: 42.6 % (ref 39.0–52.0)
Hemoglobin: 13.7 g/dL (ref 13.0–17.0)
MCH: 28.5 pg (ref 26.0–34.0)
MCHC: 32.2 g/dL (ref 30.0–36.0)
MCV: 88.8 fL (ref 80.0–100.0)
Platelets: 333 10*3/uL (ref 150–400)
RBC: 4.8 MIL/uL (ref 4.22–5.81)
RDW: 16.7 % — ABNORMAL HIGH (ref 11.5–15.5)
WBC: 8 10*3/uL (ref 4.0–10.5)
nRBC: 0 % (ref 0.0–0.2)

## 2021-08-02 LAB — URINALYSIS, ROUTINE W REFLEX MICROSCOPIC
Bilirubin Urine: NEGATIVE
Glucose, UA: NEGATIVE mg/dL
Hgb urine dipstick: NEGATIVE
Ketones, ur: NEGATIVE mg/dL
Leukocytes,Ua: NEGATIVE
Nitrite: NEGATIVE
Protein, ur: NEGATIVE mg/dL
Specific Gravity, Urine: 1.005 (ref 1.005–1.030)
pH: 7 (ref 5.0–8.0)

## 2021-08-02 MED ORDER — MESALAMINE ER 0.375 G PO CP24: 375.0000 mg | ORAL_CAPSULE | Freq: Four times a day (QID) | ORAL | 3 refills | Status: DC

## 2021-08-02 MED ORDER — ACETAMINOPHEN 325 MG PO TABS
650.0000 mg | ORAL_TABLET | Freq: Once | ORAL | Status: AC
  Administered 2021-08-02: 650 mg via ORAL
  Filled 2021-08-02: qty 2

## 2021-08-02 MED ORDER — ONDANSETRON 8 MG PO TBDP: 8.0000 mg | ORAL_TABLET | Freq: Three times a day (TID) | ORAL | 0 refills | Status: DC | PRN

## 2021-08-02 MED ORDER — ONDANSETRON 4 MG PO TBDP
8.0000 mg | ORAL_TABLET | Freq: Once | ORAL | Status: AC
  Administered 2021-08-02: 8 mg via ORAL
  Filled 2021-08-02: qty 2

## 2021-08-02 NOTE — Discharge Instructions (Addendum)
Take over-the-counter medications as needed for pain.  Take the Zofran as needed for nausea.  Please review the discharge instructions.  Additional information.

## 2021-08-02 NOTE — ED Triage Notes (Signed)
Pt passed out in the shower last night, wife found pt in the bathroom after hearing a thud. Pt currently c.o frontal headache, light sensitivity and nausea. Pt denies any chest pain or abd pain. Pt a.o

## 2021-08-02 NOTE — ED Provider Notes (Signed)
[facility] EMERGENCY DEPARTMENT Provider Note   CSN: [id number] Arrival date & time: 08/02/21  1337     History Chief Complaint  Patient presents with   Loss of Consciousness    Daniel Marsh is a 40 y.o. male.   Loss of Consciousness  Patient states he was in the shower last evening.  He believes he must of slipped and fell.  His wife found him on the bathroom floor after hearing a loud sound.  Patient struck his head.  Patient does not have any specific memory of the fall.  Today he has had issues with persistent frontal headache.  He has had light sensitivity.  He is also had a couple episodes of nausea and vomiting.  He does have some neck discomfort but denies any focal numbness or weakness.  He denies any fevers or chills.  No chest pain or shortness of breath.  He does have some chronic back problems but denies any new injuries with that.  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   Hyperlipidemia 06/01/2020   PTSD (post-traumatic stress disorder)    Hypertension    Depression    Anxiety    Ulcerative colitis ([place]) 2019   Myocardial infarction ([place]) 2015    Past Surgical History:  Procedure Laterality Date   ANKLE SURGERY Right 2011   KNEE SURGERY Left 2020   SPINE SURGERY  2013   Lumbar fusion L4-S1   WISDOM TOOTH EXTRACTION         Family History  Problem Relation Age of Onset    Osteoarthritis Mother    Depression Mother    Heart attack Mother    Osteoarthritis Father    Depression Father    Depression Brother    Diabetes Maternal Grandmother    Hyperlipidemia Maternal Grandmother    Hypertension Maternal Grandmother    Heart attack Maternal Grandmother    Osteoarthritis Maternal Grandfather    COPD Maternal Grandfather    Kidney disease Maternal Grandfather    Renal cancer Maternal Grandfather    Alcohol abuse Paternal Grandmother    COPD Paternal Grandmother    Diabetes Paternal Grandmother    Hypertension Paternal Grandmother    Lung cancer Paternal Grandmother    Osteoarthritis Paternal Grandfather    Heart attack Paternal Grandfather    Heart failure Paternal Grandfather    Depression Brother    Pancreatic cancer Maternal Aunt    Lung cancer Paternal Aunt    Colon cancer Maternal Uncle     Social History   Tobacco Use   Smoking status: Some Days    Types: Cigarettes, Cigars   Smokeless tobacco: Current    Types: Chew  Vaping Use   Vaping Use: Never used  Substance Use Topics   Alcohol use: Yes    Alcohol/week: 13.0 standard drinks    Types: 10 Cans of beer, 3  Shots of liquor per week   Drug use: Yes    Types: Marijuana    Comment: occ    Home Medications Prior to Admission medications   Medication Sig Start Date End Date Taking? Authorizing Provider  amitriptyline (ELAVIL) 25 MG tablet Take 1 tablet (25 mg total) by mouth at bedtime. 05/17/21  Yes Bayard Hugger, NP  diazepam (VALIUM) 5 MG tablet Take 1 tablet (5 mg total) by mouth at bedtime. Patient taking differently: Take 5 mg by mouth at bedtime as needed for anxiety. 05/30/20  Yes Inda Coke, PA  dicyclomine (BENTYL) 10 MG capsule Take 1 capsule (10 mg total) by mouth 3 (three) times daily as needed (abdominal pain/ cramping). 01/17/21  Yes Esterwood, Amy S, PA-C  gabapentin (NEURONTIN) 600 MG tablet Take 2 tablets (1,200 mg total) by mouth 3 (three) times daily.  05/17/21  Yes Bayard Hugger, NP  mesalamine (APRISO) 0.375 g 24 hr capsule Take 1 capsule (0.375 g total) by mouth in the morning, at noon, in the evening, and at bedtime. 08/02/21 10/31/21 Yes Cirigliano, Vito V, DO  omeprazole (PRILOSEC) 40 MG capsule Take 1 capsule (40 mg total) by mouth daily. 07/09/20  Yes Esterwood, Amy S, PA-C  ondansetron (ZOFRAN ODT) 8 MG disintegrating tablet Take 1 tablet (8 mg total) by mouth every 8 (eight) hours as needed for nausea or vomiting. 08/02/21  Yes Dorie Rank, MD  prazosin (MINIPRESS) 1 MG capsule Take 2 capsules (2 mg total) by mouth at bedtime. 05/30/20 10/05/21 Yes Inda Coke, PA  vortioxetine HBr (TRINTELLIX) 10 MG TABS tablet Take 1 tablet (10 mg total) by mouth daily. Patient taking differently: Take 20 mg by mouth daily. 10/08/20  Yes Inda Coke, PA    Allergies    Patient has no known allergies.  Review of Systems   Review of Systems  Cardiovascular:  Positive for syncope.  All other systems reviewed and are negative.  Physical Exam Updated Vital Signs BP (!) 142/80   Pulse 65   Temp 98 F (36.7 C) (Oral)   Resp 18   Ht 1.829 m (6')   Wt 90.7 kg   SpO2 100%   BMI 27.12 kg/m   Physical Exam Vitals and nursing note reviewed.  Constitutional:      General: He is not in acute distress.    Appearance: He is well-developed.  HENT:     Head: Normocephalic and atraumatic.     Right Ear: External ear normal.     Left Ear: External ear normal.  Eyes:     General: No scleral icterus.       Right eye: No discharge.        Left eye: No discharge.     Conjunctiva/sclera: Conjunctivae normal.  Neck:     Trachea: No tracheal deviation.  Cardiovascular:     Rate and Rhythm: Normal rate and regular rhythm.  Pulmonary:     Effort: Pulmonary effort is normal. No respiratory distress.     Breath sounds: Normal breath sounds. No stridor. No wheezing or rales.  Abdominal:     General: Bowel sounds are normal. There is no distension.      Palpations: Abdomen is soft.     Tenderness: There is no abdominal tenderness. There is no guarding or rebound.  Musculoskeletal:        General: No deformity.     Cervical back: Neck supple. Tenderness present.     Thoracic back: No tenderness.  Lumbar back: No tenderness.  Skin:    General: Skin is warm and dry.     Findings: No rash.  Neurological:     General: No focal deficit present.     Mental Status: He is alert.     Cranial Nerves: No cranial nerve deficit (no facial droop, extraocular movements intact, no slurred speech).     Sensory: No sensory deficit.     Motor: No abnormal muscle tone or seizure activity.     Coordination: Coordination normal.  Psychiatric:        Mood and Affect: Mood normal.    ED Results / Procedures / Treatments   Labs (all labs ordered are listed, but only abnormal results are displayed) Labs Reviewed  BASIC METABOLIC PANEL - Abnormal; Notable for the following components:      Result Value   Glucose, Bld 107 (*)    All other components within normal limits  CBC - Abnormal; Notable for the following components:   RDW 16.7 (*)    All other components within normal limits  URINALYSIS, ROUTINE W REFLEX MICROSCOPIC - Abnormal; Notable for the following components:   Color, Urine STRAW (*)    All other components within normal limits  CBG MONITORING, ED    EKG EKG Interpretation  Date/Time:  Friday August 02 2021 14:06:03 EDT Ventricular Rate:  66 PR Interval:  132 QRS Duration: 84 QT Interval:  388 QTC Calculation: 406 R Axis:   65 Text Interpretation: Normal sinus rhythm Normal ECG No old tracing to compare Confirmed by Dorie Rank [phone]) on 08/02/2021 4:06:59 PM  Radiology CT HEAD WO CONTRAST (5MM)  Result Date: 08/02/2021 CLINICAL DATA:  Fall with head injury. Posterior neck pain. No loss of consciousness. EXAM: CT HEAD WITHOUT CONTRAST CT CERVICAL SPINE WITHOUT CONTRAST TECHNIQUE: Multidetector CT imaging of the head and  cervical spine was performed following the standard protocol without intravenous contrast. Multiplanar CT image reconstructions of the cervical spine were also generated. COMPARISON:  None. FINDINGS: CT HEAD FINDINGS Brain: There is no evidence of acute intracranial hemorrhage, mass lesion, brain edema or extra-axial fluid collection. The ventricles and subarachnoid spaces are appropriately sized for age. There is no CT evidence of acute cortical infarction. Vascular:  No hyperdense vessel identified. Skull: Negative for fracture or focal lesion. Sinuses/Orbits: The visualized paranasal sinuses and mastoid air cells are clear. No orbital abnormalities are seen. Other: None. CT CERVICAL SPINE FINDINGS Alignment: Normal. Skull base and vertebrae: No evidence of acute fracture or traumatic subluxation. Soft tissues and spinal canal: No prevertebral fluid or swelling. No visible canal hematoma. Disc levels: The disc heights are maintained at each level. No large disc herniation or significant spinal stenosis identified. Upper chest: Unremarkable. Other: None. IMPRESSION: 1. No acute intracranial or calvarial findings. 2. No evidence of acute cervical spine fracture or traumatic subluxation. No significant spondylosis. Electronically Signed   By: Richardean Sale M.D.   On: 08/02/2021 16:59   CT Cervical Spine Wo Contrast  Result Date: 08/02/2021 CLINICAL DATA:  Fall with head injury. Posterior neck pain. No loss of consciousness. EXAM: CT HEAD WITHOUT CONTRAST CT CERVICAL SPINE WITHOUT CONTRAST TECHNIQUE: Multidetector CT imaging of the head and cervical spine was performed following the standard protocol without intravenous contrast. Multiplanar CT image reconstructions of the cervical spine were also generated. COMPARISON:  None. FINDINGS: CT HEAD FINDINGS Brain: There is no evidence of acute intracranial hemorrhage, mass lesion, brain edema or extra-axial fluid collection. The ventricles and subarachnoid spaces  are  appropriately sized for age. There is no CT evidence of acute cortical infarction. Vascular:  No hyperdense vessel identified. Skull: Negative for fracture or focal lesion. Sinuses/Orbits: The visualized paranasal sinuses and mastoid air cells are clear. No orbital abnormalities are seen. Other: None. CT CERVICAL SPINE FINDINGS Alignment: Normal. Skull base and vertebrae: No evidence of acute fracture or traumatic subluxation. Soft tissues and spinal canal: No prevertebral fluid or swelling. No visible canal hematoma. Disc levels: The disc heights are maintained at each level. No large disc herniation or significant spinal stenosis identified. Upper chest: Unremarkable. Other: None. IMPRESSION: 1. No acute intracranial or calvarial findings. 2. No evidence of acute cervical spine fracture or traumatic subluxation. No significant spondylosis. Electronically Signed   By: Richardean Sale M.D.   On: 08/02/2021 16:59    Procedures Procedures   Medications Ordered in ED Medications  acetaminophen (TYLENOL) tablet 650 mg (650 mg Oral Given 08/02/21 1558)  ondansetron (ZOFRAN-ODT) disintegrating tablet 8 mg (8 mg Oral Given 08/02/21 1614)    ED Course  I have reviewed the triage vital signs and the nursing notes.  Pertinent labs & imaging results that were available during my care of the patient were reviewed by me and considered in my medical decision making (see chart for details).  Clinical Course as of 08/04/21 1312  Fri Aug 02, 2021  1605 Labs notable for normal CBC.  Metabolic panel normal.  UA normal [JK]  1720 Head and C-spine CT without acute findings [JK]    Clinical Course User Index [JK] Dorie Rank, MD   MDM Rules/Calculators/A&P                           Patient presented to the ED for evaluation after an episode of loss of consciousness.  Unclear if the patient slipped and fell but it sounds like he may have hitting his head.  ED work-up is otherwise reassuring.  No signs of anemia or  acute electrolyte abnormalities.  No cardiac dysrhythmia noted.  Suspect patient may have slipped and had a concussion.  No signs of serious injuries on CT scans.  Appears stable for discharge.  Final Clinical Impression(s) / ED Diagnoses Final diagnoses:  Concussion with loss of consciousness, initial encounter  Strain of neck muscle, initial encounter    Rx / DC Orders ED Discharge Orders          Ordered    ondansetron (ZOFRAN ODT) 8 MG disintegrating tablet  Every 8 hours PRN        08/02/21 1732             Dorie Rank, MD 08/04/21 1314

## 2021-08-02 NOTE — ED Provider Notes (Signed)
Emergency Medicine Provider Triage Evaluation Note  Daniel Marsh , a 40 y.o. male  was evaluated in triage.  Pt complains of head injury last night. Patient was in shower and slipped and fell, hitting his head. Has no memory of the incident until waking up this morning. Symptoms getting progressively worse. Does not take blood thinners.   Review of Systems  Positive: Headache, photophobia, nausea, vomiting, weakness Negative: Chest pain, SOB, abdominal pain, numbness, tingling  Physical Exam  BP (!) 158/88 (BP Location: Right Arm)   Pulse 82   Temp 98.2 F (36.8 C) (Oral)   Resp 16   SpO2 96%  Gen:   Awake, no distress   Resp:  Normal effort  MSK:   Moves extremities without difficulty  Other:  Head atraumatic  Medical Decision Making  Medically screening exam initiated at 2:00 PM.  Appropriate orders placed.  Daniel Marsh was informed that the remainder of the evaluation will be completed by another provider, this initial triage assessment does not replace that evaluation, and the importance of remaining in the ED until their evaluation is complete.     Estill Cotta 08/02/21 1406    Wyvonnia Dusky, MD 08/02/21 [phone]

## 2021-08-13 ENCOUNTER — Other Ambulatory Visit: Payer: Self-pay

## 2021-08-13 ENCOUNTER — Encounter: Payer: Self-pay | Admitting: Registered Nurse

## 2021-08-13 ENCOUNTER — Encounter: Payer: BC Managed Care – PPO | Attending: Physical Medicine & Rehabilitation | Admitting: Registered Nurse

## 2021-08-13 VITALS — BP 132/84 | HR 64 | Temp 98.8°F | Ht 72.0 in | Wt 204.0 lb

## 2021-08-13 DIAGNOSIS — D1803 Hemangioma of intra-abdominal structures: Secondary | ICD-10-CM | POA: Diagnosis not present

## 2021-08-13 DIAGNOSIS — G8929 Other chronic pain: Secondary | ICD-10-CM

## 2021-08-13 DIAGNOSIS — M47816 Spondylosis without myelopathy or radiculopathy, lumbar region: Secondary | ICD-10-CM | POA: Diagnosis not present

## 2021-08-13 DIAGNOSIS — M25561 Pain in right knee: Secondary | ICD-10-CM

## 2021-08-13 DIAGNOSIS — M25562 Pain in left knee: Secondary | ICD-10-CM | POA: Diagnosis not present

## 2021-08-13 DIAGNOSIS — M961 Postlaminectomy syndrome, not elsewhere classified: Secondary | ICD-10-CM | POA: Diagnosis not present

## 2021-08-13 DIAGNOSIS — G479 Sleep disorder, unspecified: Secondary | ICD-10-CM

## 2021-08-13 DIAGNOSIS — M5416 Radiculopathy, lumbar region: Secondary | ICD-10-CM | POA: Diagnosis not present

## 2021-08-13 DIAGNOSIS — R269 Unspecified abnormalities of gait and mobility: Secondary | ICD-10-CM

## 2021-08-13 MED ORDER — AMITRIPTYLINE HCL 25 MG PO TABS: 25.0000 mg | ORAL_TABLET | Freq: Every day | ORAL | 5 refills | Status: DC

## 2021-08-13 MED ORDER — GABAPENTIN 600 MG PO TABS: 1200.0000 mg | ORAL_TABLET | Freq: Three times a day (TID) | ORAL | 5 refills | Status: DC

## 2021-08-13 NOTE — Progress Notes (Signed)
Subjective:    Patient ID: Daniel Marsh, male    DOB: 1981/07/27, 40 y.o.   MRN: [id number]  HPI: Daniel Marsh is a 40 y.o. male who returns for follow up appointment for chronic pain and medication refill. He states his pain is located in his lower back radiating into his left lower extremity and bilateral knee pain. He rates his pain 8. His current exercise regime is walking and performing stretching exercises.  He also reports Ortho following his bilateral knee pain.   He went to [facility] ED on 08/02/2021, note was reviewed.      Pain Inventory Average Pain 8 Pain Right Now 8 My pain is constant, sharp, burning, dull, stabbing, tingling, and aching  In the last 24 hours, has pain interfered with the following? General activity 7 Relation with others 7 Enjoyment of life 7 What TIME of day is your pain at its worst? morning , daytime, evening, and night Sleep (in general) Poor  Pain is worse with: walking, bending, sitting, inactivity, standing, and some activites Pain improves with: TENS and exercise Relief from Meds: 0  Family History  Problem Relation Age of Onset   Osteoarthritis Mother    Depression Mother    Heart attack Mother    Osteoarthritis Father    Depression Father    Depression Brother    Diabetes Maternal Grandmother    Hyperlipidemia Maternal Grandmother    Hypertension Maternal Grandmother    Heart attack Maternal Grandmother    Osteoarthritis Maternal Grandfather    COPD Maternal Grandfather    Kidney disease Maternal Grandfather    Renal cancer Maternal Grandfather    Alcohol abuse Paternal Grandmother    COPD Paternal Grandmother    Diabetes Paternal Grandmother    Hypertension Paternal Grandmother    Lung cancer Paternal Grandmother    Osteoarthritis Paternal Grandfather    Heart attack Paternal Grandfather    Heart failure Paternal Grandfather    Depression Brother    Pancreatic cancer Maternal Aunt    Lung cancer  Paternal Aunt    Colon cancer Maternal Uncle    Social History   Socioeconomic History   Marital status: Married    Spouse name: Not on file   Number of children: 3   Years of education: Not on file   Highest education level: Not on file  Occupational History   Not on file  Tobacco Use   Smoking status: Some Days    Types: Cigarettes, Cigars   Smokeless tobacco: Current    Types: Chew  Vaping Use   Vaping Use: Never used  Substance and Sexual Activity   Alcohol use: Yes    Alcohol/week: 13.0 standard drinks    Types: 10 Cans of beer, 3 Shots of liquor per week   Drug use: Yes    Types: Marijuana    Comment: occ   Sexual activity: Yes  Other Topics Concern   Not on file  Social History Narrative   ** Merged History Encounter **       Was in the [employer] for 7 years, required lumbar surgery while in the [employer] and had poor outcome Divorced and re-married 43 yo son    Social Determinants of Radio broadcast assistant Strain: Not on file  Food Insecurity: Not on file  Transportation Needs: Not on file  Physical Activity: Not on file  Stress: Not on file  Social Connections: Not on file   Past Surgical History:  Procedure Laterality Date  ANKLE SURGERY Right 2011   KNEE SURGERY Left 2020   SPINE SURGERY  2013   Lumbar fusion L4-S1   WISDOM TOOTH EXTRACTION     Past Surgical History:  Procedure Laterality Date   ANKLE SURGERY Right 2011   KNEE SURGERY Left 2020   SPINE SURGERY  2013   Lumbar fusion L4-S1   WISDOM TOOTH EXTRACTION     Past Medical History:  Diagnosis Date   Anxiety    Arthritis    Depression    Hypertension    Myocardial infarction [facility])    PTSD (post-traumatic stress disorder)    Ulcerative colitis ([place]) 2019   BP 132/84   Pulse 64   Temp 98.8 F (37.1 C)   Ht 6' (1.829 m)   Wt 204 lb (92.5 kg)   SpO2 98%   BMI 27.67 kg/m   Opioid Risk Score:   Fall Risk Score:  `1  Depression screen PHQ 2/9  Depression screen [facility]  2/9 08/13/2021 05/17/2021 03/19/2021 11/05/2020 10/08/2020 05/30/2020  Decreased Interest 1 1 1 3 3 2   Down, Depressed, Hopeless 1 1 1 3 3 3   PHQ - 2 Score 2 2 2 6 6 5   Altered sleeping - - 1 3 1 3   Tired, decreased energy - - - 3 3 2   Change in appetite - - - 3 3 3   Feeling bad or failure about yourself  - - - 3 3 2   Trouble concentrating - - - 3 3 2   Moving slowly or fidgety/restless - - - 2 2 2   Suicidal thoughts - - - 1 1 1   PHQ-9 Score - - 3 24 22 20   Difficult doing work/chores - - - Very difficult Very difficult Somewhat difficult    Review of Systems  Musculoskeletal:  Positive for back pain and gait problem.       Pain in the : right shoulder, both hips, both knees & left leg  All other systems reviewed and are negative.     Objective:   Physical Exam Vitals and nursing note reviewed.  Constitutional:      Appearance: Normal appearance.  Cardiovascular:     Rate and Rhythm: Normal rate and regular rhythm.     Pulses: Normal pulses.     Heart sounds: Normal heart sounds.  Pulmonary:     Effort: Pulmonary effort is normal.     Breath sounds: Normal breath sounds.  Musculoskeletal:     Cervical back: Normal range of motion and neck supple.     Comments: Normal Muscle Bulk and Muscle Testing Reveals:  Upper Extremities: Full ROM and Muscle Strength 5/5  Lower Extremities : Right: Decreased ROM and Muscle Strength 5/5  Right Lower Extremity Flexion Produces Pain into his Right Patella Left Lower Extremity: Full ROM and Muscle Strength 5/5 Arises from Table slowly using cane for support Antalgic Gait     Skin:    General: Skin is warm and dry.  Neurological:     Mental Status: He is alert and oriented to person, place, and time.  Psychiatric:        Mood and Affect: Mood normal.        Behavior: Behavior normal.         Assessment & Plan:  Failed Back Syndrome: Continue HEP as Tolerated. Continue current medication regimen. Continue to monitor. 08/13/2021 Facet  Arthropathy, Lumbar: Continue HEP as Tolerated. Continue current medication regimen. Continue to monitor. 08/13/2021 Left Lumbar Radiculitis: Continue Gabapentin. Continue to  Monitor. 08/13/2021 Abnormality of Gait: Continue use cane. Continue to monitor. 08/13/2021 Chronic Bilateral Knee pain: L>R: Orthopedist Following  Continue to monitor 08/13/2021 Chronic Left Hip Pain: No complaints today. Continue to Monitor. 08/13/2021 Sleep Disturbance: Continue Elavil at H. Continue to Monitor. 08/13/2021  F/U in 6 months

## 2021-08-13 NOTE — Progress Notes (Deleted)
Subjective:    Patient ID: Daniel Marsh, male    DOB: 1981/01/01, 40 y.o.   MRN: [id number]  HPI   Pain Inventory Average Pain 8 Pain Right Now 8 My pain is constant, sharp, burning, dull, stabbing, tingling, and aching  In the last 24 hours, has pain interfered with the following? General activity 7 Relation with others 7 Enjoyment of life 7 What TIME of day is your pain at its worst? morning , daytime, evening, and night Sleep (in general) Poor  Pain is worse with: walking, bending, sitting, inactivity, standing, and some activites Pain improves with: TENS and exercise Relief from Meds: 0  Family History  Problem Relation Age of Onset   Osteoarthritis Mother    Depression Mother    Heart attack Mother    Osteoarthritis Father    Depression Father    Depression Brother    Diabetes Maternal Grandmother    Hyperlipidemia Maternal Grandmother    Hypertension Maternal Grandmother    Heart attack Maternal Grandmother    Osteoarthritis Maternal Grandfather    COPD Maternal Grandfather    Kidney disease Maternal Grandfather    Renal cancer Maternal Grandfather    Alcohol abuse Paternal Grandmother    COPD Paternal Grandmother    Diabetes Paternal Grandmother    Hypertension Paternal Grandmother    Lung cancer Paternal Grandmother    Osteoarthritis Paternal Grandfather    Heart attack Paternal Grandfather    Heart failure Paternal Grandfather    Depression Brother    Pancreatic cancer Maternal Aunt    Lung cancer Paternal Aunt    Colon cancer Maternal Uncle    Social History   Socioeconomic History   Marital status: Married    Spouse name: Not on file   Number of children: 3   Years of education: Not on file   Highest education level: Not on file  Occupational History   Not on file  Tobacco Use   Smoking status: Some Days    Types: Cigarettes, Cigars   Smokeless tobacco: Current    Types: Chew  Vaping Use   Vaping Use: Never used  Substance and  Sexual Activity   Alcohol use: Yes    Alcohol/week: 13.0 standard drinks    Types: 10 Cans of beer, 3 Shots of liquor per week   Drug use: Yes    Types: Marijuana    Comment: occ   Sexual activity: Yes  Other Topics Concern   Not on file  Social History Narrative   ** Merged History Encounter **       Was in the [employer] for 7 years, required lumbar surgery while in the [employer] and had poor outcome Divorced and re-married 2 yo son    Social Determinants of Health   Financial Resource Strain: Not on file  Food Insecurity: Not on file  Transportation Needs: Not on file  Physical Activity: Not on file  Stress: Not on file  Social Connections: Not on file   Past Surgical History:  Procedure Laterality Date   ANKLE SURGERY Right 2011   KNEE SURGERY Left 2020   SPINE SURGERY  2013   Lumbar fusion L4-S1   WISDOM TOOTH EXTRACTION     Past Surgical History:  Procedure Laterality Date   ANKLE SURGERY Right 2011   KNEE SURGERY Left 2020   SPINE SURGERY  2013   Lumbar fusion L4-S1   WISDOM TOOTH EXTRACTION     Past Medical History:  Diagnosis Date   Anxiety  Arthritis    Depression    Hypertension    Myocardial infarction [facility])    PTSD (post-traumatic stress disorder)    Ulcerative colitis ([place]) 2019   BP 132/84   Pulse 64   Temp 98.8 F (37.1 C)   Ht 6' (1.829 m)   Wt 204 lb (92.5 kg)   SpO2 98%   BMI 27.67 kg/m   Opioid Risk Score:   Fall Risk Score:  `1  Depression screen PHQ 2/9  Depression screen [facility] 2/9 08/13/2021 05/17/2021 03/19/2021 11/05/2020 10/08/2020 05/30/2020  Decreased Interest 1 1 1 3 3 2   Down, Depressed, Hopeless 1 1 1 3 3 3   PHQ - 2 Score 2 2 2 6 6 5   Altered sleeping - - 1 3 1 3   Tired, decreased energy - - - 3 3 2   Change in appetite - - - 3 3 3   Feeling bad or failure about yourself  - - - 3 3 2   Trouble concentrating - - - 3 3 2   Moving slowly or fidgety/restless - - - 2 2 2   Suicidal thoughts - - - 1 1 1   PHQ-9 Score - - 3 24 22  20   Difficult doing work/chores - - - Very difficult Very difficult Somewhat difficult    Review of Systems  Musculoskeletal:  Positive for back pain and gait problem.       Pain in: Both knees, left leg, both hips & right shoulder  All other systems reviewed and are negative.     Objective:   Physical Exam        Assessment & Plan:

## 2021-08-14 ENCOUNTER — Other Ambulatory Visit: Payer: Self-pay | Admitting: Nurse Practitioner

## 2021-08-14 DIAGNOSIS — D1803 Hemangioma of intra-abdominal structures: Secondary | ICD-10-CM

## 2021-08-15 DIAGNOSIS — M25561 Pain in right knee: Secondary | ICD-10-CM | POA: Diagnosis not present

## 2021-08-20 DIAGNOSIS — M25561 Pain in right knee: Secondary | ICD-10-CM | POA: Diagnosis not present

## 2021-08-26 DIAGNOSIS — M25561 Pain in right knee: Secondary | ICD-10-CM | POA: Diagnosis not present

## 2021-09-05 ENCOUNTER — Ambulatory Visit
Admission: RE | Admit: 2021-09-05 | Discharge: 2021-09-05 | Disposition: A | Discharge status: Home / Self Care | Payer: BC Managed Care – PPO | Source: Ambulatory Visit | Attending: Nurse Practitioner | Admitting: Nurse Practitioner

## 2021-09-05 ENCOUNTER — Other Ambulatory Visit: Payer: Self-pay

## 2021-09-05 DIAGNOSIS — D1809 Hemangioma of other sites: Secondary | ICD-10-CM | POA: Diagnosis not present

## 2021-09-05 DIAGNOSIS — K7689 Other specified diseases of liver: Secondary | ICD-10-CM | POA: Diagnosis not present

## 2021-09-05 DIAGNOSIS — D1803 Hemangioma of intra-abdominal structures: Secondary | ICD-10-CM

## 2021-09-05 MED ORDER — GADOBENATE DIMEGLUMINE 529 MG/ML IV SOLN
19.0000 mL | Freq: Once | INTRAVENOUS | Status: AC | PRN
  Administered 2021-09-05: 19 mL via INTRAVENOUS

## 2021-09-25 ENCOUNTER — Encounter: Payer: Self-pay | Admitting: Physician Assistant

## 2021-09-25 ENCOUNTER — Encounter (HOSPITAL_BASED_OUTPATIENT_CLINIC_OR_DEPARTMENT_OTHER): Payer: Self-pay | Admitting: Obstetrics and Gynecology

## 2021-09-25 ENCOUNTER — Emergency Department (HOSPITAL_BASED_OUTPATIENT_CLINIC_OR_DEPARTMENT_OTHER)
Admission: EM | Admit: 2021-09-25 | Discharge: 2021-09-25 | Disposition: A | Discharge status: Home / Self Care | Payer: BC Managed Care – PPO | Attending: Emergency Medicine | Admitting: Emergency Medicine

## 2021-09-25 ENCOUNTER — Other Ambulatory Visit: Payer: Self-pay

## 2021-09-25 ENCOUNTER — Emergency Department (HOSPITAL_BASED_OUTPATIENT_CLINIC_OR_DEPARTMENT_OTHER): Payer: BC Managed Care – PPO

## 2021-09-25 ENCOUNTER — Telehealth: Payer: BC Managed Care – PPO | Admitting: Physician Assistant

## 2021-09-25 VITALS — Ht 72.0 in | Wt 204.0 lb

## 2021-09-25 DIAGNOSIS — R109 Unspecified abdominal pain: Secondary | ICD-10-CM | POA: Insufficient documentation

## 2021-09-25 DIAGNOSIS — I252 Old myocardial infarction: Secondary | ICD-10-CM | POA: Diagnosis not present

## 2021-09-25 DIAGNOSIS — Z8719 Personal history of other diseases of the digestive system: Secondary | ICD-10-CM | POA: Diagnosis not present

## 2021-09-25 DIAGNOSIS — F17210 Nicotine dependence, cigarettes, uncomplicated: Secondary | ICD-10-CM | POA: Diagnosis not present

## 2021-09-25 DIAGNOSIS — I10 Essential (primary) hypertension: Secondary | ICD-10-CM | POA: Diagnosis not present

## 2021-09-25 DIAGNOSIS — R112 Nausea with vomiting, unspecified: Secondary | ICD-10-CM | POA: Diagnosis not present

## 2021-09-25 DIAGNOSIS — R197 Diarrhea, unspecified: Secondary | ICD-10-CM

## 2021-09-25 DIAGNOSIS — K625 Hemorrhage of anus and rectum: Secondary | ICD-10-CM | POA: Diagnosis not present

## 2021-09-25 DIAGNOSIS — R1031 Right lower quadrant pain: Secondary | ICD-10-CM | POA: Diagnosis not present

## 2021-09-25 HISTORY — DX: Liver disease, unspecified: K76.9

## 2021-09-25 LAB — COMPREHENSIVE METABOLIC PANEL
ALT: 10 U/L (ref 0–44)
AST: 16 U/L (ref 15–41)
Albumin: 4.4 g/dL (ref 3.5–5.0)
Alkaline Phosphatase: 49 U/L (ref 38–126)
Anion gap: 7 (ref 5–15)
BUN: 11 mg/dL (ref 6–20)
CO2: 27 mmol/L (ref 22–32)
Calcium: 9 mg/dL (ref 8.9–10.3)
Chloride: 106 mmol/L (ref 98–111)
Creatinine, Ser: 0.98 mg/dL (ref 0.61–1.24)
GFR, Estimated: >60 mL/min (ref >60)
Glucose, Bld: 88 mg/dL (ref 70–99)
Potassium: 4.2 mmol/L (ref 3.5–5.1)
Sodium: 140 mmol/L (ref 135–145)
Total Bilirubin: 0.5 mg/dL (ref 0.3–1.2)
Total Protein: 6.9 g/dL (ref 6.5–8.1)

## 2021-09-25 LAB — URINALYSIS, ROUTINE W REFLEX MICROSCOPIC
Bilirubin Urine: NEGATIVE
Glucose, UA: NEGATIVE mg/dL
Hgb urine dipstick: NEGATIVE
Ketones, ur: NEGATIVE mg/dL
Nitrite: NEGATIVE
Specific Gravity, Urine: 1.025 (ref 1.005–1.030)
pH: 5.5 (ref 5.0–8.0)

## 2021-09-25 LAB — CBC
HCT: 37.9 % — ABNORMAL LOW (ref 39.0–52.0)
Hemoglobin: 12.2 g/dL — ABNORMAL LOW (ref 13.0–17.0)
MCH: 28 pg (ref 26.0–34.0)
MCHC: 32.2 g/dL (ref 30.0–36.0)
MCV: 86.9 fL (ref 80.0–100.0)
Platelets: 336 10*3/uL (ref 150–400)
RBC: 4.36 MIL/uL (ref 4.22–5.81)
RDW: 15.6 % — ABNORMAL HIGH (ref 11.5–15.5)
WBC: 6 10*3/uL (ref 4.0–10.5)
nRBC: 0 % (ref 0.0–0.2)

## 2021-09-25 LAB — LIPASE, BLOOD: Lipase: 61 U/L — ABNORMAL HIGH (ref 11–51)

## 2021-09-25 MED ORDER — HYDROMORPHONE HCL 1 MG/ML IJ SOLN
1.0000 mg | Freq: Once | INTRAMUSCULAR | Status: AC
  Administered 2021-09-25: 1 mg via INTRAVENOUS
  Filled 2021-09-25: qty 1

## 2021-09-25 MED ORDER — LACTATED RINGERS IV BOLUS
1000.0000 mL | Freq: Once | INTRAVENOUS | Status: AC
  Administered 2021-09-25: 1000 mL via INTRAVENOUS

## 2021-09-25 MED ORDER — PREDNISONE 10 MG (21) PO TBPK: ORAL_TABLET | Freq: Every day | ORAL | 0 refills | Status: DC

## 2021-09-25 MED ORDER — ONDANSETRON HCL 4 MG/2ML IJ SOLN
4.0000 mg | Freq: Once | INTRAMUSCULAR | Status: AC
  Administered 2021-09-25: 4 mg via INTRAVENOUS
  Filled 2021-09-25: qty 2

## 2021-09-25 MED ORDER — ONDANSETRON 4 MG PO TBDP: 4.0000 mg | ORAL_TABLET | Freq: Three times a day (TID) | ORAL | 0 refills | Status: AC | PRN

## 2021-09-25 MED ORDER — IOHEXOL 300 MG/ML  SOLN
100.0000 mL | Freq: Once | INTRAMUSCULAR | Status: AC | PRN
  Administered 2021-09-25: 100 mL via INTRAVENOUS

## 2021-09-25 MED ORDER — PREDNISONE 50 MG PO TABS
60.0000 mg | ORAL_TABLET | Freq: Once | ORAL | Status: AC
  Administered 2021-09-25: 60 mg via ORAL
  Filled 2021-09-25: qty 1

## 2021-09-25 NOTE — Progress Notes (Signed)
Upon patient arrival, he stated that he was having fever, chills, diarrhea.  The following information was obtained from LPN, Anselmo Pickler  Pt is c/o chills, body aches, fever off and on.  He is also having diarrhea with blood x 6-7 weeks and now past 2 days just going blood.   Told pt needs to go to ED now due to bleeding per Aldona Bar, gave him address for ED at [facility]. Pt verbalized understanding.  Patient was not seen by me.  No charge.

## 2021-09-25 NOTE — ED Provider Notes (Signed)
[facility] EMERGENCY DEPT Provider Note   CSN: [id number] Arrival date & time: 09/25/21  1543     History Chief Complaint  Patient presents with   Rectal Bleeding    Daniel Marsh is a 40 y.o. male.   Rectal Bleeding Associated symptoms: abdominal pain, fever and vomiting   Associated symptoms: no dizziness and no light-headedness   Patient with history of UC and hemorrhoids s/p banding, presents for abdominal pain, bloody stool, fatigue, and subjective fevers for the past 6 weeks.  He is followed by [facility].  Currently, he takes Apriso for his UC.  He has had some intermittent nausea and did have some emesis yesterday.  He has had decreased p.o. intake.  He has had pain in the area of his rectum which he states is baseline for him.    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   Hyperlipidemia 06/01/2020   PTSD (post-traumatic stress disorder)    Hypertension    Depression    Anxiety    Ulcerative colitis ([place]) 2019   Myocardial infarction ([place]) 2015    Past Surgical History:  Procedure Laterality Date   ANKLE SURGERY Right 2011   KNEE SURGERY Left 2020   SPINE SURGERY  2013   Lumbar fusion L4-S1   WISDOM TOOTH EXTRACTION         Family History  Problem Relation Age of Onset   Osteoarthritis Mother    Depression Mother    Heart attack Mother    Osteoarthritis Father    Depression Father    Depression  Brother    Diabetes Maternal Grandmother    Hyperlipidemia Maternal Grandmother    Hypertension Maternal Grandmother    Heart attack Maternal Grandmother    Osteoarthritis Maternal Grandfather    COPD Maternal Grandfather    Kidney disease Maternal Grandfather    Renal cancer Maternal Grandfather    Alcohol abuse Paternal Grandmother    COPD Paternal Grandmother    Diabetes Paternal Grandmother    Hypertension Paternal Grandmother    Lung cancer Paternal Grandmother    Osteoarthritis Paternal Grandfather    Heart attack Paternal Grandfather    Heart failure Paternal Grandfather    Depression Brother    Pancreatic cancer Maternal Aunt    Lung cancer Paternal Aunt    Colon cancer Maternal Uncle     Social History   Tobacco Use   Smoking status: Some Days    Types: Cigarettes, Cigars   Smokeless tobacco: Current    Types: Chew  Vaping Use   Vaping Use: Never used  Substance Use Topics   Alcohol use: Yes    Alcohol/week: 13.0 standard drinks    Types: 10 Cans of beer, 3 Shots of liquor per week   Drug use: Yes    Types: Marijuana    Comment: occ    Home Medications Prior to Admission medications   Medication  Sig Start Date End Date Taking? Authorizing Provider  ondansetron (ZOFRAN ODT) 4 MG disintegrating tablet Take 1 tablet (4 mg total) by mouth every 8 (eight) hours as needed for nausea or vomiting. 09/25/21  Yes Godfrey Pick, MD  predniSONE (STERAPRED UNI-PAK 21 TAB) 10 MG (21) TBPK tablet Take by mouth daily. Take 6 tabs by mouth daily  for 2 days, then 5 tabs for 2 days, then 4 tabs for 2 days, then 3 tabs for 2 days, 2 tabs for 2 days, then 1 tab by mouth daily for 2 days 09/25/21  Yes Godfrey Pick, MD  amitriptyline (ELAVIL) 25 MG tablet Take 1 tablet (25 mg total) by mouth at bedtime. 08/13/21   Bayard Hugger, NP  diazepam (VALIUM) 5 MG tablet Take 1 tablet (5 mg total) by mouth at bedtime. Patient taking differently: Take 5 mg by mouth at bedtime as needed for  anxiety. 05/30/20   Inda Coke, PA  dicyclomine (BENTYL) 10 MG capsule Take 1 capsule (10 mg total) by mouth 3 (three) times daily as needed (abdominal pain/ cramping). 01/17/21   Esterwood, Amy S, PA-C  gabapentin (NEURONTIN) 600 MG tablet Take 2 tablets (1,200 mg total) by mouth 3 (three) times daily. 08/13/21   Bayard Hugger, NP  mesalamine (APRISO) 0.375 g 24 hr capsule Take 1 capsule (0.375 g total) by mouth in the morning, at noon, in the evening, and at bedtime. 08/02/21 10/31/21  Cirigliano, Vito V, DO  omeprazole (PRILOSEC) 40 MG capsule Take 1 capsule (40 mg total) by mouth daily. 07/09/20   Esterwood, Amy S, PA-C  prazosin (MINIPRESS) 1 MG capsule Take 2 capsules (2 mg total) by mouth at bedtime. 05/30/20 10/05/21  Inda Coke, PA  vortioxetine HBr (TRINTELLIX) 10 MG TABS tablet Take 1 tablet (10 mg total) by mouth daily. Patient taking differently: Take 20 mg by mouth daily. 10/08/20   Inda Coke, PA    Allergies    Patient has no known allergies.  Review of Systems   Review of Systems  Constitutional:  Positive for activity change, appetite change, fatigue and fever.  HENT:  Negative for ear pain and sore throat.   Eyes:  Negative for pain and visual disturbance.  Respiratory:  Negative for cough, shortness of breath and wheezing.   Cardiovascular:  Negative for chest pain and palpitations.  Gastrointestinal:  Positive for abdominal pain, blood in stool, diarrhea, hematochezia, nausea and vomiting.  Genitourinary:  Negative for dysuria and hematuria.  Musculoskeletal:  Positive for back pain. Negative for arthralgias, joint swelling, myalgias and neck pain.  Skin:  Negative for color change and rash.  Neurological:  Negative for dizziness, seizures, syncope, weakness, light-headedness, numbness and headaches.  Hematological:  Does not bruise/bleed easily.  Psychiatric/Behavioral:  Negative for confusion and decreased concentration.   All other systems reviewed and are  negative.  Physical Exam Updated Vital Signs BP 139/68 (BP Location: Right Arm)   Pulse (!) 59   Temp 99.2 F (37.3 C) (Oral)   Resp 17   Ht 6' (1.829 m)   Wt 93.4 kg   SpO2 100%   BMI 27.94 kg/m   Physical Exam Vitals and nursing note reviewed.  Constitutional:      General: He is not in acute distress.    Appearance: Normal appearance. He is well-developed and normal weight. He is not ill-appearing, toxic-appearing or diaphoretic.  HENT:     Head: Normocephalic and atraumatic.     Right Ear: External ear normal.  Left Ear: External ear normal.     Mouth/Throat:     Mouth: Mucous membranes are moist.     Pharynx: Oropharynx is clear.  Eyes:     General: No scleral icterus.    Conjunctiva/sclera: Conjunctivae normal.  Cardiovascular:     Rate and Rhythm: Normal rate and regular rhythm.     Heart sounds: No murmur heard. Pulmonary:     Effort: Pulmonary effort is normal. No respiratory distress.     Breath sounds: Normal breath sounds.  Chest:     Chest wall: No tenderness.  Abdominal:     Palpations: Abdomen is soft.     Tenderness: There is abdominal tenderness.  Musculoskeletal:        General: Normal range of motion.     Cervical back: Normal range of motion and neck supple.     Right lower leg: No edema.     Left lower leg: No edema.  Skin:    General: Skin is warm and dry.     Coloration: Skin is not jaundiced or pale.  Neurological:     General: No focal deficit present.     Mental Status: He is alert and oriented to person, place, and time.     Cranial Nerves: No cranial nerve deficit.     Sensory: No sensory deficit.     Motor: No weakness.  Psychiatric:        Mood and Affect: Mood normal.        Behavior: Behavior normal.        Thought Content: Thought content normal.        Judgment: Judgment normal.    ED Results / Procedures / Treatments   Labs (all labs ordered are listed, but only abnormal results are displayed) Labs Reviewed   LIPASE, BLOOD - Abnormal; Notable for the following components:      Result Value   Lipase 61 (*)    All other components within normal limits  CBC - Abnormal; Notable for the following components:   Hemoglobin 12.2 (*)    HCT 37.9 (*)    RDW 15.6 (*)    All other components within normal limits  URINALYSIS, ROUTINE W REFLEX MICROSCOPIC - Abnormal; Notable for the following components:   Protein, ur TRACE (*)    Leukocytes,Ua SMALL (*)    All other components within normal limits  COMPREHENSIVE METABOLIC PANEL    EKG None  Radiology CT ABDOMEN PELVIS W CONTRAST  Result Date: 09/25/2021 CLINICAL DATA:  Right lower quadrant abdominal pain. EXAM: CT ABDOMEN AND PELVIS WITH CONTRAST TECHNIQUE: Multidetector CT imaging of the abdomen and pelvis was performed using the standard protocol following bolus administration of intravenous contrast. CONTRAST:  143m OMNIPAQUE IOHEXOL 300 MG/ML  SOLN COMPARISON:  MRI abdomen 09/05/2021. CT abdomen and pelvis 11/27/2020. FINDINGS: Lower chest: No acute abnormality. Hepatobiliary: There are rounded hypodensities in the liver which are unchanged and previously characterized as cysts and hemangiomas on prior MRI. No new liver lesions are identified. The gallbladder and bile ducts are within normal limits. Pancreas: Unremarkable. No pancreatic ductal dilatation or surrounding inflammatory changes. Spleen: Normal in size without focal abnormality. Adrenals/Urinary Tract: There is a left-sided extrarenal pelvis. The kidneys, adrenal glands and bladder are otherwise within normal limits. Stomach/Bowel: Stomach is within normal limits. Appendix appears normal. No evidence of bowel wall thickening, distention, or inflammatory changes. There is colonic diverticulosis without evidence for acute diverticulitis. Vascular/Lymphatic: No significant vascular findings are present. No enlarged abdominal or  pelvic lymph nodes. Reproductive: Prostate is unremarkable. Other: No  abdominal wall hernia or abnormality. No abdominopelvic ascites. Musculoskeletal: Fusion hardware is seen at the L4-L5 disc space. No acute fractures are seen. IMPRESSION: No acute localizing process in the abdomen or pelvis. Colonic diverticulosis without evidence for acute diverticulitis. Electronically Signed   By: Ronney Asters M.D.   On: 09/25/2021 20:36    Procedures Procedures   Medications Ordered in ED Medications  lactated ringers bolus 1,000 mL (0 mLs Intravenous Stopped 09/25/21 2316)  ondansetron (ZOFRAN) injection 4 mg (4 mg Intravenous Given 09/25/21 2000)  HYDROmorphone (DILAUDID) injection 1 mg (1 mg Intravenous Given 09/25/21 2000)  iohexol (OMNIPAQUE) 300 MG/ML solution 100 mL (100 mLs Intravenous Contrast Given 09/25/21 2011)  predniSONE (DELTASONE) tablet 60 mg (60 mg Oral Given 09/25/21 2315)    ED Course  I have reviewed the triage vital signs and the nursing notes.  Pertinent labs & imaging results that were available during my care of the patient were reviewed by me and considered in my medical decision making (see chart for details).    MDM Rules/Calculators/A&P                          Patient presents for several weeks of abdominal pain, bloody stool, fatigue, and subjective fevers.  He is afebrile on arrival.  Vital signs are notable for moderate hypertension.  Prior to being bedded in the ED, lab work was obtained.  Lab work shows no leukocytosis and normal platelets.  Hemoglobin has had a 1.5 g/dL drop from 1 month ago.  Given his history, this is likely from lower GI bleed.  On exam, patient is well-appearing.  He does appear uncomfortable at this time.  Patient was given IV fluids and symptomatic relief for his abdominal pain and nausea.  No external hemorrhoids or current rectal bleeding is present on exam.  A CT scan of abdomen and pelvis was obtained which did not show any acute findings.  Patient was given prednisone for treatment of UC flare.  He was  prescribed a prednisone taper and encouraged to follow-up with his gastroenterologist as soon as possible.  As needed Zofran was prescribed for any recurrence of nausea.  He was encouraged to return to the ED if he does experience any worsening severity symptoms or p.o. intolerance, patient was discharged in stable condition.  Final Clinical Impression(s) / ED Diagnoses Final diagnoses:  Blood per rectum    Rx / DC Orders ED Discharge Orders          Ordered    predniSONE (STERAPRED UNI-PAK 21 TAB) 10 MG (21) TBPK tablet  Daily        09/25/21 2306    ondansetron (ZOFRAN ODT) 4 MG disintegrating tablet  Every 8 hours PRN        09/25/21 2307             Godfrey Pick, MD 09/26/21 0155

## 2021-09-25 NOTE — ED Triage Notes (Signed)
Patient reports to the ER for UC flare. Patient states as of lately though it is just blood. Patient reports abdominal and back pain and is concerned about his hemoglobin levels. Patient reports his body feels like it is aching and he is having constant leaking of blood from the rectum

## 2021-09-25 NOTE — Discharge Instructions (Signed)
A prescription for steroid taper was sent to [facility] on [address].  Call your gastroenterologist to schedule a follow-up appointment as soon as possible.  Take Zofran as needed for nausea.  If you develop worsening severity of symptoms, please return to the emergency department.

## 2021-10-07 ENCOUNTER — Ambulatory Visit: Payer: BC Managed Care – PPO | Admitting: Physician Assistant

## 2021-10-07 ENCOUNTER — Encounter: Payer: Self-pay | Admitting: Physician Assistant

## 2021-10-07 ENCOUNTER — Other Ambulatory Visit: Payer: Self-pay

## 2021-10-07 VITALS — BP 120/70 | HR 94 | Temp 97.6°F | Ht 72.0 in | Wt 210.5 lb

## 2021-10-07 DIAGNOSIS — K51911 Ulcerative colitis, unspecified with rectal bleeding: Secondary | ICD-10-CM

## 2021-10-07 DIAGNOSIS — F331 Major depressive disorder, recurrent, moderate: Secondary | ICD-10-CM | POA: Diagnosis not present

## 2021-10-07 DIAGNOSIS — F419 Anxiety disorder, unspecified: Secondary | ICD-10-CM | POA: Diagnosis not present

## 2021-10-07 DIAGNOSIS — M47816 Spondylosis without myelopathy or radiculopathy, lumbar region: Secondary | ICD-10-CM

## 2021-10-07 DIAGNOSIS — Z23 Encounter for immunization: Secondary | ICD-10-CM

## 2021-10-07 MED ORDER — DIAZEPAM 5 MG PO TABS: 5.0000 mg | ORAL_TABLET | Freq: Every evening | ORAL | 1 refills | Status: DC | PRN

## 2021-10-07 MED ORDER — PRAZOSIN HCL 1 MG PO CAPS: 2.0000 mg | ORAL_CAPSULE | Freq: Every day | ORAL | 1 refills | Status: DC

## 2021-10-07 MED ORDER — VORTIOXETINE HBR 20 MG PO TABS: 20.0000 mg | ORAL_TABLET | Freq: Every day | ORAL | 1 refills | Status: DC

## 2021-10-07 MED ORDER — VORTIOXETINE HBR 10 MG PO TABS: 20.0000 mg | ORAL_TABLET | Freq: Every day | ORAL | 0 refills | Status: DC

## 2021-10-07 NOTE — Patient Instructions (Signed)
It was great to see you!  I will reach out to Dr. Bryan Lemma and Nicoletta Ba about your need for appointment --If you do not hear anything from either of our offices by Friday please let me know  Refill of medications today  Please call [phone] to get scheduled with [facility] for talk therapy. If unable to schedule for some reason, let me know if you need referral.  Let's follow-up in 3-6 months, sooner if you have concerns.  If a referral was placed today, you will be contacted for an appointment. Please note that routine referrals can sometimes take up to 3-4 weeks to process. Please call our office if you haven't heard anything after this time frame.  Take care,  Inda Coke PA-C

## 2021-10-07 NOTE — Addendum Note (Signed)
Addended by: Marian Sorrow on: 10/07/2021 04:29 PM   Modules accepted: Orders

## 2021-10-07 NOTE — Progress Notes (Addendum)
Daniel Marsh is a 40 y.o. male here for a follow up of   History of Present Illness:   Chief Complaint  Patient presents with   Ulcerative Colitis    Went to ED on 10/26.    HPI  Anxiety/Depression Daniel Marsh expresses he had been seeing Dr. Michail Sermon, behavioral health, but after missing an appointment due to emergency oral surgery, he has not returned. Daniel Marsh states they were making progress but is not aware of how to make a new appointment. He is interested in continuing with Dr. Michail Sermon.   Currently compliant with taking trintellix 20 mg with no adverse effects. He believes it to be helping him function in daily life. He is managing well. He also needs refill on 2 mg prazosin daily and 5 mg valium prn.   Ulcerative Colitis  Daniel Marsh recently had a UC flare that resulted in him visiting the ED on 09/25/21 per my advice prior to schedule visit. At the time Daniel Marsh had been experiencing abdominal pain, rectal pain, hematochezia, fatigue, and subjective fevers for the past six weeks. He also had intermittent nausea and one episode of emesis.   During ED visit Daniel Marsh underwent further evaluation and CT scan of abdomen and pelvis was performed which resulted as normal with no acute findings. Following this visit he was prescribed with a prednisone taper and zofran 4 mg as well as encouraged to follow up with his gastroenterologist.    Today, Daniel Marsh reports he has felt no change following taking the prednisone taper. Currently he is still experiencing rectal bleeding as well as awaiting an appointment with gastroenterology. At this time he is interested in increasing apriso dosage since he feels his current dosage isn't beneficial.    Arthritis  According to Daniel Marsh, he has been following up with Dr. French Ana at [facility] about this issue. It was advised he begin cartilage injections in order to prolong the option of surgery. States his left knee is worse than his  right when it comes to cartilage, so they will be starting with his right to test this option out. Currently managing well.  He would like handicap placard today if possible.   Past Medical History:  Diagnosis Date   Anxiety    Arthritis    Depression    Hypertension    Liver disease    Myocardial infarction [facility])    PTSD (post-traumatic stress disorder)    Ulcerative colitis ([place]) 2019     Social History   Tobacco Use   Smoking status: Some Days    Types: Cigarettes, Cigars   Smokeless tobacco: Current    Types: Chew  Vaping Use   Vaping Use: Never used  Substance Use Topics   Alcohol use: Yes    Alcohol/week: 13.0 standard drinks    Types: 10 Cans of beer, 3 Shots of liquor per week   Drug use: Yes    Types: Marijuana    Comment: occ    Past Surgical History:  Procedure Laterality Date   ANKLE SURGERY Right 2011   KNEE SURGERY Left 2020   SPINE SURGERY  2013   Lumbar fusion L4-S1   WISDOM TOOTH EXTRACTION      Family History  Problem Relation Age of Onset   Osteoarthritis Mother    Depression Mother    Heart attack Mother    Osteoarthritis Father    Depression Father    Depression Brother    Diabetes Maternal Grandmother    Hyperlipidemia  Maternal Grandmother    Hypertension Maternal Grandmother    Heart attack Maternal Grandmother    Osteoarthritis Maternal Grandfather    COPD Maternal Grandfather    Kidney disease Maternal Grandfather    Renal cancer Maternal Grandfather    Alcohol abuse Paternal Grandmother    COPD Paternal Grandmother    Diabetes Paternal Grandmother    Hypertension Paternal Grandmother    Lung cancer Paternal Grandmother    Osteoarthritis Paternal Grandfather    Heart attack Paternal Grandfather    Heart failure Paternal Grandfather    Depression Brother    Pancreatic cancer Maternal Aunt    Lung cancer Paternal Aunt    Colon cancer Maternal Uncle     No Known Allergies  Current Medications:   Current Outpatient  Medications:    amitriptyline (ELAVIL) 25 MG tablet, Take 1 tablet (25 mg total) by mouth at bedtime., Disp: 30 tablet, Rfl: 5   diazepam (VALIUM) 5 MG tablet, Take 1 tablet (5 mg total) by mouth at bedtime. (Patient taking differently: Take 5 mg by mouth at bedtime as needed for anxiety.), Disp: 30 tablet, Rfl: 2   dicyclomine (BENTYL) 10 MG capsule, Take 1 capsule (10 mg total) by mouth 3 (three) times daily as needed (abdominal pain/ cramping)., Disp: 90 capsule, Rfl: 6   gabapentin (NEURONTIN) 600 MG tablet, Take 2 tablets (1,200 mg total) by mouth 3 (three) times daily., Disp: 180 tablet, Rfl: 5   mesalamine (APRISO) 0.375 g 24 hr capsule, Take 1 capsule (0.375 g total) by mouth in the morning, at noon, in the evening, and at bedtime., Disp: 120 capsule, Rfl: 3   omeprazole (PRILOSEC) 40 MG capsule, Take 1 capsule (40 mg total) by mouth daily., Disp: 90 capsule, Rfl: 3   ondansetron (ZOFRAN ODT) 4 MG disintegrating tablet, Take 1 tablet (4 mg total) by mouth every 8 (eight) hours as needed for nausea or vomiting., Disp: 20 tablet, Rfl: 0   vortioxetine HBr (TRINTELLIX) 20 MG TABS tablet, Take 20 mg by mouth daily., Disp: , Rfl:    prazosin (MINIPRESS) 1 MG capsule, Take 2 capsules (2 mg total) by mouth at bedtime., Disp: 180 capsule, Rfl: 1   Review of Systems:   ROS Negative unless otherwise specified per HPI. Vitals:   Vitals:   10/07/21 1508  BP: 120/70  Pulse: 94  Temp: 97.6 F (36.4 C)  TempSrc: Other (Comment)  SpO2: 98%  Weight: 210 lb 8 oz (95.5 kg)  Height: 6' (1.829 m)     Body mass index is 28.55 kg/m.  Physical Exam:   Physical Exam Vitals and nursing note reviewed.  Constitutional:      General: He is not in acute distress.    Appearance: He is well-developed. He is not ill-appearing or toxic-appearing.  Cardiovascular:     Rate and Rhythm: Normal rate and regular rhythm.     Pulses: Normal pulses.     Heart sounds: Normal heart sounds, S1 normal and S2  normal.  Pulmonary:     Effort: Pulmonary effort is normal.     Breath sounds: Normal breath sounds.  Skin:    General: Skin is warm and dry.  Neurological:     Mental Status: He is alert.     GCS: GCS eye subscore is 4. GCS verbal subscore is 5. GCS motor subscore is 6.  Psychiatric:        Speech: Speech normal.        Behavior: Behavior normal. Behavior is cooperative.  Assessment and Plan:   Ulcerative colitis with rectal bleeding, unspecified location [facility]) Uncontrolled I will also reach out to GI on his behalf to let him know that he is experiencing ongoing, uncontrolled bleeding. Repeat CBC today Continue to work on hydration and iron-rich foods  Anxiety; Moderate episode of recurrent major depressive disorder (HCC) Overall controlled Continue 20 mg trintellix, prazosin 2 mg daily, and valium 5 mg prn Recommend that he reach out to [facility] Else to follow-up on talk therapy, if unable to schedule for any reason and needs new referral, I asked him to let us know I discussed with patient that if they develop any SI, to tell someone immediately and seek medical attention.   Need for immunization against influenza Completed today  Facet arthropathy, lumbar Mgmt per specialists Did provide handicap placard today   I,Havlyn C Ratchford,acting as a scribe for [employer], PA.,have documented all relevant documentation on the behalf of Inda Coke, PA,as directed by  Inda Coke, PA while in the presence of Inda Coke, [place].   I, Inda Coke, [place], have reviewed all documentation for this visit. The documentation on 10/07/21 for the exam, diagnosis, procedures, and orders are all accurate and complete.   Inda Coke, PA-C

## 2021-10-08 ENCOUNTER — Other Ambulatory Visit: Payer: Self-pay

## 2021-10-08 ENCOUNTER — Telehealth: Payer: Self-pay

## 2021-10-08 DIAGNOSIS — K625 Hemorrhage of anus and rectum: Secondary | ICD-10-CM

## 2021-10-08 DIAGNOSIS — K921 Melena: Secondary | ICD-10-CM

## 2021-10-08 NOTE — Telephone Encounter (Signed)
-----  Message from Lavena Bullion, DO sent at 10/07/2021  5:34 PM EST ----- Aldona Bar, Thanks for letting us know about CJ.  We will get him in to see [place].  Beth or Olivia Mackie, ok to schedule appointment with me.  Can we also please obtain ESR, CRP, fecal calprotectin?  Thank you. ----- Message ----- From: Alfredia Ferguson, PA-C Sent: 10/07/2021   4:33 PM EST To: Inda Coke, PA, Greggory Keen, LPN, #  Thanks Aldona Bar - will get him in to be seen   Beth ( or covering nurse - please get pt an appt first available with Dr Bryan Lemma  preferably ,or if nothing available  in next month  then on my schedule- thanks ----- Message ----- From: Inda Coke, PA Sent: 10/07/2021   4:01 PM EST To: Alfredia Ferguson, PA-C, Vito Cirigliano V, DO  Dr. Bryan Lemma and Amy,  Mutual patient of ours. He is having an ongoing UC flare. He went to ED on 09/25/21 and was given prednisone. Symptoms are ongoing. I am repeating CBC in office today. He is reportedly having difficulty scheduling appointment with your office and ask that I reach out on his behalf.  Thanks for all you do!  Aldona Bar

## 2021-10-08 NOTE — Telephone Encounter (Signed)
Order in [facility] for lab and patient agrees to come into our lab in the next few days for them. Scheduled office visit with Nicoletta Ba PA on 10/17/21. (Dr. Bryan Lemma did not have any openings until first of the year).

## 2021-10-09 LAB — CBC WITH DIFFERENTIAL/PLATELET
Basophils Absolute: 0.1 10*3/uL (ref 0.0–0.1)
Basophils Relative: 1.1 % (ref 0.0–3.0)
Eosinophils Absolute: 0 10*3/uL (ref 0.0–0.7)
Eosinophils Relative: 0.9 % (ref 0.0–5.0)
HCT: 37.3 % — ABNORMAL LOW (ref 39.0–52.0)
Hemoglobin: 11.8 g/dL — ABNORMAL LOW (ref 13.0–17.0)
Lymphocytes Relative: 23.9 % (ref 12.0–46.0)
Lymphs Abs: 1.2 10*3/uL (ref 0.7–4.0)
MCHC: 31.7 g/dL (ref 30.0–36.0)
MCV: 88.4 fl (ref 78.0–100.0)
Monocytes Absolute: 0.6 10*3/uL (ref 0.1–1.0)
Monocytes Relative: 11.8 % (ref 3.0–12.0)
Neutro Abs: 3 10*3/uL (ref 1.4–7.7)
Neutrophils Relative %: 62.3 % (ref 43.0–77.0)
Platelets: 262 10*3/uL (ref 150.0–400.0)
RBC: 4.22 Mil/uL (ref 4.22–5.81)
RDW: 16.4 % — ABNORMAL HIGH (ref 11.5–15.5)
WBC: 4.9 10*3/uL (ref 4.0–10.5)

## 2021-10-14 ENCOUNTER — Other Ambulatory Visit (INDEPENDENT_AMBULATORY_CARE_PROVIDER_SITE_OTHER): Payer: BC Managed Care – PPO

## 2021-10-14 DIAGNOSIS — K921 Melena: Secondary | ICD-10-CM

## 2021-10-14 DIAGNOSIS — K625 Hemorrhage of anus and rectum: Secondary | ICD-10-CM

## 2021-10-14 LAB — SEDIMENTATION RATE: Sed Rate: 8 mm/hr (ref 0–15)

## 2021-10-14 LAB — C-REACTIVE PROTEIN: CRP: <1 mg/dL (ref 0.5–20.0)

## 2021-10-17 ENCOUNTER — Ambulatory Visit (INDEPENDENT_AMBULATORY_CARE_PROVIDER_SITE_OTHER): Payer: BC Managed Care – PPO | Admitting: Physician Assistant

## 2021-10-17 ENCOUNTER — Encounter: Payer: Self-pay | Admitting: Physician Assistant

## 2021-10-17 VITALS — BP 130/80 | HR 75 | Ht 72.0 in | Wt 212.0 lb

## 2021-10-17 DIAGNOSIS — Z8719 Personal history of other diseases of the digestive system: Secondary | ICD-10-CM

## 2021-10-17 DIAGNOSIS — K51911 Ulcerative colitis, unspecified with rectal bleeding: Secondary | ICD-10-CM

## 2021-10-17 DIAGNOSIS — K625 Hemorrhage of anus and rectum: Secondary | ICD-10-CM

## 2021-10-17 DIAGNOSIS — R197 Diarrhea, unspecified: Secondary | ICD-10-CM | POA: Diagnosis not present

## 2021-10-17 DIAGNOSIS — D649 Anemia, unspecified: Secondary | ICD-10-CM | POA: Diagnosis not present

## 2021-10-17 MED ORDER — PREDNISONE 20 MG PO TABS
ORAL_TABLET | ORAL | 0 refills | Status: AC
Start: 2021-10-17 — End: 2021-11-28

## 2021-10-17 MED ORDER — MESALAMINE ER 0.375 G PO CP24: 375.0000 mg | ORAL_CAPSULE | Freq: Four times a day (QID) | ORAL | 6 refills | Status: DC

## 2021-10-17 MED ORDER — DICYCLOMINE HCL 10 MG PO CAPS: 10.0000 mg | ORAL_CAPSULE | Freq: Three times a day (TID) | ORAL | 3 refills | Status: AC | PRN

## 2021-10-17 NOTE — Patient Instructions (Signed)
If you are age 38 or younger, your body mass index should be between 19-25. Your Body mass index is 28.75 kg/m. If this is out of the aformentioned range listed, please consider follow up with your Primary Care Provider.  ________________________________________________________  The [facility] providers would like to encourage you to use [facility] to communicate with providers for non-urgent requests or questions.  Due to long hold times on the telephone, sending your provider a message by [facility] may be a faster and more efficient way to get a response.  Please allow 48 business hours for a response.  Please remember that this is for non-urgent requests.  _______________________________________________________  Refill of Apreso and dicyclomine have been sent to your pharmacy.  START Prednisone 20 mg 2 tablets in the morning for 2 weeks, then 1.5 tablets for 2 weeks, then 1 tablet for 2 weeks.  Call or send a message to the office in 10 days if symptoms have not improved.  You have been scheduled to follow up with Dr. Bryan Lemma on November 26, 2021 at 11:20 am at our [facility] office at the [facility] on [address] [place].  Thank you for entrusting me with your care and choosing [facility].  Amy Esterwood, PA-C

## 2021-10-17 NOTE — Progress Notes (Signed)
Subjective:    Patient ID: Daniel Marsh, male    DOB: 1981/05/09, 40 y.o.   MRN: [id number]  HPI "Daniel Marsh" is a pleasant 40 year old African-American male, established with Dr. Bryan Lemma with previous diagnosis of ulcerative colitis made in 2019, elsewhere.  He had been managed with Apriso.  He did have some breakthrough symptoms in 2021 when he ran out of medication, and required a steroid taper in August 2021 due to exacerbation of symptoms. He underwent colonoscopy in August 2021 here with finding of a 4 mm sigmoid hyperplastic polyp, mild scattered sigmoid erythema however biopsies showed no active inflammation from the left, transverse and ascending colon, he was noted to have grade 3 internal hemorrhoids and external hemorrhoids. He has undergone hemorrhoidal banding sessions x2 per Dr. Bryan Lemma the last in April 2022.  Patient says he was doing well until about 6 weeks ago when he started having recurrent rectal bleeding and increased frequency of bowel movements.  He is now having at least 4-6 loose stools per day, relates blood mixed in with the bowel movements and in the commode and on the tissue.  He is having intermittent mid abdominal pain and discomfort.  He says he gets episodes of rectal pressure that generally signaled that he will start passing "just blood".  He had been seen by his PCP on 09/25/2021 and was running a fever that day and was sent to the emergency room because of his other symptoms. CT of the abdomen pelvis was done which showed unchanged rounded densities in the liver consistent with cysts and a hepatic hemangioma, noted colonic diverticulosis but no diverticulitis and no colonic wall thickening noted. Labs-WBC 6.0/hemoglobin 12.2/hematocrit 37.9, c-Met unremarkable/lipase 61  He was given a steroid Dosepak, which he says he completed but really has not noticed any change in his symptoms. Since then he had sed rate and CRP which were both normal and fecal  calprotectin has been collected and is pending  Repeat hemoglobin on 10/15/1999 2211.8 hematocrit 37.3   Review of Systems Pertinent positive and negative review of systems were noted in the above HPI section.  All other review of systems was otherwise negative.   Outpatient Encounter Medications as of 10/17/2021  Medication Sig   amitriptyline (ELAVIL) 25 MG tablet Take 1 tablet (25 mg total) by mouth at bedtime.   diazepam (VALIUM) 5 MG tablet Take 1 tablet (5 mg total) by mouth at bedtime as needed for anxiety.   gabapentin (NEURONTIN) 600 MG tablet Take 2 tablets (1,200 mg total) by mouth 3 (three) times daily.   omeprazole (PRILOSEC) 40 MG capsule Take 1 capsule (40 mg total) by mouth daily.   ondansetron (ZOFRAN ODT) 4 MG disintegrating tablet Take 1 tablet (4 mg total) by mouth every 8 (eight) hours as needed for nausea or vomiting.   prazosin (MINIPRESS) 1 MG capsule Take 2 capsules (2 mg total) by mouth at bedtime.   predniSONE (DELTASONE) 20 MG tablet Take 2 tablets (40 mg total) by mouth daily with breakfast for 14 days, THEN 1.5 tablets (30 mg total) daily with breakfast for 14 days, THEN 1 tablet (20 mg total) daily with breakfast for 14 days.   vortioxetine HBr (TRINTELLIX) 10 MG TABS tablet Take 2 tablets (20 mg total) by mouth daily.   vortioxetine HBr (TRINTELLIX) 20 MG TABS tablet Take 1 tablet (20 mg total) by mouth daily.   [DISCONTINUED] dicyclomine (BENTYL) 10 MG capsule Take 1 capsule (10 mg total) by mouth 3 (three) times daily  as needed (abdominal pain/ cramping).   [DISCONTINUED] mesalamine (APRISO) 0.375 g 24 hr capsule Take 1 capsule (0.375 g total) by mouth in the morning, at noon, in the evening, and at bedtime.   dicyclomine (BENTYL) 10 MG capsule Take 1 capsule (10 mg total) by mouth 3 (three) times daily as needed (abdominal pain/ cramping).   mesalamine (APRISO) 0.375 g 24 hr capsule Take 1 capsule (0.375 g total) by mouth in the morning, at noon, in the  evening, and at bedtime.   No facility-administered encounter medications on file as of 10/17/2021.   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   Hyperlipidemia 06/01/2020   PTSD (post-traumatic stress disorder)    Hypertension    Depression    Anxiety    Ulcerative colitis ([place]) 2019   Myocardial infarction [facility]) 2015   Social History   Socioeconomic History   Marital status: Married    Spouse name: Not on file   Number of children: 3   Years of education: Not on file   Highest education level: Not on file  Occupational History   Not on file  Tobacco Use   Smoking status: Some Days    Types: Cigarettes, Cigars   Smokeless tobacco: Current    Types: Chew  Vaping Use   Vaping Use: Never used  Substance and Sexual Activity   Alcohol use: Yes    Alcohol/week: 13.0 standard drinks    Types: 10 Cans of beer, 3 Shots of liquor per week   Drug use: Yes    Types: Marijuana    Comment: occ   Sexual activity: Yes  Other Topics Concern   Not on file  Social History Narrative   ** Merged History Encounter **       Was in the [employer] for 7 years, required lumbar surgery while in the [employer] and had poor outcome Divorced and re-married 47 yo son    Social Determinants of Radio broadcast assistant Strain: Not on file  Food Insecurity: Not on file  Transportation Needs: Not on file  Physical Activity: Not on file  Stress: Not on file  Social Connections: Not on file  Intimate Partner Violence: Not on file    Daniel Marsh family history includes Alcohol abuse in his paternal grandmother; COPD in his maternal grandfather and paternal  grandmother; Colon cancer in his maternal uncle; Depression in his brother, brother, father, and mother; Diabetes in his maternal grandmother and paternal grandmother; Heart attack in his maternal grandmother, mother, and paternal grandfather; Heart failure in his paternal grandfather; Hyperlipidemia in his maternal grandmother; Hypertension in his maternal grandmother and paternal grandmother; Kidney disease in his maternal grandfather; Lung cancer in his paternal aunt and paternal grandmother; Osteoarthritis in his father, maternal grandfather, mother, and paternal grandfather; Pancreatic cancer in his maternal aunt; Renal cancer in his maternal grandfather.      Objective:    Vitals:   10/17/21 1342  BP: 130/80  Pulse: 75    Physical Exam Well-developed well-nourished  AA male  in no acute distress.  Height, Weight,212  BMI 28 ambulates with a cane  HEENT; nontraumatic normocephalic, EOMI, PE R LA, sclera  anicteric. Oropharynx; not examined today Neck; supple, no JVD Cardiovascular; regular rate and rhythm with S1-S2, no murmur rub or gallop Pulmonary; Clear bilaterally Abdomen; soft, he is tender in the left lower quadrant left mid quadrant and left upper quadrant, nondistended, no palpable mass or hepatosplenomegaly, bowel sounds are active Rectal; not done today Skin; benign exam, no jaundice rash or appreciable lesions Extremities; no clubbing cyanosis or edema skin warm and dry Neuro/Psych; alert and oriented x4, grossly nonfocal mood and affect appropriate        Assessment & Plan:   #74 40 year old African-American male with history of ulcerative colitis initially diagnosed in 2019 elsewhere and has been maintained on Apriso.  Colonoscopy here August 2021 with finding of grade 3 internal hemorrhoids, external hemorrhoids and no active colitis.  Patient presents now with 6 to 8-week history of daily rectal bleeding, and increased frequency of bowel movements to 4 to 6/day,  associated with abdominal cramping/discomfort.  Symptoms are most consistent with exacerbation of ulcerative colitis. Recent CT October 2022 no obvious bowel wall thickening, colonic diverticulosis  #2 grade 3 internal hemorrhoids-status post hemorrhoidal banding sessions x2 #3 coronary artery disease status post prior MI #4.  PTSD #5.  Anxiety/depression #6.  Failed back syndrome #7.  Anemia normocytic secondary to above  Plan; continue Apriso 0.3754 tablets daily, refills sent Bentyl 10 mg p.o. 3 times daily to 4 times daily as needed for abdominal cramping/pain Start prednisone 40 mg p.o. daily (have sent 20 mg tablets) take 40 mg daily x2 weeks then decrease to 30 mg daily x2 weeks then decrease to 20 mg daily x2 weeks then decrease to 10 mg daily x2 weeks. Await results of fecal calprotectin Patient was made a follow-up appointment with Dr. Bryan Lemma in about 3 weeks.  He was advised to call here in 10 days if he has not had any improvement in symptoms on prednisone or at any point if he has worsening of symptoms. If he fails to respond to prednisone and will likely need repeat colonoscopy to reassess. We began discussion today regarding possible need for Biologics, depending on his course over the next few months.   Daniel Marsh Daniel Zayon Trulson PA-C 10/17/2021   Cc: Inda Coke, [place]

## 2021-11-06 NOTE — Progress Notes (Signed)
Agree with the assessment and plan as outlined by Nicoletta Ba, PA-C.  Kasheem Toner, DO, [facility]

## 2021-11-20 ENCOUNTER — Encounter: Payer: Self-pay | Admitting: Physician Assistant

## 2021-11-26 ENCOUNTER — Ambulatory Visit: Payer: BC Managed Care – PPO | Admitting: Gastroenterology

## 2021-12-23 DIAGNOSIS — S93602A Unspecified sprain of left foot, initial encounter: Secondary | ICD-10-CM | POA: Diagnosis not present

## 2021-12-30 ENCOUNTER — Inpatient Hospital Stay (HOSPITAL_COMMUNITY): Admission: AD | Admit: 2021-12-30 | Payer: BC Managed Care – PPO | Source: Intra-hospital | Admitting: Psychiatry

## 2021-12-30 ENCOUNTER — Ambulatory Visit (HOSPITAL_COMMUNITY)
Admission: EM | Admit: 2021-12-30 | Discharge: 2021-12-30 | Disposition: A | Discharge status: Home / Self Care | Payer: BC Managed Care – PPO | Attending: Psychiatry | Admitting: Psychiatry

## 2021-12-30 DIAGNOSIS — F1290 Cannabis use, unspecified, uncomplicated: Secondary | ICD-10-CM | POA: Insufficient documentation

## 2021-12-30 DIAGNOSIS — Z20822 Contact with and (suspected) exposure to covid-19: Secondary | ICD-10-CM | POA: Insufficient documentation

## 2021-12-30 DIAGNOSIS — F17210 Nicotine dependence, cigarettes, uncomplicated: Secondary | ICD-10-CM | POA: Insufficient documentation

## 2021-12-30 DIAGNOSIS — F419 Anxiety disorder, unspecified: Secondary | ICD-10-CM | POA: Diagnosis not present

## 2021-12-30 DIAGNOSIS — Z79899 Other long term (current) drug therapy: Secondary | ICD-10-CM | POA: Insufficient documentation

## 2021-12-30 DIAGNOSIS — F332 Major depressive disorder, recurrent severe without psychotic features: Secondary | ICD-10-CM | POA: Diagnosis not present

## 2021-12-30 DIAGNOSIS — Z630 Problems in relationship with spouse or partner: Secondary | ICD-10-CM | POA: Diagnosis not present

## 2021-12-30 DIAGNOSIS — F4310 Post-traumatic stress disorder, unspecified: Secondary | ICD-10-CM | POA: Diagnosis not present

## 2021-12-30 DIAGNOSIS — F1010 Alcohol abuse, uncomplicated: Secondary | ICD-10-CM | POA: Diagnosis not present

## 2021-12-30 LAB — CBC WITH DIFFERENTIAL/PLATELET
Abs Immature Granulocytes: 0.07 10*3/uL (ref 0.00–0.07)
Basophils Absolute: 0 10*3/uL (ref 0.0–0.1)
Basophils Relative: 0 %
Eosinophils Absolute: 0 10*3/uL (ref 0.0–0.5)
Eosinophils Relative: 0 %
HCT: 38.8 % — ABNORMAL LOW (ref 39.0–52.0)
Hemoglobin: 12.5 g/dL — ABNORMAL LOW (ref 13.0–17.0)
Immature Granulocytes: 1 %
Lymphocytes Relative: 19 %
Lymphs Abs: 1.1 10*3/uL (ref 0.7–4.0)
MCH: 26.4 pg (ref 26.0–34.0)
MCHC: 32.2 g/dL (ref 30.0–36.0)
MCV: 82 fL (ref 80.0–100.0)
Monocytes Absolute: 0.6 10*3/uL (ref 0.1–1.0)
Monocytes Relative: 10 %
Neutro Abs: 4.2 10*3/uL (ref 1.7–7.7)
Neutrophils Relative %: 70 %
Platelets: 289 10*3/uL (ref 150–400)
RBC: 4.73 MIL/uL (ref 4.22–5.81)
RDW: 16.9 % — ABNORMAL HIGH (ref 11.5–15.5)
WBC: 5.9 10*3/uL (ref 4.0–10.5)
nRBC: 0 % (ref 0.0–0.2)

## 2021-12-30 LAB — COMPREHENSIVE METABOLIC PANEL
ALT: 13 U/L (ref 0–44)
AST: 22 U/L (ref 15–41)
Albumin: 4.4 g/dL (ref 3.5–5.0)
Alkaline Phosphatase: 50 U/L (ref 38–126)
Anion gap: 12 (ref 5–15)
BUN: 8 mg/dL (ref 6–20)
CO2: 24 mmol/L (ref 22–32)
Calcium: 9.4 mg/dL (ref 8.9–10.3)
Chloride: 107 mmol/L (ref 98–111)
Creatinine, Ser: 0.96 mg/dL (ref 0.61–1.24)
GFR, Estimated: >60 mL/min (ref >60)
Glucose, Bld: 93 mg/dL (ref 70–99)
Potassium: 4.5 mmol/L (ref 3.5–5.1)
Sodium: 143 mmol/L (ref 135–145)
Total Bilirubin: 0.4 mg/dL (ref 0.3–1.2)
Total Protein: 7.4 g/dL (ref 6.5–8.1)

## 2021-12-30 LAB — POCT URINE DRUG SCREEN - MANUAL ENTRY (I-SCREEN)
POC Amphetamine UR: NOT DETECTED
POC Buprenorphine (BUP): NOT DETECTED
POC Cocaine UR: NOT DETECTED
POC Marijuana UR: POSITIVE — AB
POC Methadone UR: NOT DETECTED
POC Methamphetamine UR: NOT DETECTED
POC Morphine: NOT DETECTED
POC Oxazepam (BZO): NOT DETECTED
POC Oxycodone UR: NOT DETECTED
POC Secobarbital (BAR): NOT DETECTED

## 2021-12-30 LAB — LIPID PANEL
Cholesterol: 249 mg/dL — ABNORMAL HIGH (ref 0–200)
HDL: 91 mg/dL (ref >40)
LDL Cholesterol: 135 mg/dL — ABNORMAL HIGH (ref 0–99)
Total CHOL/HDL Ratio: 2.7 RATIO
Triglycerides: 117 mg/dL (ref <150)
VLDL: 23 mg/dL (ref 0–40)

## 2021-12-30 LAB — POC SARS CORONAVIRUS 2 AG: SARSCOV2ONAVIRUS 2 AG: NEGATIVE

## 2021-12-30 LAB — RESP PANEL BY RT-PCR (FLU A&B, COVID) ARPGX2
Influenza A by PCR: NEGATIVE
Influenza B by PCR: NEGATIVE
SARS Coronavirus 2 by RT PCR: NEGATIVE

## 2021-12-30 LAB — TSH: TSH: 1.259 u[IU]/mL (ref 0.350–4.500)

## 2021-12-30 LAB — HEPATITIS PANEL, ACUTE
HCV Ab: NONREACTIVE
Hep A IgM: NONREACTIVE
Hep B C IgM: NONREACTIVE
Hepatitis B Surface Ag: NONREACTIVE

## 2021-12-30 LAB — ETHANOL: Alcohol, Ethyl (B): 114 mg/dL — ABNORMAL HIGH (ref <10)

## 2021-12-30 LAB — HEMOGLOBIN A1C
Hgb A1c MFr Bld: 5.1 % (ref 4.8–5.6)
Mean Plasma Glucose: 99.67 mg/dL

## 2021-12-30 LAB — POC SARS CORONAVIRUS 2 AG -  ED: SARS Coronavirus 2 Ag: NEGATIVE

## 2021-12-30 MED ORDER — MAGNESIUM HYDROXIDE 400 MG/5ML PO SUSP: 30.0000 mL | Freq: Every day | ORAL | Status: DC | PRN

## 2021-12-30 MED ORDER — ACETAMINOPHEN 325 MG PO TABS: 650.0000 mg | ORAL_TABLET | Freq: Four times a day (QID) | ORAL | Status: DC | PRN

## 2021-12-30 MED ORDER — TRAZODONE HCL 50 MG PO TABS: 50.0000 mg | ORAL_TABLET | Freq: Every evening | ORAL | Status: DC | PRN

## 2021-12-30 MED ORDER — HYDROXYZINE HCL 25 MG PO TABS: 25.0000 mg | ORAL_TABLET | Freq: Three times a day (TID) | ORAL | Status: DC | PRN

## 2021-12-30 MED ORDER — ALUM & MAG HYDROXIDE-SIMETH 200-200-20 MG/5ML PO SUSP: 30.0000 mL | ORAL | Status: DC | PRN

## 2021-12-30 NOTE — ED Provider Notes (Signed)
Behavioral Health Admission H&P [facility] & OBS)  Date: 12/30/21 Patient Name: Daniel Marsh MRN: [id number] Chief Complaint:  Chief Complaint  Patient presents with   Depression   Suicidal   Anxiety      Diagnoses:  Final diagnoses:  MDD (major depressive disorder), recurrent severe, without psychosis ([place])    HPI: Daniel Marsh is a 41 year old male with psychiatric history of PTSD, anxiety, and depression.  Patient presented voluntarily via law enforcement to [facility].  Patient presented with chief complaint of worsening depression. Was seen face-to-face and his chart was reviewed by this nurse practitioner.  On assessment, patient is tearful, alert and oriented x4.  He is calm and cooperative.  She is speaking in a normal tone of voice at moderate rate with good eye contact.  Patient's mood is depressed with congruent affect.  His thought process is coherent  Patient reports " I have been in a bad place mentally for a while."  Patient shares that over the past 36 months he has experienced several deaths in his family.  He reports that he has lost his grandfather, 6 aunts/uncles and 2 friends.  He reports that he is experiencing worsening anxiety and depression. He shares that he was in the [employer] for 7 years and worked in [employer]. 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 He reports that on Tuesday he was watching a TV show "[employer]' and a character was being resuscitated but didn't make it and this triggered his PTSD.   Today, he reports that he was in a verbal altercation with his wife while on the way from a football game; he says he believed that his wife may have contacted law enforcement and he was brought here for an evaluation.  He denies suicidal ideation or that he made suicidal statement during altercation  with wife to this Probation officer  however he reports having suicidal ideations to TTS counselor. Per TTS counselor, patient states "he's had a plan for a while (to jump off of a specific overpass) and that tonight he was going to attempt to kill himself." Patient admit to hx of suicidal ideation and states "ever since living in [place], I know where to go if I want to do it." He denies history of suicidal attempt or self harming.  He denies homicidal ideation, hallucination, and paranoia.  He endorses smoking 1 g of marijuana per day and consuming 1 can of beer nightly.  He also reports consuming up to 12 cans of beer "when I am felling very low." He says he drank 12 cans of beer prior to this assessment.  He reports that he resides at home with his 47-year-old son and his wife.  Patient reports that he currently takes Elavil, Valium, prazosin, and Trintellix.  He reports that he is followed by psychologist Dr. Conception Chancy, last appointment August 2022. He says he self increased his dose of Trintellix from 68m to 24msince Tuesday 12/24/21 "because I was struggling."   PHQ 2-9:  [facility]  Thoughts that you would be better off dead, or of hurting yourself in some way Several days -- Several days  PHQ-9 Total Score 20 3 24  [place] [facility] from 12/30/2021 in [facility] from 09/25/2021 in [facility]  C-SSRS RISK CATEGORY High Risk No Risk        Total Time spent with patient: 20 minutes  Musculoskeletal  Strength & Muscle Tone: within normal limits Gait & Station: normal Patient leans: Right  Psychiatric Specialty Exam  Presentation General Appearance: Appropriate for Environment  Eye Contact:Good  Speech:Clear and  Coherent  Speech Volume:Normal  Handedness:Right   Mood and Affect  Mood:Depressed; Anxious  Affect:Congruent   Thought Process  Thought Processes:Coherent  Descriptions of Associations:Intact  Orientation:Full (Time, Place and Person)  Thought Content:WDL  Diagnosis of Schizophrenia or Schizoaffective disorder in past: No   Hallucinations:Hallucinations: None  Ideas of Reference:None  Suicidal Thoughts:Suicidal Thoughts: No  Homicidal Thoughts:Homicidal Thoughts: No   Sensorium  Memory:Immediate Good; Remote Good; Recent Good  Judgment:Fair  Insight:Fair   Executive Functions  Concentration:Good  Attention Span:Good  [place] of Knowledge:Good  Language:Good   Psychomotor Activity  Psychomotor Activity:Psychomotor Activity: Normal   Assets  Assets:Communication Skills; Desire for Improvement; Housing; Intimacy; Physical Health; Social Support; Transportation   Sleep  Sleep:Sleep: Poor Number of Hours of Sleep: 3   Nutritional Assessment (For OBS and FBC admissions only) Has the patient had a weight loss or gain of 10 pounds or more in the last 3 months?: Yes Has the patient had a decrease in food intake/or appetite?: Yes Does the patient have dental problems?: No Does the patient have eating habits or behaviors that may be indicators of an eating disorder including binging or inducing vomiting?: No Has the patient recently lost weight without trying?: 1 Has the patient been eating poorly because of a decreased appetite?: 0 Malnutrition Screening Tool Score: 1    Physical Exam Vitals and nursing note reviewed.  Constitutional:      General: He is not in acute distress.    Appearance: He is well-developed.  HENT:     Head: Normocephalic and atraumatic.  Eyes:     Conjunctiva/sclera: Conjunctivae normal.  Cardiovascular:     Rate and Rhythm: Normal rate.  Pulmonary:     Effort: Pulmonary effort is normal. No respiratory  distress.  Abdominal:     Palpations: Abdomen is soft.     Tenderness: There is no abdominal tenderness.  Musculoskeletal:        General: No swelling.     Cervical back: Neck supple.  Skin:    General: Skin is warm.     Capillary Refill: Capillary refill takes less than 2 seconds.  Neurological:     Mental Status: He is alert and oriented to person, place, and time.  Psychiatric:        Attention and Perception: Attention and perception normal.        Mood and Affect: Mood is anxious and depressed. Affect is tearful.        Speech: Speech normal.        Behavior: Behavior normal. Behavior is cooperative.        Thought Content: Thought content normal.        Cognition and Memory: Cognition normal.        Judgment: Judgment normal.   Review of Systems  Constitutional: Negative.   HENT: Negative.    Eyes: Negative.   Respiratory: Negative.    Cardiovascular: Negative.   Gastrointestinal: Negative.   Genitourinary: Negative.   Musculoskeletal: Negative.   Skin: Negative.   Neurological: Negative.   Endo/Heme/Allergies:  Negative.   Psychiatric/Behavioral:  Positive for depression and substance abuse. The patient is nervous/anxious.    Pulse (!) 106, temperature 97.6 F (36.4 C), temperature source Oral, resp. rate 18, SpO2 100 %. There is no height or weight on file to calculate BMI.  Past Psychiatric History: Depression, anxiety, PTSD  Is the patient at risk to self? Yes  Has the patient been a risk to self in the past 6 months? No .    Has the patient been a risk to self within the distant past? No   Is the patient a risk to others? No   Has the patient been a risk to others in the past 6 months? No   Has the patient been a risk to others within the distant past? No   Past Medical History:  Past Medical History:  Diagnosis Date   Anxiety    Arthritis    Depression    Hypertension    Liver disease    Myocardial infarction [facility])    PTSD (post-traumatic stress  disorder)    Ulcerative colitis ([place]) 2019    Past Surgical History:  Procedure Laterality Date   ANKLE SURGERY Right 2011   KNEE SURGERY Left 2020   SPINE SURGERY  2013   Lumbar fusion L4-S1   WISDOM TOOTH EXTRACTION      Family History:  Family History  Problem Relation Age of Onset   Osteoarthritis Mother    Depression Mother    Heart attack Mother    Osteoarthritis Father    Depression Father    Depression Brother    Diabetes Maternal Grandmother    Hyperlipidemia Maternal Grandmother    Hypertension Maternal Grandmother    Heart attack Maternal Grandmother    Osteoarthritis Maternal Grandfather    COPD Maternal Grandfather    Kidney disease Maternal Grandfather    Renal cancer Maternal Grandfather    Alcohol abuse Paternal Grandmother    COPD Paternal Grandmother    Diabetes Paternal Grandmother    Hypertension Paternal Grandmother    Lung cancer Paternal Grandmother    Osteoarthritis Paternal Grandfather    Heart attack Paternal Grandfather    Heart failure Paternal Grandfather    Depression Brother    Pancreatic cancer Maternal Aunt    Lung cancer Paternal Aunt    Colon cancer Maternal Uncle     Social History:  Social History   Socioeconomic History   Marital status: Married    Spouse name: Not on file   Number of children: 3   Years of education: Not on file   Highest education level: Not on file  Occupational History   Not on file  Tobacco Use   Smoking status: Some Days    Types: Cigarettes, Cigars   Smokeless tobacco: Current    Types: Chew  Vaping Use   Vaping Use: Never used  Substance and Sexual Activity   Alcohol use: Yes    Alcohol/week: 13.0 standard drinks    Types: 10 Cans of beer, 3 Shots of liquor per week   Drug use: Yes    Types: Marijuana    Comment: occ   Sexual activity: Yes  Other Topics Concern   Not on file  Social History Narrative   ** Merged History Encounter **       Was in the [employer] for 7 years, required  lumbar surgery while in the [employer] and had poor outcome Divorced and re-married 59 yo son    Social Determinants of Health  Financial Resource Strain: Not on file  Food Insecurity: Not on file  Transportation Needs: Not on file  Physical Activity: Not on file  Stress: Not on file  Social Connections: Not on file  Intimate Partner Violence: Not on file    SDOH:  SDOH Screenings   Alcohol Screen: Not on file  Depression (PHQ2-9): Medium Risk   PHQ-2 Score: 20  Financial Resource Strain: Not on file  Food Insecurity: Not on file  Housing: Not on file  Physical Activity: Not on file  Social Connections: Not on file  Stress: Not on file  Tobacco Use: High Risk   Smoking Tobacco Use: Some Days   Smokeless Tobacco Use: Current   Passive Exposure: Not on file  Transportation Needs: Not on file    Last Labs:  Admission on 12/30/2021  Component Date Value Ref Range Status   WBC 12/30/2021 5.9  4.0 - 10.5 K/uL Final   RBC 12/30/2021 4.73  4.22 - 5.81 MIL/uL Final   Hemoglobin 12/30/2021 12.5 (L)  13.0 - 17.0 g/dL Final   HCT 12/30/2021 38.8 (L)  39.0 - 52.0 % Final   MCV 12/30/2021 82.0  80.0 - 100.0 fL Final   MCH 12/30/2021 26.4  26.0 - 34.0 pg Final   MCHC 12/30/2021 32.2  30.0 - 36.0 g/dL Final   RDW 12/30/2021 16.9 (H)  11.5 - 15.5 % Final   Platelets 12/30/2021 289  150 - 400 K/uL Final   nRBC 12/30/2021 0.0  0.0 - 0.2 % Final   Neutrophils Relative % 12/30/2021 70  % Final   Neutro Abs 12/30/2021 4.2  1.7 - 7.7 K/uL Final   Lymphocytes Relative 12/30/2021 19  % Final   Lymphs Abs 12/30/2021 1.1  0.7 - 4.0 K/uL Final   Monocytes Relative 12/30/2021 10  % Final   Monocytes Absolute 12/30/2021 0.6  0.1 - 1.0 K/uL Final   Eosinophils Relative 12/30/2021 0  % Final   Eosinophils Absolute 12/30/2021 0.0  0.0 - 0.5 K/uL Final   Basophils Relative 12/30/2021 0  % Final   Basophils Absolute 12/30/2021 0.0  0.0 - 0.1 K/uL Final   Immature Granulocytes 12/30/2021 1  %  Final   Abs Immature Granulocytes 12/30/2021 0.07  0.00 - 0.07 K/uL Final   Performed at [facility], [place] [address], [place], [place] [address]   Hgb A1c MFr Bld 12/30/2021 5.1  4.8 - 5.6 % Final   Comment: (NOTE) Pre diabetes:          5.7%-6.4%  Diabetes:              >6.4%  Glycemic control for   <7.0% adults with diabetes    Mean Plasma Glucose 12/30/2021 99.67  mg/dL Final   Performed at [facility], [place] [address], [place], [place] [address]   POC Amphetamine UR 12/30/2021 None Detected  NONE DETECTED (Cut Off Level 1000 ng/mL) Final   POC Secobarbital (BAR) 12/30/2021 None Detected  NONE DETECTED (Cut Off Level 300 ng/mL) Final   POC Buprenorphine (BUP) 12/30/2021 None Detected  NONE DETECTED (Cut Off Level 10 ng/mL) Final   POC Oxazepam (BZO) 12/30/2021 None Detected  NONE DETECTED (Cut Off Level 300 ng/mL) Final   POC Cocaine UR 12/30/2021 None Detected  NONE DETECTED (Cut Off Level 300 ng/mL) Final   POC Methamphetamine UR 12/30/2021 None Detected  NONE DETECTED (Cut Off Level 1000 ng/mL) Final   POC Morphine 12/30/2021 None Detected  NONE DETECTED (Cut  Off Level 300 ng/mL) Final   POC Oxycodone UR 12/30/2021 None Detected  NONE DETECTED (Cut Off Level 100 ng/mL) Final   POC Methadone UR 12/30/2021 None Detected  NONE DETECTED (Cut Off Level 300 ng/mL) Final   POC Marijuana UR 12/30/2021 Positive (A)  NONE DETECTED (Cut Off Level 50 ng/mL) Final   SARS Coronavirus 2 Ag 12/30/2021 Negative  Negative Preliminary   SARSCOV2ONAVIRUS 2 AG 12/30/2021 NEGATIVE  NEGATIVE Final   Comment: (NOTE) SARS-CoV-2 antigen NOT DETECTED.   Negative results are presumptive.  Negative results do not preclude SARS-CoV-2 infection and should not be used as the sole basis for treatment or other patient management decisions, including infection  control decisions, particularly in the presence of clinical signs and  symptoms consistent with COVID-19, or in those who have been  in contact with the virus.  Negative results must be combined with clinical observations, patient history, and epidemiological information. The expected result is Negative.  Fact Sheet for Patients: HandmadeRecipes.com.cy  Fact Sheet for Healthcare Providers: FuneralLife.at  This test is not yet approved or cleared by the [place] FDA and  has been authorized for detection and/or diagnosis of SARS-CoV-2 by FDA under an Emergency Use Authorization (EUA).  This EUA will remain in effect (meaning this test can be used) for the duration of  the COV                          ID-19 declaration under Section 564(b)(1) of the Act, 21 U.S.C. section 360bbb-3(b)(1), unless the authorization is terminated or revoked sooner.    Appointment on 10/14/2021  Component Date Value Ref Range Status   Sed Rate 10/14/2021 8  0 - 15 mm/hr Final   CRP 10/14/2021 <1.0  0.5 - 20.0 mg/dL Final  Office Visit on 10/07/2021  Component Date Value Ref Range Status   WBC 10/07/2021 4.9  4.0 - 10.5 K/uL Final   RBC 10/07/2021 4.22  4.22 - 5.81 Mil/uL Final   Hemoglobin 10/07/2021 11.8 (L)  13.0 - 17.0 g/dL Final   HCT 10/07/2021 37.3 (L)  39.0 - 52.0 % Final   MCV 10/07/2021 88.4  78.0 - 100.0 fl Final   MCHC 10/07/2021 31.7  30.0 - 36.0 g/dL Final   RDW 10/07/2021 16.4 (H)  11.5 - 15.5 % Final   Platelets 10/07/2021 262.0  150.0 - 400.0 K/uL Final   Neutrophils Relative % 10/07/2021 62.3  43.0 - 77.0 % Final   Lymphocytes Relative 10/07/2021 23.9  12.0 - 46.0 % Final   Monocytes Relative 10/07/2021 11.8  3.0 - 12.0 % Final   Eosinophils Relative 10/07/2021 0.9  0.0 - 5.0 % Final   Basophils Relative 10/07/2021 1.1  0.0 - 3.0 % Final   Neutro Abs 10/07/2021 3.0  1.4 - 7.7 K/uL Final   Lymphs Abs 10/07/2021 1.2  0.7 - 4.0 K/uL Final   Monocytes Absolute 10/07/2021 0.6  0.1 - 1.0 K/uL Final   Eosinophils Absolute 10/07/2021 0.0  0.0 - 0.7 K/uL Final    Basophils Absolute 10/07/2021 0.1  0.0 - 0.1 K/uL Final  Admission on 09/25/2021, Discharged on 09/25/2021  Component Date Value Ref Range Status   Lipase 09/25/2021 61 (H)  11 - 51 U/L Final   Performed at [facility], [address], [place], [place] [address]   Sodium 09/25/2021 140  135 - 145 mmol/L Final   Potassium 09/25/2021 4.2  3.5 - 5.1 mmol/L Final  Chloride 09/25/2021 106  98 - 111 mmol/L Final   CO2 09/25/2021 27  22 - 32 mmol/L Final   Glucose, Bld 09/25/2021 88  70 - 99 mg/dL Final   Glucose reference range applies only to samples taken after fasting for at least 8 hours.   BUN 09/25/2021 11  6 - 20 mg/dL Final   Creatinine, Ser 09/25/2021 0.98  0.61 - 1.24 mg/dL Final   Calcium 09/25/2021 9.0  8.9 - 10.3 mg/dL Final   Total Protein 09/25/2021 6.9  6.5 - 8.1 g/dL Final   Albumin 09/25/2021 4.4  3.5 - 5.0 g/dL Final   AST 09/25/2021 16  15 - 41 U/L Final   ALT 09/25/2021 10  0 - 44 U/L Final   Alkaline Phosphatase 09/25/2021 49  38 - 126 U/L Final   Total Bilirubin 09/25/2021 0.5  0.3 - 1.2 mg/dL Final   GFR, Estimated 09/25/2021 >60  >60 mL/min Final   Comment: (NOTE) Calculated using the CKD-EPI Creatinine Equation (2021)    Anion gap 09/25/2021 7  5 - 15 Final   Performed at [facility], [address] [place], [place] [address]   WBC 09/25/2021 6.0  4.0 - 10.5 K/uL Final   RBC 09/25/2021 4.36  4.22 - 5.81 MIL/uL Final   Hemoglobin 09/25/2021 12.2 (L)  13.0 - 17.0 g/dL Final   HCT 09/25/2021 37.9 (L)  39.0 - 52.0 % Final   MCV 09/25/2021 86.9  80.0 - 100.0 fL Final   MCH 09/25/2021 28.0  26.0 - 34.0 pg Final   MCHC 09/25/2021 32.2  30.0 - 36.0 g/dL Final   RDW 09/25/2021 15.6 (H)  11.5 - 15.5 % Final   Platelets 09/25/2021 336  150 - 400 K/uL Final   nRBC 09/25/2021 0.0  0.0 - 0.2 % Final   Performed at [facility], [address] [place], [place] [address]   Color, Urine 09/25/2021 YELLOW  YELLOW  Final   APPearance 09/25/2021 CLEAR  CLEAR Final   Specific Gravity, Urine 09/25/2021 1.025  1.005 - 1.030 Final   pH 09/25/2021 5.5  5.0 - 8.0 Final   Glucose, UA 09/25/2021 NEGATIVE  NEGATIVE mg/dL Final   Hgb urine dipstick 09/25/2021 NEGATIVE  NEGATIVE Final   Bilirubin Urine 09/25/2021 NEGATIVE  NEGATIVE Final   Ketones, ur 09/25/2021 NEGATIVE  NEGATIVE mg/dL Final   Protein, ur 09/25/2021 TRACE (A)  NEGATIVE mg/dL Final   Nitrite 09/25/2021 NEGATIVE  NEGATIVE Final   Leukocytes,Ua 09/25/2021 SMALL (A)  NEGATIVE Final   RBC / HPF 09/25/2021 0-5  0 - 5 RBC/hpf Final   WBC, UA 09/25/2021 0-5  0 - 5 WBC/hpf Final   Squamous Epithelial / LPF 09/25/2021 0-5  0 - 5 Final   Mucus 09/25/2021 PRESENT   Final   Performed at [facility], [address], [place], [place] [address]  Admission on 08/02/2021, Discharged on 08/02/2021  Component Date Value Ref Range Status   Sodium 08/02/2021 141  135 - 145 mmol/L Final   Potassium 08/02/2021 3.9  3.5 - 5.1 mmol/L Final   Chloride 08/02/2021 109  98 - 111 mmol/L Final   CO2 08/02/2021 24  22 - 32 mmol/L Final   Glucose, Bld 08/02/2021 107 (H)  70 - 99 mg/dL Final   Glucose reference range applies only to samples taken after fasting for at least 8 hours.   BUN 08/02/2021 6  6 - 20 mg/dL Final   Creatinine, Ser 08/02/2021 0.98  0.61 -  1.24 mg/dL Final   Calcium 08/02/2021 9.2  8.9 - 10.3 mg/dL Final   GFR, Estimated 08/02/2021 >60  >60 mL/min Final   Comment: (NOTE) Calculated using the CKD-EPI Creatinine Equation (2021)    Anion gap 08/02/2021 8  5 - 15 Final   Performed at [facility], [place] [address], [place], [place] [address]   WBC 08/02/2021 8.0  4.0 - 10.5 K/uL Final   RBC 08/02/2021 4.80  4.22 - 5.81 MIL/uL Final   Hemoglobin 08/02/2021 13.7  13.0 - 17.0 g/dL Final   HCT 08/02/2021 42.6  39.0 - 52.0 % Final   MCV 08/02/2021 88.8  80.0 - 100.0 fL Final   MCH 08/02/2021 28.5  26.0 - 34.0 pg Final   MCHC  08/02/2021 32.2  30.0 - 36.0 g/dL Final   RDW 08/02/2021 16.7 (H)  11.5 - 15.5 % Final   Platelets 08/02/2021 333  150 - 400 K/uL Final   nRBC 08/02/2021 0.0  0.0 - 0.2 % Final   Performed at [facility] [address], [place], [place] [address]   Color, Urine 08/02/2021 STRAW (A)  YELLOW Final   APPearance 08/02/2021 CLEAR  CLEAR Final   Specific Gravity, Urine 08/02/2021 1.005  1.005 - 1.030 Final   pH 08/02/2021 7.0  5.0 - 8.0 Final   Glucose, UA 08/02/2021 NEGATIVE  NEGATIVE mg/dL Final   Hgb urine dipstick 08/02/2021 NEGATIVE  NEGATIVE Final   Bilirubin Urine 08/02/2021 NEGATIVE  NEGATIVE Final   Ketones, ur 08/02/2021 NEGATIVE  NEGATIVE mg/dL Final   Protein, ur 08/02/2021 NEGATIVE  NEGATIVE mg/dL Final   Nitrite 08/02/2021 NEGATIVE  NEGATIVE Final   Leukocytes,Ua 08/02/2021 NEGATIVE  NEGATIVE Final   Performed at [facility], [place] [address] [place], [place] [address]    Allergies: Patient has no known allergies.  PTA Medications: (Not in a hospital admission)   Medical Decision Making  Patient recommended for inpatient psychiatric treatment.  Patient will be admitted to [facility] for continuous assessment while waiting for inpatient bed placement. Discussed plan for inpatient admission with patient; patient says he ponder over it and discuss with provider on day shift.     Recommendations  Based on my evaluation the patient does not appear to have an emergency medical condition.  Ophelia Shoulder, NP 12/30/21  6:13 AM

## 2021-12-30 NOTE — Progress Notes (Signed)
Per Dr. Serafina Mitchell, this patient has been able to contract for safety and will be psych cleared home with resources.    Mariea Clonts, MSW, LCSW-A  11:36 AM 12/30/2021
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
## 2021-12-30 NOTE — Progress Notes (Signed)
Patient is alert and oriented X 4, denies SI, HI and Auditory and visual hallucinations. Patient is logical and coherent in speech. Demeanor is calm and cooperative. Patient is cooperative and appropriate with staff and peers on the observation unit. Patient has no objective evidence of responding to internal stimuli at this time. Patient requesting discharge with community resources for follow up care.

## 2021-12-30 NOTE — ED Notes (Signed)
Pt has been brought on the unit and familiarized with the unit. Pt is lying in bed quietly with eyes closed. Pt has a medical boot at the bedside due to recent injury of his foot.

## 2021-12-30 NOTE — ED Provider Notes (Signed)
FBC/OBS ASAP Discharge Summary  Date and Time: 12/30/2021 11:21 AM  Name: Daniel Marsh  MRN:  [id number]   Discharge Diagnoses:  Final diagnoses:  MDD (major depressive disorder), recurrent severe, without psychosis ([place])    Subjective:   41 year old man with history of PTSD, depression and anxiety who presented to the [facility] behavioral health urgent care on 2/59 by police after they were called by patient's wife her mother.  UDS positive for marijuana ;EtOH 114.  Patient interviewed this morning-he is tearful but cooperative and calm.  He denies SI/HI/AVH.  And he describes his mood is "okay".  He denies SI plan or intent.  He denies HI/AVH.  Patient denies current SI but does admit he has thought about suicide in the past and at that time he had previously had a plan to "jump off a bridge".  Patient denies that he feels like this currently and indicates that he feels like he is safe for discharge.  Patient cites his friends, family and children as reasons why he would not harm himself.  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  Patient indicates that he has received psychiatric treatment in the past.  He states that he was previously in the [employer] and last saw a psychiatrist at this time which he estimates was approximately 7 years ago.  He states that he had previously seen a therapist but that he has not seen this person since August 2022.  Patient indicates that he would like assistance with outpatient referrals to therapy as well as medication management.  Patient states that his primary care physician currently manages his psychiatric medications but states he is interested in seeing a psychiatrist for further medication management.  He indicates that he has been inpatient in a psychiatric hospital before and states "it was a bad experience".  Patient states that he does not want to go inpatient and that he communicated this to overnight  provider.  Patient states that he feels safe for discharge and is agreeable to outpatient treatment.  Patient provides consent for me to call his wife Daniel Marsh for collateral.  Patient states that if his wife had safety concerns about him returning home then he would be agreeable to inpatient treatment.  Patient states that he is also interested in marriage counseling as he feels that the communication could be better between he and his spouse.    See below for details.  Daniel Marsh (patient's wife) was contacted for additional collateral.  She has no immediate safety concerns about patient returning home today.  She requests that he be provided with outpatient resources for medication management and therapy.   After his wife was contacted, spoke to patient and informed him that she has no immediate safety concerns about him returning home.  Discussed with patient that social work will be consulted and that he will be provided with resources for therapy, medication management and marriage counseling upon discharge.  Patient verbalized understanding and was in agreement with the plan.  Collateral- Daniel Marsh (wife) [phone] Called at 16:22 AM He has been going through "manic depression and anxiety for awhile and it has been building up over time". Nothing was abnormal of orutine and he had a mental break down last , I dont know his triggers. Mental breadown described as crying, making comments about not needing to be around anymore. He just kept saying "I want an out" and "there is no use of being  here anymore". He used to have a therapist through [facility]. She would like him to have outpatient psychiatrist and therapist. Dayton Scrape that he needs help to identify triggers. . Has no immediate safety concerns about dc as long as he is agreeable to outpatient therapy; return precautions given.   Stay Summary:   Daniel Marsh is a 41 year old male with psychiatric history of PTSD, anxiety, and  depression.  Patient presented voluntarily via law enforcement to [facility].  Patient presented with chief complaint of worsening depression. Was seen face-to-face and his chart was reviewed by this nurse practitioner.  On assessment, patient is tearful, alert and oriented x4.  He is calm and cooperative.  She is speaking in a normal tone of voice at moderate rate with good eye contact.  Patient's mood is depressed with congruent affect.  His thought process is coherent   Patient reports " I have been in a bad place mentally for a while."  Patient shares that over the past 36 months he has experienced several deaths in his family.  He reports that he has lost his grandfather, 6 aunts/uncles and 2 friends.  He reports that he is experiencing worsening anxiety and depression. He shares that he was in the [employer] for 7 years and worked in [employer]. 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 He reports that on Tuesday he was watching a TV show "[employer]' and a character was being resuscitated but didn't make it and this triggered his PTSD.    Today, he reports that he was in a verbal altercation with his wife while on the way from a football game; he says he believed that his wife may have contacted law enforcement and he was brought here for an evaluation.   He denies suicidal ideation or that he made suicidal statement during altercation  with wife to this Probation officer however he reports having suicidal ideations to TTS counselor. Per TTS counselor, patient states "he's had a plan for a while (to jump off of a specific overpass) and that tonight he was going to attempt to kill himself." Patient admit to hx of suicidal ideation and states "ever since living in [place], I know where to go if I want to do it." He denies history of suicidal attempt or self harming.  He denies homicidal  ideation, hallucination, and paranoia.  He endorses smoking 1 g of marijuana per day and consuming 1 can of beer nightly.  He also reports consuming up to 12 cans of beer "when I am felling very low." He says he drank 12 cans of beer prior to this assessment.  He reports that he resides at home with his 28-year-old son and his wife.   Patient reports that he currently takes Elavil, Valium, prazosin, and Trintellix.  He reports that he is followed by psychologist Dr. Conception Chancy, last appointment August 2022. He says he self increased his dose of Trintellix from 7m to 255msince Tuesday 12/24/21 "because I was struggling."  Patient [facility] The following morning, patient denied SI/HI/AVH and was able to contract for safety.  Patient requested discharge.  See above for additional details about interview date of discharge, collateral being contacted, patient resources being provided.  On my interview, day of discharge, patient is in NAD, alert, oriented, calm, cooperative, and attentive,  with  normal  speech, and behavior although he  is intermittently tearful on interview. Objectively, there is no evidence of psychosis/ mania (able to converse coherently, linear and goal directed thought, no RIS, no distractibility, not pre-occupied, no FOI, etc) nor depression to the point of suicidality (able to concentrate, affect full and reactive, speech normal r/v/t, no psychomotor retardation/agitation, etc). Patient denies SI, plan or intent. He denies HI and psychosis.   Overall, patient appears to be at the point, in the absence of inhibiting or disinhibiting symptoms, where he can successfully move to lesser restrictive setting for care.   Total Time spent with patient: 30 minutes  Past Psychiatric History: MDD, PTSD, anxiety Past Medical History:  Past Medical History:  Diagnosis Date   Anxiety    Arthritis    Depression    Hypertension    Liver disease     Myocardial infarction ([place])    PTSD (post-traumatic stress disorder)    Ulcerative colitis ([place]) 2019    Past Surgical History:  Procedure Laterality Date   ANKLE SURGERY Right 2011   KNEE SURGERY Left 2020   SPINE SURGERY  2013   Lumbar fusion L4-S1   WISDOM TOOTH EXTRACTION     Family History:  Family History  Problem Relation Age of Onset   Osteoarthritis Mother    Depression Mother    Heart attack Mother    Osteoarthritis Father    Depression Father    Depression Brother    Diabetes Maternal Grandmother    Hyperlipidemia Maternal Grandmother    Hypertension Maternal Grandmother    Heart attack Maternal Grandmother    Osteoarthritis Maternal Grandfather    COPD Maternal Grandfather    Kidney disease Maternal Grandfather    Renal cancer Maternal Grandfather    Alcohol abuse Paternal Grandmother    COPD Paternal Grandmother    Diabetes Paternal Grandmother    Hypertension Paternal Grandmother    Lung cancer Paternal Grandmother    Osteoarthritis Paternal Grandfather    Heart attack Paternal Grandfather    Heart failure Paternal Grandfather    Depression Brother    Pancreatic cancer Maternal Aunt    Lung cancer Paternal Aunt    Colon cancer Maternal Uncle    Family Psychiatric History: father, mother and brother with depression per chart Social History:  Social History   Substance and Sexual Activity  Alcohol Use Yes   Alcohol/week: 13.0 standard drinks   Types: 10 Cans of beer, 3 Shots of liquor per week     Social History   Substance and Sexual Activity  Drug Use Yes   Types: Marijuana   Comment: occ    Social History   Socioeconomic History   Marital status: Married    Spouse name: Not on file   Number of children: 3   Years of education: Not on file   Highest education level: Not on file  Occupational History   Not on file  Tobacco Use   Smoking status: Some Days    Types: Cigarettes, Cigars   Smokeless tobacco: Current    Types: Chew   Vaping Use   Vaping Use: Never used  Substance and Sexual Activity   Alcohol use: Yes    Alcohol/week: 13.0 standard drinks    Types: 10 Cans of beer, 3 Shots of liquor per week   Drug use: Yes    Types: Marijuana    Comment: occ   Sexual activity: Yes  Other Topics Concern   Not on file  Social History Narrative   ** Merged History Encounter **       Was in the [employer] for 7 years, required lumbar surgery while in the [employer] and had poor outcome Divorced and re-married 1 yo son    Social Determinants of Radio broadcast assistant Strain: Not on file  Food Insecurity: Not on file  Transportation Needs: Not on file  Physical Activity: Not on file  Stress: Not on file  Social Connections: Not on file   SDOH:  SDOH Screenings   Alcohol Screen: Not on file  Depression (PHQ2-9): Medium Risk   PHQ-2 Score: 20  Financial Resource Strain: Not on file  Food Insecurity: Not on file  Housing: Not on file  Physical Activity: Not on file  Social Connections: Not on file  Stress: Not on file  Tobacco Use: High Risk   Smoking Tobacco Use: Some Days   Smokeless Tobacco Use: Current   Passive Exposure: Not on file  Transportation Needs: Not on file    Tobacco Cessation:  Prescription not provided because: n/a  Current Medications:  Current Facility-Administered Medications  Medication Dose Route Frequency Provider Last Rate Last Admin   acetaminophen (TYLENOL) tablet 650 mg  650 mg Oral Q6H PRN Ajibola, Ene A, NP       alum & mag hydroxide-simeth (MAALOX/MYLANTA) 200-200-20 MG/5ML suspension 30 mL  30 mL Oral Q4H PRN Ajibola, Ene A, NP       hydrOXYzine (ATARAX) tablet 25 mg  25 mg Oral TID PRN Ajibola, Ene A, NP       magnesium hydroxide (MILK OF MAGNESIA) suspension 30 mL  30 mL Oral Daily PRN Ajibola, Ene A, NP       traZODone (DESYREL) tablet 50 mg  50 mg Oral QHS PRN Ajibola, Ene A, NP       Current Outpatient Medications  Medication Sig Dispense Refill    acetaminophen (TYLENOL) 500 MG tablet Take 1,500-2,000 mg by mouth every 6 (six) hours as needed (For foot pain).     amitriptyline (ELAVIL) 25 MG tablet Take 1 tablet (25 mg total) by mouth at bedtime. 30 tablet 5   diazepam (VALIUM) 5 MG tablet Take 1 tablet (5 mg total) by mouth at bedtime as needed for anxiety. 30 tablet 1   dicyclomine (BENTYL) 10 MG capsule Take 1 capsule (10 mg total) by mouth 3 (three) times daily as needed (abdominal pain/ cramping). 90 capsule 3   gabapentin (NEURONTIN) 600 MG tablet Take 2 tablets (1,200 mg total) by mouth 3 (three) times daily. 180 tablet 5   mesalamine (APRISO) 0.375 g 24 hr capsule Take 1 capsule (0.375 g total) by mouth in the morning, at noon, in the evening, and at bedtime. 120 capsule 6   omeprazole (PRILOSEC) 40 MG capsule Take 1 capsule (40 mg total) by mouth daily. 90 capsule 3   ondansetron (ZOFRAN ODT) 4 MG disintegrating tablet Take 1 tablet (4 mg total) by mouth every 8 (eight) hours as needed for nausea or vomiting. 20 tablet 0   prazosin (MINIPRESS) 1 MG capsule Take 2 capsules (2 mg total) by mouth at bedtime. 180 capsule 1   vortioxetine HBr (TRINTELLIX) 20 MG TABS tablet Take 1 tablet (20 mg total) by mouth daily. 90 tablet 1    PTA Medications: (Not in a hospital admission)   Musculoskeletal  Strength & Muscle Tone: within normal limits Gait & Station: normal Patient leans: N/A  Psychiatric Specialty Exam  Presentation  General Appearance: Appropriate  for Environment; Casual  Eye Contact:Good  Speech:Clear and Coherent; Normal Rate  Speech Volume:Normal  Handedness:Right   Mood and Affect  Mood:Dysphoric  Affect:Appropriate; Congruent; Tearful; Other (comment) (dysphoric, intermittently tearful)   Thought Process  Thought Processes:Coherent; Goal Directed; Linear  Descriptions of Associations:Intact  Orientation:Full (Time, Place and Person)  Thought Content:WDL; Logical  Diagnosis of Schizophrenia or  Schizoaffective disorder in past: No    Hallucinations:Hallucinations: None  Ideas of Reference:None  Suicidal Thoughts:Suicidal Thoughts: No  Homicidal Thoughts:Homicidal Thoughts: No   Sensorium  Memory:Immediate Good; Remote Good; Recent Good  Judgment:Fair  Insight:Fair   Executive Functions  Concentration:Good  Attention Span:Good  Recall:Good  Fund of Knowledge:Good  Language:Good   Psychomotor Activity  Psychomotor Activity:Psychomotor Activity: Normal   Assets  Assets:Communication Skills; Desire for Improvement; Housing; Physical Health; Resilience; Social Support   Sleep  Sleep:Sleep: Fair Number of Hours of Sleep: 3   Nutritional Assessment (For OBS and FBC admissions only) Has the patient had a weight loss or gain of 10 pounds or more in the last 3 months?: Yes Has the patient had a decrease in food intake/or appetite?: Yes Does the patient have dental problems?: No Does the patient have eating habits or behaviors that may be indicators of an eating disorder including binging or inducing vomiting?: No Has the patient recently lost weight without trying?: 1 Has the patient been eating poorly because of a decreased appetite?: 0 Malnutrition Screening Tool Score: 1    Physical Exam  Physical Exam Constitutional:      Appearance: Normal appearance. He is normal weight.  HENT:     Head: Normocephalic and atraumatic.  Eyes:     Extraocular Movements: Extraocular movements intact.  Pulmonary:     Effort: Pulmonary effort is normal.  Neurological:     General: No focal deficit present.     Mental Status: He is alert and oriented to person, place, and time.  Psychiatric:        Attention and Perception: Attention and perception normal.        Speech: Speech normal.        Behavior: Behavior normal. Behavior is cooperative.        Thought Content: Thought content normal.   Review of Systems  Constitutional:  Negative for chills and fever.   HENT:  Negative for hearing loss.   Eyes:  Negative for discharge and redness.  Respiratory:  Negative for cough.   Cardiovascular:  Negative for chest pain.  Gastrointestinal:  Negative for abdominal pain.  Musculoskeletal:  Negative for myalgias.  Neurological:  Negative for headaches.  Psychiatric/Behavioral:  Positive for depression. Negative for hallucinations and suicidal ideas. The patient is nervous/anxious.   Blood pressure 121/85, pulse (!) 106, temperature 97.6 F (36.4 C), temperature source Oral, resp. rate 18, SpO2 100 %. There is no height or weight on file to calculate BMI.  Demographic Factors:  Male  Loss Factors: NA  Historical Factors: Impulsivity  Risk Reduction Factors:   Responsible for children under 62 years of age, Sense of responsibility to family, Living with another person, especially a relative, and Positive social support  Continued Clinical Symptoms:  Severe Anxiety and/or Agitation Depression:   Comorbid alcohol abuse/dependence Alcohol/Substance Abuse/Dependencies Previous Psychiatric Diagnoses and Treatments Medical Diagnoses and Treatments/Surgeries  Cognitive Features That Contribute To Risk:  None    Suicide Risk:  Mild:  Suicidal ideation of limited frequency, intensity, duration, and specificity.  There are no identifiable plans, no associated intent, mild dysphoria and  related symptoms, good self-control (both objective and subjective assessment), few other risk factors, and identifiable protective factors, including available and accessible social support.  Plan Of Care/Follow-up recommendations:  Activity:  as tolerated Diet:  regular Other:     Take all medications as prescribed by his/her mental healthcare provider. Report any adverse effects and or reactions from the medicines to your outpatient provider promptly. Do not engage in alcohol and or illegal drug use while on prescription medicines. In the event of worsening  symptoms, call the crisis hotline, 911 and or go to the nearest ED for appropriate evaluation and treatment of symptoms. follow-up with your primary care provider for your other medical issues, concerns and or health care needs.  Sw was consulted for assistance with follow up -requested resources for marriage counseling, outpatient therapy and medication management. Patient was provided with follow up information regarding psychiatric outpatient resources in [place] with the assistance of SW prior to discharge.       Disposition: self care/home  Ival Bible, MD 12/30/2021, 11:21 AM

## 2021-12-30 NOTE — ED Notes (Signed)
Pt is currently sleeping, no distress noted, environmental check complete, will continue to monitor patient for safety.

## 2021-12-30 NOTE — ED Notes (Signed)
Patient refused to eat lunch said he was not hungry.

## 2021-12-30 NOTE — ED Notes (Signed)
Patient is alert and oriented X 3, denies SI, HI and Audtiory and Visual hallucinations at time of discharge. Patient received an After Visit Summary with community resources provided. Patient received all belongings from [facility] locker. Patient left with significant other in private vehicle.

## 2021-12-30 NOTE — ED Notes (Signed)
Triaging and Rounding other MHT doing one on one

## 2021-12-30 NOTE — Progress Notes (Signed)
RN received report, patient currently asleep with even and unlabored respirations at this time. Nursing staff will continue to monitor.

## 2021-12-30 NOTE — Discharge Instructions (Addendum)
Please contact one of the following facilities to start medication management and therapy services:  ? ?[facility] ?[address]  ?[place], [place] [address] ?[phone]  ? ?[facility]  ?[address] ?[place], [place] [address] ?[phone] ? ?[facility]  ?[address], [place], [place] [address] ?[phone] ? ?[facility]  ?[address]  ?[place], [place] [address] ?[phone] ? ?[facility]  ?[address] ?[place], [place] [address] ?[phone] ? ?[facility]  ?[address],  ?[place],  [address] ?[phone]  ?

## 2021-12-30 NOTE — Progress Notes (Signed)
[facility]/BMU LCSW Progress Note   12/30/2021    10:57 AM  Daniel Marsh   [id number]   Type of Contact and Topic:  Psychiatric Bed Placement   Pt accepted to [facility] 304-1   Patient meets inpatient criteria per Thomes Lolling, NP   The attending provider will be Janine Limbo, MD  Call report to [phone]    Jerry Caras, RN @ [facility] notified.     Pt scheduled  to arrive at [place] [facility] at 12 Noon.    Mariea Clonts, MSW, LCSW-A  10:58 AM 12/30/2021

## 2022-01-16 ENCOUNTER — Encounter (HOSPITAL_COMMUNITY): Payer: Self-pay | Admitting: Psychiatry

## 2022-01-16 ENCOUNTER — Telehealth (HOSPITAL_BASED_OUTPATIENT_CLINIC_OR_DEPARTMENT_OTHER): Payer: BC Managed Care – PPO | Admitting: Psychiatry

## 2022-01-16 ENCOUNTER — Telehealth (HOSPITAL_COMMUNITY): Payer: Self-pay | Admitting: Psychiatry

## 2022-01-16 ENCOUNTER — Other Ambulatory Visit: Payer: Self-pay

## 2022-01-16 VITALS — Wt 212.0 lb

## 2022-01-16 DIAGNOSIS — F331 Major depressive disorder, recurrent, moderate: Secondary | ICD-10-CM | POA: Diagnosis not present

## 2022-01-16 DIAGNOSIS — F4310 Post-traumatic stress disorder, unspecified: Secondary | ICD-10-CM

## 2022-01-16 MED ORDER — AMITRIPTYLINE HCL 50 MG PO TABS: 50.0000 mg | ORAL_TABLET | Freq: Every day | ORAL | 0 refills | Status: DC
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
## 2022-01-16 NOTE — Telephone Encounter (Signed)
D:  Dr. Adele Schilder referred pt to [facility].  A:  Placed call and oriented pt.  Pt states he would like to think about it and call his insurance co to verify his benefits.  Encouraged pt to call the case manager back if he has further questions or would like to sign up.  Inform Dr. Adele Schilder.  R:  Pt receptive.

## 2022-01-19 ENCOUNTER — Telehealth (HOSPITAL_COMMUNITY): Payer: Self-pay | Admitting: Physician Assistant

## 2022-01-19 NOTE — BH Assessment (Signed)
Care Management - [place] Follow Up Discharges   Writer attempted to make contact with patient today and was unsuccessful.  Phone just rang, unable to leave a message.   Per chart review, patient has followed up with an appointment for medication management on 01-16-2022 with Dr. Adele Schilder.

## 2022-01-20 DIAGNOSIS — S93602A Unspecified sprain of left foot, initial encounter: Secondary | ICD-10-CM | POA: Diagnosis not present

## 2022-01-28 DIAGNOSIS — M79672 Pain in left foot: Secondary | ICD-10-CM | POA: Diagnosis not present

## 2022-02-03 DIAGNOSIS — S93602D Unspecified sprain of left foot, subsequent encounter: Secondary | ICD-10-CM | POA: Diagnosis not present

## 2022-02-06 ENCOUNTER — Other Ambulatory Visit: Payer: Self-pay

## 2022-02-06 ENCOUNTER — Encounter (HOSPITAL_COMMUNITY): Payer: Self-pay | Admitting: Psychiatry

## 2022-02-06 ENCOUNTER — Telehealth (HOSPITAL_BASED_OUTPATIENT_CLINIC_OR_DEPARTMENT_OTHER): Payer: BC Managed Care – PPO | Admitting: Psychiatry

## 2022-02-06 DIAGNOSIS — F4310 Post-traumatic stress disorder, unspecified: Secondary | ICD-10-CM

## 2022-02-06 DIAGNOSIS — F331 Major depressive disorder, recurrent, moderate: Secondary | ICD-10-CM

## 2022-02-06 MED ORDER — AMITRIPTYLINE HCL 75 MG PO TABS: 75.0000 mg | ORAL_TABLET | Freq: Every day | ORAL | 1 refills | Status: DC
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
## 2022-02-10 DIAGNOSIS — M25561 Pain in right knee: Secondary | ICD-10-CM | POA: Diagnosis not present

## 2022-02-10 DIAGNOSIS — M25562 Pain in left knee: Secondary | ICD-10-CM | POA: Diagnosis not present

## 2022-02-11 ENCOUNTER — Encounter: Payer: Self-pay | Admitting: Registered Nurse

## 2022-02-11 ENCOUNTER — Encounter: Payer: BC Managed Care – PPO | Attending: Registered Nurse | Admitting: Registered Nurse

## 2022-02-11 ENCOUNTER — Other Ambulatory Visit: Payer: Self-pay

## 2022-02-11 VITALS — BP 137/86 | HR 94 | Ht 72.0 in | Wt 223.4 lb

## 2022-02-11 DIAGNOSIS — M961 Postlaminectomy syndrome, not elsewhere classified: Secondary | ICD-10-CM | POA: Insufficient documentation

## 2022-02-11 DIAGNOSIS — Z760 Encounter for issue of repeat prescription: Secondary | ICD-10-CM | POA: Insufficient documentation

## 2022-02-11 DIAGNOSIS — Z9181 History of falling: Secondary | ICD-10-CM | POA: Insufficient documentation

## 2022-02-11 DIAGNOSIS — Y92009 Unspecified place in unspecified non-institutional (private) residence as the place of occurrence of the external cause: Secondary | ICD-10-CM | POA: Insufficient documentation

## 2022-02-11 DIAGNOSIS — M4726 Other spondylosis with radiculopathy, lumbar region: Secondary | ICD-10-CM | POA: Diagnosis not present

## 2022-02-11 DIAGNOSIS — W19XXXD Unspecified fall, subsequent encounter: Secondary | ICD-10-CM | POA: Insufficient documentation

## 2022-02-11 DIAGNOSIS — M25511 Pain in right shoulder: Secondary | ICD-10-CM | POA: Diagnosis not present

## 2022-02-11 DIAGNOSIS — R269 Unspecified abnormalities of gait and mobility: Secondary | ICD-10-CM | POA: Insufficient documentation

## 2022-02-11 DIAGNOSIS — G479 Sleep disorder, unspecified: Secondary | ICD-10-CM | POA: Diagnosis not present

## 2022-02-11 DIAGNOSIS — M545 Low back pain, unspecified: Secondary | ICD-10-CM | POA: Diagnosis not present

## 2022-02-11 DIAGNOSIS — M25512 Pain in left shoulder: Secondary | ICD-10-CM | POA: Diagnosis not present

## 2022-02-11 DIAGNOSIS — M47816 Spondylosis without myelopathy or radiculopathy, lumbar region: Secondary | ICD-10-CM

## 2022-02-11 DIAGNOSIS — G8929 Other chronic pain: Secondary | ICD-10-CM | POA: Diagnosis not present

## 2022-02-11 DIAGNOSIS — R531 Weakness: Secondary | ICD-10-CM | POA: Diagnosis not present

## 2022-02-11 DIAGNOSIS — M25562 Pain in left knee: Secondary | ICD-10-CM | POA: Insufficient documentation

## 2022-02-11 DIAGNOSIS — M5416 Radiculopathy, lumbar region: Secondary | ICD-10-CM

## 2022-02-11 DIAGNOSIS — M25552 Pain in left hip: Secondary | ICD-10-CM | POA: Diagnosis not present

## 2022-02-11 DIAGNOSIS — M25561 Pain in right knee: Secondary | ICD-10-CM | POA: Insufficient documentation

## 2022-02-11 MED ORDER — GABAPENTIN 600 MG PO TABS: 1200.0000 mg | ORAL_TABLET | Freq: Three times a day (TID) | ORAL | 5 refills | Status: DC

## 2022-02-11 NOTE — Progress Notes (Signed)
? ?Subjective:  ? ? Patient ID: Daniel Marsh, male    DOB: 04-21-1981, 41 y.o.   MRN: [id number] ? ?HPI: Daniel Marsh is a 41 y.o. male who returns for follow up appointment for chronic pain and medication refill. He states his pain is located in his bilateral shoulders, lower back pain radiating into his left lower extremity and bilateral knee pain. He reports increase intensity of bilateral knee pain Ortho following and he is scheduled for MRI he states. He rates his pain 9. His current exercise regime is walking and performing stretching exercises. ? ?[name] states he fell in January he was walking and his right knee buckled and he fell on his right side. His wife helped him up he states. He also reports he has followed up with orthopedics.  Educated on fall prevention, he verbalizes understanding.  ? ?  ? ?Pain Inventory ?Average Pain 9 ?Pain Right Now 9 ?My pain is constant, sharp, burning, dull, stabbing, tingling, and aching ? ?In the last 24 hours, has pain interfered with the following? ?General activity 7 ?Relation with others 7 ?Enjoyment of life 7 ?What TIME of day is your pain at its worst? morning , daytime, evening, and night ?Sleep (in general) Poor ? ?Pain is worse with: walking, bending, sitting, inactivity, and standing ?Pain improves with: rest, heat/ice, therapy/exercise, medication, and TENS ?Relief from Meds: 5 ? ?Family History  ?Problem Relation Age of Onset  ? Osteoarthritis Mother   ? Depression Mother   ? Heart attack Mother   ? Osteoarthritis Father   ? Depression Father   ? Depression Brother   ? Diabetes Maternal Grandmother   ? Hyperlipidemia Maternal Grandmother   ? Hypertension Maternal Grandmother   ? Heart attack Maternal Grandmother   ? Osteoarthritis Maternal Grandfather   ? COPD Maternal Grandfather   ? Kidney disease Maternal Grandfather   ? Renal cancer Maternal Grandfather   ? Alcohol abuse Paternal Grandmother   ? COPD Paternal Grandmother   ? Diabetes  Paternal Grandmother   ? Hypertension Paternal Grandmother   ? Lung cancer Paternal Grandmother   ? Osteoarthritis Paternal Grandfather   ? Heart attack Paternal Grandfather   ? Heart failure Paternal Grandfather   ? Depression Brother   ? Pancreatic cancer Maternal Aunt   ? Lung cancer Paternal Aunt   ? Colon cancer Maternal Uncle   ? ?Social History  ? ?Socioeconomic History  ? Marital status: Married  ?  Spouse name: Not on file  ? Number of children: 3  ? Years of education: Not on file  ? Highest education level: Not on file  ?Occupational History  ? Not on file  ?Tobacco Use  ? Smoking status: Some Days  ?  Types: Cigarettes, Cigars  ? Smokeless tobacco: Current  ?  Types: Chew  ?Vaping Use  ? Vaping Use: Never used  ?Substance and Sexual Activity  ? Alcohol use: Yes  ?  Alcohol/week: 13.0 standard drinks  ?  Types: 10 Cans of beer, 3 Shots of liquor per week  ? Drug use: Yes  ?  Types: Marijuana  ?  Comment: occ  ? Sexual activity: Yes  ?Other Topics Concern  ? Not on file  ?Social History Narrative  ? ** Merged History Encounter **  ?    ? Was in the [employer] for 7 years, required lumbar surgery while in the [employer] and had poor outcome ?Divorced and re-married ?110 yo son ?  ? ?Social Determinants of Health  ? ?  Financial Resource Strain: Not on file  ?Food Insecurity: Not on file  ?Transportation Needs: Not on file  ?Physical Activity: Not on file  ?Stress: Not on file  ?Social Connections: Not on file  ? ?Past Surgical History:  ?Procedure Laterality Date  ? ANKLE SURGERY Right 2011  ? KNEE SURGERY Left 2020  ? [place] SURGERY  2013  ? Lumbar fusion L4-S1  ? WISDOM TOOTH EXTRACTION    ? ?Past Surgical History:  ?Procedure Laterality Date  ? ANKLE SURGERY Right 2011  ? KNEE SURGERY Left 2020  ? [place] SURGERY  2013  ? Lumbar fusion L4-S1  ? WISDOM TOOTH EXTRACTION    ? ?Past Medical History:  ?Diagnosis Date  ? Anxiety   ? Arthritis   ? Depression   ? Hypertension   ? Liver disease   ? Myocardial infarction  [facility])   ? PTSD (post-traumatic stress disorder)   ? Ulcerative colitis ([place]) 2019  ? ?BP 137/86   Pulse 94   Ht 6' (1.829 m)   Wt 223 lb 6.4 oz (101.3 kg)   SpO2 100%   BMI 30.30 kg/m?  ? ?Opioid Risk Score:   ?Fall Risk Score:  `1 ? ?Depression screen PHQ 2/9 ? ?Depression screen [facility] 2/9 02/11/2022 08/13/2021 05/17/2021 03/19/2021 11/05/2020 10/08/2020 05/30/2020  ?Decreased Interest 3 1 1 1 3 3 2   ?Down, Depressed, Hopeless 3 1 1 1 3 3 3   ?PHQ - 2 Score 6 2 2 2 6 6 5   ?Altered sleeping - - - 1 3 1 3   ?Tired, decreased energy - - - - 3 3 2   ?Change in appetite - - - - 3 3 3   ?Feeling bad or failure about yourself  - - - - 3 3 2   ?Trouble concentrating - - - - 3 3 2   ?Moving slowly or fidgety/restless - - - - 2 2 2   ?Suicidal thoughts - - - - 1 1 1   ?PHQ-9 Score - - - 3 24 22 20   ?Difficult doing work/chores - - - - Very difficult Very difficult Somewhat difficult  ?Some encounter information is confidential and restricted. Go to Review Flowsheets activity to see all data.  ?  ? ?Review of Systems  ?Constitutional: Negative.   ?HENT: Negative.    ?Eyes: Negative.   ?Respiratory: Negative.    ?Cardiovascular: Negative.   ?Gastrointestinal: Negative.   ?Endocrine: Negative.   ?Genitourinary: Negative.   ?Musculoskeletal:  Positive for back pain and gait problem.  ?Skin: Negative.   ?Allergic/Immunologic: Negative.   ?Neurological:  Positive for weakness.  ?Hematological: Negative.   ?Psychiatric/Behavioral:  Positive for dysphoric mood and sleep disturbance.   ? ?   ?Objective:  ? Physical Exam ?Vitals and nursing note reviewed.  ?Constitutional:   ?   Appearance: Normal appearance.  ?Cardiovascular:  ?   Rate and Rhythm: Normal rate and regular rhythm.  ?   Pulses: Normal pulses.  ?   Heart sounds: Normal heart sounds.  ?Pulmonary:  ?   Effort: Pulmonary effort is normal.  ?   Breath sounds: Normal breath sounds.  ?Musculoskeletal:  ?   Cervical back: Normal range of motion and neck supple.  ?   Comments: Normal Muscle  Bulk and Muscle Testing Reveals:  ?Upper Extremities: Full ROM and Muscle Strength 5/5 ?Bilateral AC Joint Tenderness ?Lumbar Paraspinal Tenderness: L-4-L-5 ?Lower Extremities: Full ROM and Muscle Strength 5/5 ?Arises from Table Slowly using cane for support ?Narrow Based  Gait  ?   ?  Skin: ?   General: Skin is warm and dry.  ?Neurological:  ?   Mental Status: He is alert and oriented to person, place, and time.  ?Psychiatric:     ?   Mood and Affect: Mood normal.     ?   Behavior: Behavior normal.  ? ? ? ? ?   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 Continue current medication regimen. Continue to monitor. 02/11/2022 ?Facet Arthropathy, Lumbar: Continue HEP as Tolerated. Continue current medication regimen. Continue to monitor. 02/11/2022 ?Left Lumbar Radiculitis: Continue Gabapentin. Continue to Monitor. 02/11/2022 ?Abnormality of Gait: Continue using cane. Continue to monitor. 02/11/2022 ?Chronic Bilateral Knee pain: L>R: Orthopedist Following  Continue to monitor 09314/2023 ?Chronic Left Hip Pain: No complaints today. Continue to Monitor. 03/14/202 ?Sleep Disturbance: Continue Elavil at H. Continue to Monitor. 02/11/2022 ?Bilateral Shoulder Pain: RX : X-rays. Continue to Monitor.  ?9. Fall at home: S/P in January: Ortho Following. Educated on Fall Prevention, he verbalizes understanding.  ?  ?F/U in 6 months  ?  ? ?

## 2022-02-15 DIAGNOSIS — M25562 Pain in left knee: Secondary | ICD-10-CM | POA: Diagnosis not present

## 2022-02-21 ENCOUNTER — Encounter: Payer: BC Managed Care – PPO | Admitting: Physician Assistant

## 2022-02-24 DIAGNOSIS — M25562 Pain in left knee: Secondary | ICD-10-CM | POA: Diagnosis not present

## 2022-02-28 ENCOUNTER — Encounter: Payer: Self-pay | Admitting: Physician Assistant

## 2022-02-28 ENCOUNTER — Ambulatory Visit (INDEPENDENT_AMBULATORY_CARE_PROVIDER_SITE_OTHER): Payer: BC Managed Care – PPO | Admitting: Physician Assistant

## 2022-02-28 VITALS — BP 132/84 | HR 80 | Temp 98.0°F | Ht 72.0 in | Wt 216.8 lb

## 2022-02-28 DIAGNOSIS — F4310 Post-traumatic stress disorder, unspecified: Secondary | ICD-10-CM

## 2022-02-28 DIAGNOSIS — I219 Acute myocardial infarction, unspecified: Secondary | ICD-10-CM

## 2022-02-28 DIAGNOSIS — F331 Major depressive disorder, recurrent, moderate: Secondary | ICD-10-CM

## 2022-02-28 DIAGNOSIS — K51911 Ulcerative colitis, unspecified with rectal bleeding: Secondary | ICD-10-CM | POA: Diagnosis not present

## 2022-02-28 DIAGNOSIS — F419 Anxiety disorder, unspecified: Secondary | ICD-10-CM

## 2022-02-28 DIAGNOSIS — Z0001 Encounter for general adult medical examination with abnormal findings: Secondary | ICD-10-CM

## 2022-02-28 DIAGNOSIS — I10 Essential (primary) hypertension: Secondary | ICD-10-CM

## 2022-02-28 DIAGNOSIS — E782 Mixed hyperlipidemia: Secondary | ICD-10-CM

## 2022-02-28 LAB — CBC WITH DIFFERENTIAL/PLATELET
Basophils Absolute: 0 10*3/uL (ref 0.0–0.1)
Basophils Relative: 0.5 % (ref 0.0–3.0)
Eosinophils Absolute: 0.1 10*3/uL (ref 0.0–0.7)
Eosinophils Relative: 1.5 % (ref 0.0–5.0)
HCT: 40.9 % (ref 39.0–52.0)
Hemoglobin: 13.3 g/dL (ref 13.0–17.0)
Lymphocytes Relative: 27.5 % (ref 12.0–46.0)
Lymphs Abs: 1.5 10*3/uL (ref 0.7–4.0)
MCHC: 32.6 g/dL (ref 30.0–36.0)
MCV: 82.8 fl (ref 78.0–100.0)
Monocytes Absolute: 0.7 10*3/uL (ref 0.1–1.0)
Monocytes Relative: 12.5 % — ABNORMAL HIGH (ref 3.0–12.0)
Neutro Abs: 3.1 10*3/uL (ref 1.4–7.7)
Neutrophils Relative %: 58 % (ref 43.0–77.0)
Platelets: 243 10*3/uL (ref 150.0–400.0)
RBC: 4.93 Mil/uL (ref 4.22–5.81)
RDW: 20.1 % — ABNORMAL HIGH (ref 11.5–15.5)
WBC: 5.3 10*3/uL (ref 4.0–10.5)

## 2022-02-28 MED ORDER — DIAZEPAM 5 MG PO TABS: 5.0000 mg | ORAL_TABLET | Freq: Every evening | ORAL | 1 refills | Status: DC | PRN

## 2022-02-28 MED ORDER — VORTIOXETINE HBR 20 MG PO TABS: 20.0000 mg | ORAL_TABLET | Freq: Every day | ORAL | 1 refills | Status: DC

## 2022-02-28 MED ORDER — PRAZOSIN HCL 2 MG PO CAPS: 2.0000 mg | ORAL_CAPSULE | Freq: Every day | ORAL | 1 refills | Status: DC

## 2022-02-28 MED ORDER — ATORVASTATIN CALCIUM 20 MG PO TABS: 20.0000 mg | ORAL_TABLET | Freq: Every day | ORAL | 0 refills | Status: DC

## 2022-02-28 MED ORDER — PRAZOSIN HCL 1 MG PO CAPS: ORAL_CAPSULE | ORAL | 1 refills | Status: DC

## 2022-02-28 NOTE — Patient Instructions (Signed)
It was great to see you! ? ?Start lipitor 20 mg daily -- follow-up in 3 months ? ?Call gastroenterology!!! ? ?Please go to the lab for blood work.  ? ?Our office will call you with your results unless you have chosen to receive results via MyChart. ? ?If your blood work is normal we will follow-up each year for physicals and as scheduled for chronic medical problems. ? ?If anything is abnormal we will treat accordingly and get you in for a follow-up. ? ?Take care, ? ?Aldona Bar ?  ?

## 2022-02-28 NOTE — Progress Notes (Signed)
? ?Subjective:  ?  ?Daniel Marsh is a 41 y.o. male and is here for a comprehensive physical exam. ? ?HPI ? ?Health Maintenance Due  ?Topic Date Due  ? COVID-19 Vaccine (3 - Booster for Pfizer series) 05/21/2020  ? ? ?Acute Concerns: ?None reported.  ? ?Chronic Issues: ?Depression/Anxiety ?Daniel Marsh is currently compliant with taking elavil 75 mg nightly, trintellix 20 mg daily, and valium 5 mg as needed with no complications. Since our previous visit, he has started following up with Dr. Adele Schilder, behavioral health. According to pt, Dr. Adele Schilder will be taking over management of his Elavil medication only. At this time he is managing well and will need a refill of his other medications. Denies SI/HI.  ? ? ?Sleep Disturbance; PTSD ?Pt is currently compliant with taking prazosin 2 mg nightly with no adverse effects. Although he previously found this medication beneficial, he believes this needs to be increased due to his wife informing him that he is still having nightmares in the middle of the night. Pt states he is not able to remember these upon waking up, but believes they are still occurring. Despite this he is tolerating well.  ? ? ?HLD; Hx of MI  ?Pt is currently not on any medication for this issue. Despite trying to maintain a well balanced diet and increasing exercise, he is interested in starting a medication especially considering his hx of MI. Denies CP or SOB.  ? ? ?HTN ?Daniel Marsh was previously taking micardis while in the service, but has since stopped use of the medication due to improvement of readings. Patient denies chest pain, SOB, blurred vision, dizziness, unusual headaches, lower leg swelling.Denies excessive caffeine intake, stimulant usage, excessive alcohol intake, or increase in salt consumption.  ? ?BP Readings from Last 3 Encounters:  ?02/28/22 132/84  ?02/11/22 137/86  ?10/17/21 130/80  ? ? ?Ulcerative Colitis  ?At this time pt is currently compliant with taking prilosec 40 mg daily, apriso  0.375 g four times daily, and Bentyl 10 mg TID as needed with no complications. Although he has been compliant with these medications he is still experiencing rectal bleeding. Due to this, he followed up with Nicoletta Ba, gastroenterology and was given a prednisone taper as well as instructed to follow up with Dr. Bryan Lemma, gastroenterology for further evaluation.  ? ?Upon further discussion, it was found that pt did not follow up with gastroenterology but he did compete the prednisone taper. At this time he is planning to re-schedule a follow up visit for further evaluation of continued rectal bleeding.  ? ? ?Health Maintenance: ?Immunizations -- Covid- UTD ?Influenza- UTD ?Tdap- UTD;2018 ?Colonoscopy -- UTD;2021 ?Dentistry- UTD ?Ophthalmology- Not updated ?PSA -- No results found for: PSA1, PSA ?Diet -- Eats all food groups  ?Sleep habits -- See above  ?Exercise -- As able, walks often  ?Weight -- Stable ?Weight history ?Wt Readings from Last 10 Encounters:  ?02/11/22 223 lb 6.4 oz (101.3 kg)  ?10/17/21 212 lb (96.2 kg)  ?10/07/21 210 lb 8 oz (95.5 kg)  ?09/25/21 206 lb (93.4 kg)  ?09/25/21 204 lb (92.5 kg)  ?08/13/21 204 lb (92.5 kg)  ?08/02/21 200 lb (90.7 kg)  ?06/25/21 203 lb 3.2 oz (92.2 kg)  ?05/17/21 203 lb (92.1 kg)  ?03/19/21 213 lb (96.6 kg)  ? ?There is no height or weight on file to calculate BMI. ?Mood -- Stable ?Tobacco use --  ?Tobacco Use: High Risk  ? Smoking Tobacco Use: Some Days  ? Smokeless Tobacco Use: Current  ?  Passive Exposure: Not on file  ?  ?Alcohol use ---  reports current alcohol use of about 13.0 standard drinks per week.  ? ? ?  02/11/2022  ?  3:30 PM  ?Depression screen PHQ 2/9  ?Decreased Interest 3  ?Down, Depressed, Hopeless 3  ?PHQ - 2 Score 6  ? ? ? ?Other providers/specialists: ?Patient Care Team: ?Inda Coke, PA as PCP - General (Physician Assistant) ? ? ?PMHx, SurgHx, SocialHx, Medications, and Allergies were reviewed in the Visit Navigator and updated as  appropriate.  ? ?Past Medical History:  ?Diagnosis Date  ? Anxiety   ? Arthritis   ? Depression   ? Hypertension   ? Liver disease   ? Myocardial infarction [facility])   ? PTSD (post-traumatic stress disorder)   ? Ulcerative colitis ([place]) 2019  ? ? ? ?Past Surgical History:  ?Procedure Laterality Date  ? ANKLE SURGERY Right 2011  ? KNEE SURGERY Left 2020  ? [place] SURGERY  2013  ? Lumbar fusion L4-S1  ? WISDOM TOOTH EXTRACTION    ? ? ? ?Family History  ?Problem Relation Age of Onset  ? Osteoarthritis Mother   ? Depression Mother   ? Heart attack Mother   ? Osteoarthritis Father   ? Depression Father   ? Depression Brother   ? Diabetes Maternal Grandmother   ? Hyperlipidemia Maternal Grandmother   ? Hypertension Maternal Grandmother   ? Heart attack Maternal Grandmother   ? Osteoarthritis Maternal Grandfather   ? COPD Maternal Grandfather   ? Kidney disease Maternal Grandfather   ? Renal cancer Maternal Grandfather   ? Alcohol abuse Paternal Grandmother   ? COPD Paternal Grandmother   ? Diabetes Paternal Grandmother   ? Hypertension Paternal Grandmother   ? Lung cancer Paternal Grandmother   ? Osteoarthritis Paternal Grandfather   ? Heart attack Paternal Grandfather   ? Heart failure Paternal Grandfather   ? Depression Brother   ? Pancreatic cancer Maternal Aunt   ? Lung cancer Paternal Aunt   ? Colon cancer Maternal Uncle   ? ? ?Social History  ? ?Tobacco Use  ? Smoking status: Some Days  ?  Types: Cigarettes, Cigars  ? Smokeless tobacco: Current  ?  Types: Chew  ?Vaping Use  ? Vaping Use: Never used  ?Substance Use Topics  ? Alcohol use: Yes  ?  Alcohol/week: 13.0 standard drinks  ?  Types: 10 Cans of beer, 3 Shots of liquor per week  ? Drug use: Yes  ?  Types: Marijuana  ?  Comment: occ  ? ? ?Review of Systems:  ? ?Review of Systems  ?Constitutional:  Negative for chills, fever, malaise/fatigue and weight loss.  ?HENT:  Negative for hearing loss, sinus pain and sore throat.   ?Respiratory:  Negative for cough and  hemoptysis.   ?Cardiovascular:  Negative for chest pain, palpitations, leg swelling and PND.  ?Gastrointestinal:  Negative for abdominal pain, constipation, diarrhea, heartburn, nausea and vomiting.  ?Genitourinary:  Negative for dysuria, frequency and urgency.  ?Musculoskeletal:  Negative for back pain, myalgias and neck pain.  ?Skin:  Negative for itching and rash.  ?Neurological:  Negative for dizziness, tingling, seizures and headaches.  ?Endo/Heme/Allergies:  Negative for polydipsia.  ?Psychiatric/Behavioral:  Negative for depression. The patient is not nervous/anxious.   ? ?Objective:  ? ?There were no vitals filed for this visit. ?There is no height or weight on file to calculate BMI. ? ?General Appearance:  Alert, cooperative, no distress, appears stated age  ?Head:  Normocephalic, without obvious abnormality, atraumatic  ?Eyes:  PERRL, conjunctiva/corneas clear, EOM's intact, fundi benign, both eyes       ?Ears:  Normal TM's and external ear canals, both ears  ?Nose: Nares normal, septum midline, mucosa normal, no drainage    or sinus tenderness  ?Throat: Lips, mucosa, and tongue normal; teeth and gums normal  ?Neck: Supple, symmetrical, trachea midline, no adenopathy; thyroid:  No enlargement/tenderness/nodules; no carotit bruit or JVD  ?Back:   Symmetric, no curvature, ROM normal, no CVA tenderness  ?Lungs:   Clear to auscultation bilaterally, respirations unlabored  ?Chest wall:  No tenderness or deformity  ?Heart:  Regular rate and rhythm, S1 and S2 normal, no murmur, rub   or gallop  ?Abdomen:   Soft, non-tender, bowel sounds active all four quadrants, no masses, no organomegaly  ?Extremities: Extremities normal, atraumatic, no cyanosis or edema  ?Prostate: Not done.   ?Skin: Skin color, texture, turgor normal, no rashes or lesions  ?Lymph nodes: Cervical, supraclavicular, and axillary nodes normal  ?Neurologic: CNII-XII grossly intact. Normal strength, sensation and reflexes throughout   ? ? ?Assessment/Plan:  ? ?Encounter for routine adult physical exam with abnormal findings ?Today patient counseled on age appropriate routine health concerns for screening and prevention, each reviewed and up to date or declined. Im

## 2022-03-04 ENCOUNTER — Telehealth: Payer: Self-pay

## 2022-03-04 NOTE — Telephone Encounter (Signed)
PA for trintellix has been approved.  ? ?PA Case: [id number], Status: Approved, Coverage Starts on: 03/03/2022 12:00 AM, Coverage Ends on: 03/04/2023 12:00 AM. ? ?Key: [id number] ?

## 2022-03-24 DIAGNOSIS — M170 Bilateral primary osteoarthritis of knee: Secondary | ICD-10-CM | POA: Diagnosis not present

## 2022-03-31 DIAGNOSIS — M170 Bilateral primary osteoarthritis of knee: Secondary | ICD-10-CM | POA: Diagnosis not present

## 2022-04-07 DIAGNOSIS — M170 Bilateral primary osteoarthritis of knee: Secondary | ICD-10-CM | POA: Diagnosis not present

## 2022-04-09 ENCOUNTER — Telehealth (HOSPITAL_BASED_OUTPATIENT_CLINIC_OR_DEPARTMENT_OTHER): Payer: BC Managed Care – PPO | Admitting: Psychiatry

## 2022-04-09 ENCOUNTER — Other Ambulatory Visit: Payer: Self-pay

## 2022-04-09 ENCOUNTER — Encounter (HOSPITAL_COMMUNITY): Payer: Self-pay | Admitting: Psychiatry

## 2022-04-09 VITALS — Wt 210.0 lb

## 2022-04-09 DIAGNOSIS — F331 Major depressive disorder, recurrent, moderate: Secondary | ICD-10-CM

## 2022-04-09 DIAGNOSIS — F4310 Post-traumatic stress disorder, unspecified: Secondary | ICD-10-CM

## 2022-04-09 DIAGNOSIS — F1210 Cannabis abuse, uncomplicated: Secondary | ICD-10-CM | POA: Diagnosis not present

## 2022-04-09 MED ORDER — AMITRIPTYLINE HCL 75 MG PO TABS: 75.0000 mg | ORAL_TABLET | Freq: Every day | ORAL | 0 refills | Status: DC

## 2022-04-09 MED ORDER — MESALAMINE ER 0.375 G PO CP24: 375.0000 mg | ORAL_CAPSULE | Freq: Four times a day (QID) | ORAL | 6 refills | Status: DC

## 2022-04-09 NOTE — Progress Notes (Signed)
Virtual Visit via Video Note ? ?I connected with [name]-Bristol Panning on 04/09/22 at 10:40 AM EDT by a video enabled telemedicine application and verified that I am speaking with the correct person using two identifiers. ? ?Location: ?Patient: Home ?Provider: Home Office ?  ?I discussed the limitations of evaluation and management by telemedicine and the availability of in person appointments. The patient expressed understanding and agreed to proceed. ? ?History of Present Illness: ?Patient is evaluated by video session.  Recently his PCP increased his Minipress.  He is also also getting Valium, Trintellix from primary care.  There are days that he struggle with irritability, stress and he feels he is on edge.  However he denies any violence and does not lash out towards people.  He is tolerating his medication and reported no tremor or shakes or any EPS.  He did not get to see pain management but recently received injection in his knee joints.  Today his pain is better.  However he still has pain in his back and spine.  He is getting along better with the family member.  He lives with his wife and 56-year-old son.  The older kids lives with her ex pati's appointment with Joan Flores.  He continues to smoke marijuana and he feels that helps his anxiety and pain.  He had tried stopping but he feels his anxiety and pain get worse. ? ?Past Psychiatric History: ?H/O depression, PTSD and anxiety. Had treatment in [place] from 2011-2006. Tried Prozac and Wellbutrin but did not like, Zoloft caused sexual side effects and Cymbalta worked for a while. H/O group therapy.  No history of suicidal attempt or inpatient treatment. H/O DUI and incarceration for 9 months in [place] for the charges of sexual assault and indecent exposure. ? ?Psychiatric Specialty Exam: ?Physical Exam  ?Review of Systems  ?Weight 210 lb (95.3 kg).There is no height or weight on file to calculate BMI.  ?General Appearance: Casual  ?Eye Contact:   Fair  ?Speech:  Clear and Coherent  ?Volume:  Decreased  ?Mood:  Anxious  ?Affect:  Congruent  ?Thought Process:  Goal Directed  ?Orientation:  Full (Time, Place, and Person)  ?Thought Content:  Rumination  ?Suicidal Thoughts:  No  ?Homicidal Thoughts:  No  ?Memory:  Immediate;   Good ?Recent;   Good ?Remote;   Good  ?Judgement:  Intact  ?Insight:  Present  ?Psychomotor Activity:  Normal  ?Concentration:  Concentration: Good and Attention Span: Fair  ?Recall:  Good  ?Fund of Knowledge:  Good  ?Language:  Good  ?Akathisia:  No  ?Handed:  Right  ?AIMS (if indicated):     ?Assets:  Communication Skills ?Desire for Improvement ?Housing ?Social Support  ?ADL's:  Intact  ?Cognition:  WNL  ?Sleep:   5 hrs, better than before  ? ? ? ? ?Assessment and Plan: ?Major depressive disorder, recurrent.  PTSD. ? ?Recently his physician increased Minipress and he is now taking 2 mg.  He is also getting Valium Trintellix from PCP.  Like to give more time to medication.  We will continue amitriptyline 75 mg at bedtime.  Discussed medication side effects and at this time he does not have any tremor or shakes or any EPS.  I encouraged to keep therapy with [facility] shoulder.  Recommended to call us back if is any questions or concerns.  Follow up in 3 months. ? ?Follow Up Instructions: ? ?  ?I discussed the assessment and treatment plan with the patient. The patient was  provided an opportunity to ask questions and all were answered. The patient agreed with the plan and demonstrated an understanding of the instructions. ?  ?The patient was advised to call back or seek an in-person evaluation if the symptoms worsen or if the condition fails to improve as anticipated. ? ?I provided 23 minutes of non-face-to-face time during this encounter. ? ? ?Kathlee Nations, MD  ?

## 2022-05-03 IMAGING — CR DG CHEST 2V
2 series · 2 of 2 positions shown · non-contrast
Comparison: None.

CLINICAL DATA: Chest pain on inspiration

EXAM:
CHEST - 2 VIEW

[w chest pa]
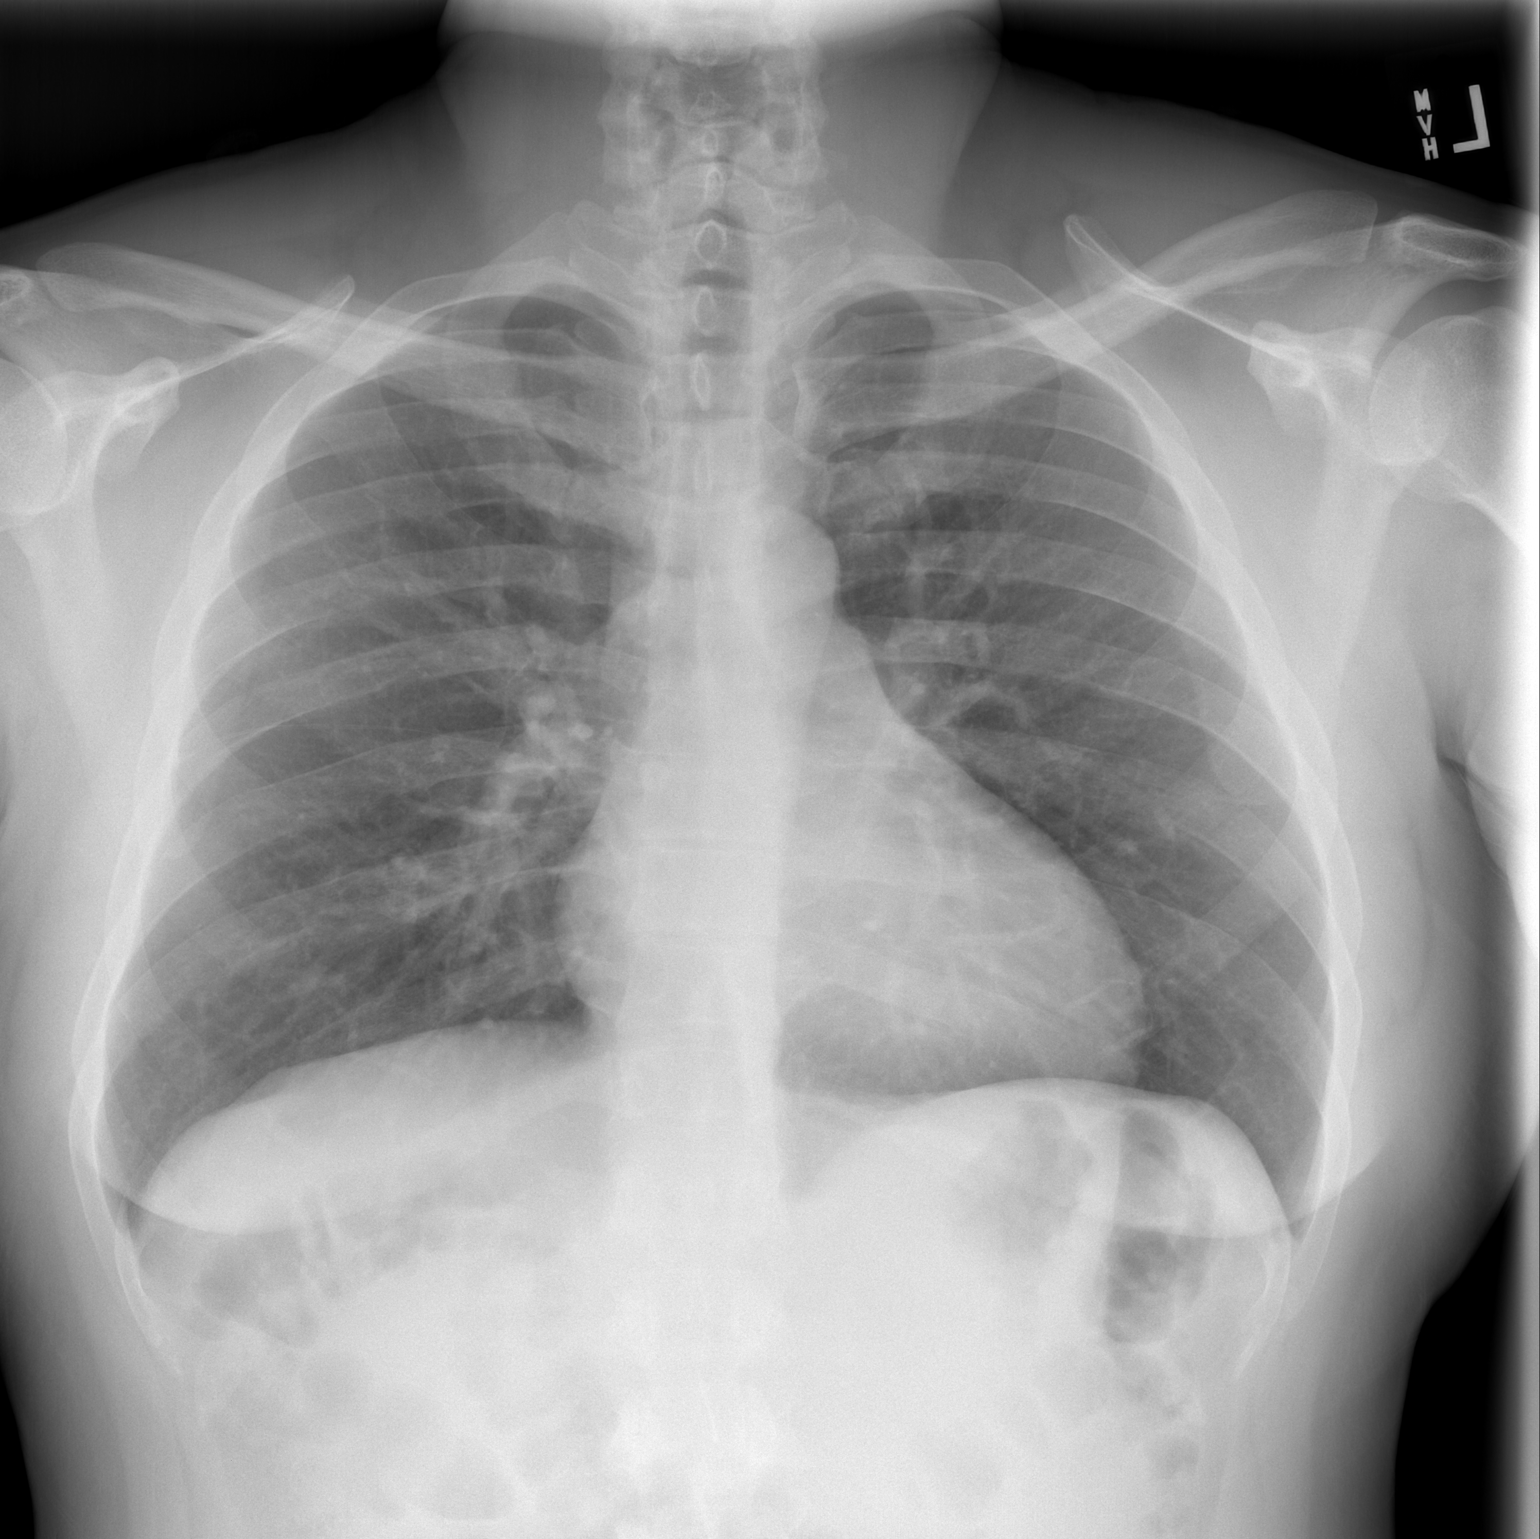

[w chest lat]
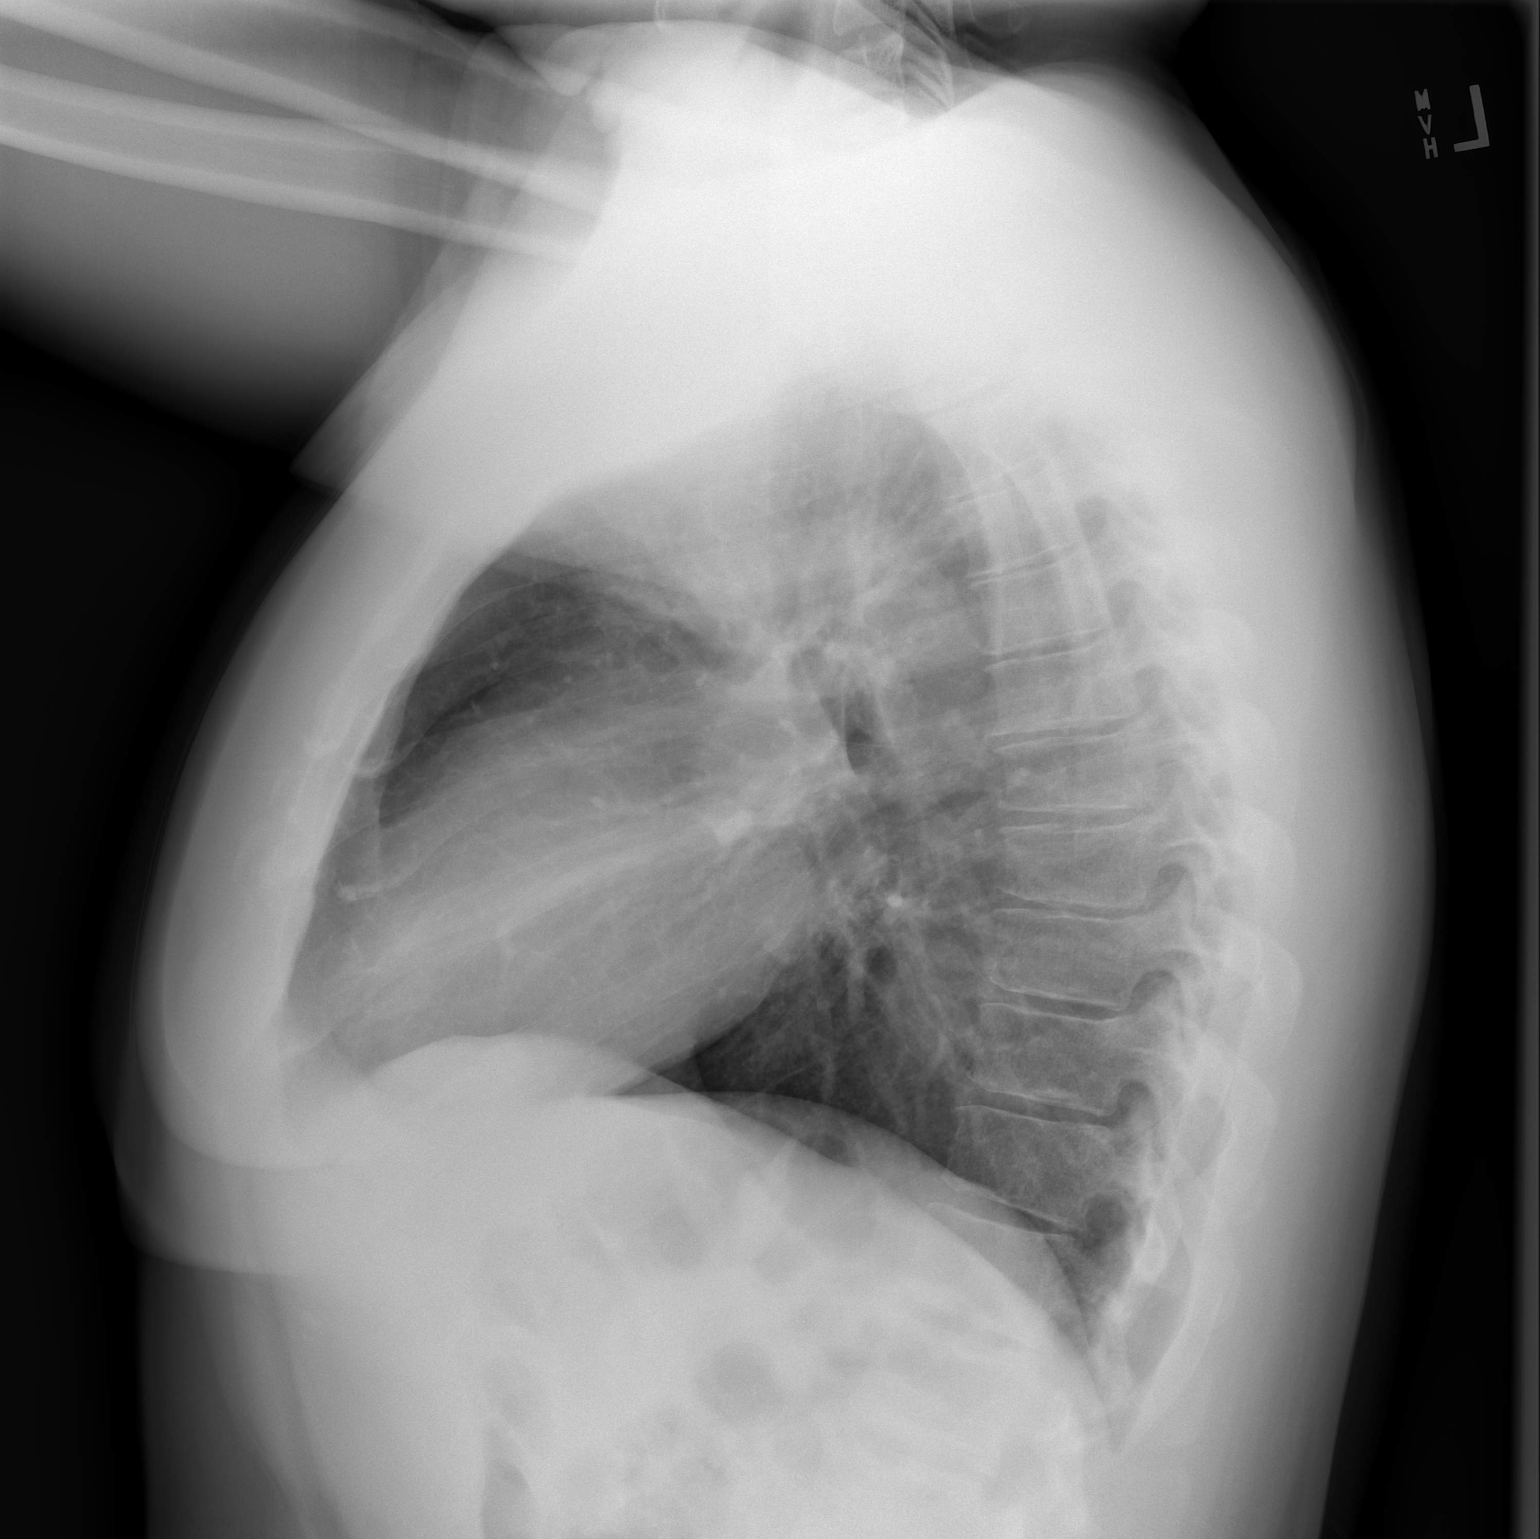

[2 of 2 positions shown; findings below may reference images not displayed]

FINDINGS: The heart size and mediastinal contours are within normal limits.
Both lungs are clear. The visualized skeletal structures are
unremarkable.
IMPRESSION: No active cardiopulmonary disease.

## 2022-05-14 ENCOUNTER — Ambulatory Visit (INDEPENDENT_AMBULATORY_CARE_PROVIDER_SITE_OTHER): Payer: BC Managed Care – PPO | Admitting: Psychologist

## 2022-05-14 DIAGNOSIS — Z634 Disappearance and death of family member: Secondary | ICD-10-CM | POA: Diagnosis not present

## 2022-05-14 DIAGNOSIS — F331 Major depressive disorder, recurrent, moderate: Secondary | ICD-10-CM

## 2022-05-14 DIAGNOSIS — F4310 Post-traumatic stress disorder, unspecified: Secondary | ICD-10-CM

## 2022-05-14 DIAGNOSIS — F1010 Alcohol abuse, uncomplicated: Secondary | ICD-10-CM | POA: Diagnosis not present
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
## 2022-05-14 NOTE — Progress Notes (Signed)
                Eldean Nanna, PsyD 

## 2022-05-15 ENCOUNTER — Ambulatory Visit: Payer: BC Managed Care – PPO | Admitting: Psychologist

## 2022-06-02 ENCOUNTER — Ambulatory Visit: Payer: BC Managed Care – PPO | Admitting: Physician Assistant

## 2022-06-02 NOTE — Progress Notes (Incomplete)
Daniel Marsh is a 41 y.o. male here for a {New prob or follow up:31724}.  SCRIBE STATEMENT  History of Present Illness:   No chief complaint on file.   HPI Anxiety/Depression Patient is currently compliant with taking Elavil 75 mg nightly,Trintellix 20 mg daily, and Valium 5 mg as needed with no complications  HLD   Past Medical History:  Diagnosis Date   Anemia    Anxiety    Arthritis    Depression    Hypertension    Liver disease    Myocardial infarction ([place])    PTSD (post-traumatic stress disorder)    Ulcerative colitis ([place]) 2019     Social History   Tobacco Use   Smoking status: Every Day    Packs/day: 0.25    Years: 15.00    Total pack years: 3.75    Types: Cigarettes, Cigars   Smokeless tobacco: Current    Types: Chew   Tobacco comments:    I am usually a social smoker  Vaping Use   Vaping Use: Never used  Substance Use Topics   Alcohol use: Yes   Drug use: Yes    Types: Marijuana    Comment: Was using to self medicate for depression and anxiety    Past Surgical History:  Procedure Laterality Date   ANKLE SURGERY Right 2011   KNEE SURGERY Left 2020   SPINE SURGERY  2013   Lumbar fusion L4-S1   WISDOM TOOTH EXTRACTION      Family History  Problem Relation Age of Onset   Osteoarthritis Mother    Depression Mother    Heart attack Mother 41   Osteoarthritis Father    Depression Father    Bladder Cancer Father    Prostate cancer Father    Depression Brother    Depression Brother    Diabetes Maternal Grandmother    Hyperlipidemia Maternal Grandmother    Hypertension Maternal Grandmother    Heart attack Maternal Grandmother    Osteoarthritis Maternal Grandfather    COPD Maternal Grandfather    Kidney disease Maternal Grandfather    Renal cancer Maternal Grandfather    Alcohol abuse Paternal Grandmother    COPD Paternal Grandmother    Diabetes Paternal Grandmother    Hypertension Paternal Grandmother    Lung cancer Paternal  Grandmother    Osteoarthritis Paternal Grandfather    Heart attack Paternal Grandfather    Heart failure Paternal Grandfather    Pancreatic cancer Maternal Aunt    Colon cancer Maternal Uncle    Lung cancer Paternal Aunt     No Known Allergies  Current Medications:   Current Outpatient Medications:    acetaminophen (TYLENOL) 500 MG tablet, Take 1,500-2,000 mg by mouth every 6 (six) hours as needed (For foot pain)., Disp: , Rfl:    amitriptyline (ELAVIL) 75 MG tablet, Take 1 tablet (75 mg total) by mouth at bedtime., Disp: 90 tablet, Rfl: 0   atorvastatin (LIPITOR) 20 MG tablet, Take 1 tablet (20 mg total) by mouth daily., Disp: 90 tablet, Rfl: 0   diazepam (VALIUM) 5 MG tablet, Take 1 tablet (5 mg total) by mouth at bedtime as needed for anxiety., Disp: 30 tablet, Rfl: 1   dicyclomine (BENTYL) 10 MG capsule, Take 1 capsule (10 mg total) by mouth 3 (three) times daily as needed (abdominal pain/ cramping)., Disp: 90 capsule, Rfl: 3   gabapentin (NEURONTIN) 600 MG tablet, Take 2 tablets (1,200 mg total) by mouth 3 (three) times daily., Disp: 180 tablet, Rfl: 5  mesalamine (APRISO) 0.375 g 24 hr capsule, Take 1 capsule (0.375 g total) by mouth in the morning, at noon, in the evening, and at bedtime., Disp: 120 capsule, Rfl: 6   omeprazole (PRILOSEC) 40 MG capsule, Take 1 capsule (40 mg total) by mouth daily., Disp: 90 capsule, Rfl: 3   ondansetron (ZOFRAN ODT) 4 MG disintegrating tablet, Take 1 tablet (4 mg total) by mouth every 8 (eight) hours as needed for nausea or vomiting., Disp: 20 tablet, Rfl: 0   prazosin (MINIPRESS) 1 MG capsule, Take 1 mg at night. To be taken with a 2 mg capsule for a total of 3 mg nightly., Disp: 90 capsule, Rfl: 1   prazosin (MINIPRESS) 2 MG capsule, Take 1 capsule (2 mg total) by mouth at bedtime. To be taken with the 1 mg capsule for a total of 3 mg., Disp: 90 capsule, Rfl: 1   vortioxetine HBr (TRINTELLIX) 20 MG TABS tablet, Take 1 tablet (20 mg total) by mouth  daily., Disp: 90 tablet, Rfl: 1   Review of Systems:   ROS Negative unless otherwise specified per HPI.   Vitals:   There were no vitals filed for this visit.   There is no height or weight on file to calculate BMI.  Physical Exam:   Physical Exam  Assessment and Plan:   @DIAGLIST @    I,Savera Zaman,acting as a scribe for Inda Coke, PA.,have documented all relevant documentation on the behalf of Inda Coke, PA,as directed by  Inda Coke, PA while in the presence of Inda Coke, [place].   ***  Inda Coke, PA-C

## 2022-06-09 ENCOUNTER — Telehealth: Payer: Self-pay | Admitting: Registered Nurse

## 2022-06-09 ENCOUNTER — Ambulatory Visit: Payer: BC Managed Care – PPO | Admitting: Physician Assistant

## 2022-06-09 ENCOUNTER — Encounter: Payer: Self-pay | Admitting: Physician Assistant

## 2022-06-09 VITALS — BP 130/90 | HR 72 | Temp 98.5°F | Ht 72.0 in | Wt 219.4 lb

## 2022-06-09 DIAGNOSIS — E782 Mixed hyperlipidemia: Secondary | ICD-10-CM | POA: Diagnosis not present

## 2022-06-09 DIAGNOSIS — E538 Deficiency of other specified B group vitamins: Secondary | ICD-10-CM | POA: Insufficient documentation

## 2022-06-09 DIAGNOSIS — G8929 Other chronic pain: Secondary | ICD-10-CM

## 2022-06-09 DIAGNOSIS — F4310 Post-traumatic stress disorder, unspecified: Secondary | ICD-10-CM | POA: Diagnosis not present

## 2022-06-09 DIAGNOSIS — R251 Tremor, unspecified: Secondary | ICD-10-CM | POA: Diagnosis not present

## 2022-06-09 DIAGNOSIS — M5441 Lumbago with sciatica, right side: Secondary | ICD-10-CM

## 2022-06-09 DIAGNOSIS — F419 Anxiety disorder, unspecified: Secondary | ICD-10-CM

## 2022-06-09 DIAGNOSIS — F331 Major depressive disorder, recurrent, moderate: Secondary | ICD-10-CM

## 2022-06-09 LAB — TSH: TSH: 0.6 u[IU]/mL (ref 0.35–5.50)

## 2022-06-09 LAB — VITAMIN B12: Vitamin B-12: 170 pg/mL — ABNORMAL LOW (ref 211–911)

## 2022-06-09 LAB — LIPID PANEL
Cholesterol: 198 mg/dL (ref 0–200)
HDL: 76.8 mg/dL (ref >39.00)
LDL Cholesterol: 99 mg/dL (ref 0–99)
NonHDL: 121.63
Total CHOL/HDL Ratio: 3
Triglycerides: 111 mg/dL (ref 0.0–149.0)
VLDL: 22.2 mg/dL (ref 0.0–40.0)

## 2022-06-09 MED ORDER — DIAZEPAM 5 MG PO TABS: 5.0000 mg | ORAL_TABLET | Freq: Every evening | ORAL | 1 refills | Status: DC | PRN

## 2022-06-09 NOTE — Patient Instructions (Signed)
It was great to see you!  I will message [name] about escalating your care  I have placed new referral to talk therapy  We will update blood work and I will let you know when to follow-up  IF tremor worsens or new symptoms develop, let us know   Take care,  Inda Coke PA-C

## 2022-06-09 NOTE — Progress Notes (Signed)
Daniel Marsh is a 41 y.o. male here for a follow up of a pre-existing problem.  History of Present Illness:   Chief Complaint  Patient presents with   Anxiety   Depression   Hyperlipidemia   Present with wife.  HPI  Anxiety/Depression/PTSD 3 months follow-up. He is currently compliant with taking Trintellix 20 mg daily, Elavil 75 mg daily, and Valium 5 mg as needed. He is taking 2 mg prazosin daily. He is following up with Dr. Adele Schilder behavioral health. At this time, he is managing well and has found medications working well for him. Denies any adverse side effects. He has had some increased mood for the past few months but symptoms are manageable. Denies SI/HI. Would like to trial increase frequency of Valium to twice weekly. Notes that he took this daily in the [employer].  Hyperlipidemia  Patient is currently compliant with taking Lipitor 20 mg daily with no complications. Tolerating well with no adverse side effects. Denies any concerning sx.   Chronic back pain Patient states he has been dealing with this for a while. However, his symptoms seems to be getting progressively worse. He is currently taking Gabapentin 1200 TID. He is tolerating this well without any issue. Denies bowel/bladder incontinence, saddle anesthesia.  Tremors  Patient has had noticed some numbness in his bilateral hands. States his hand feel shaky most of the time. This has been going on for several years but his wife has noticed for the past 2 years. Had tremors mostly in hands. Denies family hx of parkinson's disease. Does get some numbness and tingling in his hands as well.   Past Medical History:  Diagnosis Date   Anemia    Anxiety    Arthritis    Depression    Hypertension    Liver disease    Myocardial infarction ([place])    PTSD (post-traumatic stress disorder)    Ulcerative colitis ([place]) 2019     Social History   Tobacco Use   Smoking status: Every Day    Packs/day: 0.25    Years: 15.00     Total pack years: 3.75    Types: Cigarettes, Cigars   Smokeless tobacco: Current    Types: Chew   Tobacco comments:    I am usually a social smoker  Vaping Use   Vaping Use: Never used  Substance Use Topics   Alcohol use: Yes   Drug use: Yes    Types: Marijuana    Comment: Was using to self medicate for depression and anxiety    Past Surgical History:  Procedure Laterality Date   ANKLE SURGERY Right 2011   KNEE SURGERY Left 2020   SPINE SURGERY  2013   Lumbar fusion L4-S1   WISDOM TOOTH EXTRACTION      Family History  Problem Relation Age of Onset   Osteoarthritis Mother    Depression Mother    Heart attack Mother 46   Osteoarthritis Father    Depression Father    Bladder Cancer Father    Prostate cancer Father    Depression Brother    Depression Brother    Diabetes Maternal Grandmother    Hyperlipidemia Maternal Grandmother    Hypertension Maternal Grandmother    Heart attack Maternal Grandmother    Osteoarthritis Maternal Grandfather    COPD Maternal Grandfather    Kidney disease Maternal Grandfather    Renal cancer Maternal Grandfather    Alcohol abuse Paternal Grandmother    COPD Paternal Grandmother    Diabetes Paternal  Grandmother    Hypertension Paternal Grandmother    Lung cancer Paternal Grandmother    Osteoarthritis Paternal Grandfather    Heart attack Paternal Grandfather    Heart failure Paternal Grandfather    Pancreatic cancer Maternal Aunt    Colon cancer Maternal Uncle    Lung cancer Paternal Aunt     No Known Allergies  Current Medications:   Current Outpatient Medications:    acetaminophen (TYLENOL) 500 MG tablet, Take 1,500-2,000 mg by mouth every 6 (six) hours as needed (For foot pain)., Disp: , Rfl:    amitriptyline (ELAVIL) 75 MG tablet, Take 1 tablet (75 mg total) by mouth at bedtime., Disp: 90 tablet, Rfl: 0   atorvastatin (LIPITOR) 20 MG tablet, Take 1 tablet (20 mg total) by mouth daily., Disp: 90 tablet, Rfl: 0   diazepam  (VALIUM) 5 MG tablet, Take 1 tablet (5 mg total) by mouth at bedtime as needed for anxiety., Disp: 30 tablet, Rfl: 1   dicyclomine (BENTYL) 10 MG capsule, Take 1 capsule (10 mg total) by mouth 3 (three) times daily as needed (abdominal pain/ cramping)., Disp: 90 capsule, Rfl: 3   gabapentin (NEURONTIN) 600 MG tablet, Take 2 tablets (1,200 mg total) by mouth 3 (three) times daily., Disp: 180 tablet, Rfl: 5   mesalamine (APRISO) 0.375 g 24 hr capsule, Take 1 capsule (0.375 g total) by mouth in the morning, at noon, in the evening, and at bedtime., Disp: 120 capsule, Rfl: 6   omeprazole (PRILOSEC) 40 MG capsule, Take 1 capsule (40 mg total) by mouth daily., Disp: 90 capsule, Rfl: 3   ondansetron (ZOFRAN ODT) 4 MG disintegrating tablet, Take 1 tablet (4 mg total) by mouth every 8 (eight) hours as needed for nausea or vomiting., Disp: 20 tablet, Rfl: 0   prazosin (MINIPRESS) 2 MG capsule, Take 1 capsule (2 mg total) by mouth at bedtime. To be taken with the 1 mg capsule for a total of 3 mg., Disp: 90 capsule, Rfl: 1   vortioxetine HBr (TRINTELLIX) 20 MG TABS tablet, Take 1 tablet (20 mg total) by mouth daily., Disp: 90 tablet, Rfl: 1   Review of Systems:   ROS Negative unless otherwise specified per HPI.   Vitals:   Vitals:   06/09/22 1339  BP: 130/90  Pulse: 72  Temp: 98.5 F (36.9 C)  TempSrc: Temporal  SpO2: 97%  Weight: 219 lb 6.1 oz (99.5 kg)  Height: 6' (1.829 m)     Body mass index is 29.75 kg/m.  Physical Exam:   Physical Exam Vitals and nursing note reviewed.  Constitutional:      General: He is not in acute distress.    Appearance: He is well-developed. He is not ill-appearing or toxic-appearing.  Cardiovascular:     Rate and Rhythm: Normal rate and regular rhythm.     Pulses: Normal pulses.     Heart sounds: Normal heart sounds, S1 normal and S2 normal.  Pulmonary:     Effort: Pulmonary effort is normal.     Breath sounds: Normal breath sounds.  Skin:    General:  Skin is warm and dry.  Neurological:     Mental Status: He is alert.     GCS: GCS eye subscore is 4. GCS verbal subscore is 5. GCS motor subscore is 6.     Cranial Nerves: Cranial nerves 2-12 are intact.     Sensory: Sensation is intact.     Motor: Motor function is intact.  Psychiatric:  Speech: Speech normal.        Behavior: Behavior normal. Behavior is cooperative.     Assessment and Plan:   Mixed hyperlipidemia Update lipid panel and adjust lipitor 20 mg daily as indicated  Tremor Not visible on my exam Neuro exam without focal deficit Due to numbness/tingling, will update B12 to r/o deficiency, low threshold to start B12 injections If blood work benign will refer to neuro If any new/worsening sx, will refer to neuro  PTSD (post-traumatic stress disorder); Anxiety; Moderate episode of recurrent major depressive disorder ([place]) Uncontrolled Needs a new talk therapist -- referral placed for patient Continue trintellix 20 mg and elavil 75 mg as advised May increase valium to twice weekly -- rx sent Follow-up in 3-6 months, sooner if concerns I discussed with patient that if they develop any SI, to tell someone immediately and seek medical attention. N0 active SI on discussion today  Chronic midline low back pain with right-sided sciatica Uncontrolled Has an appt with Pain Mgmt in a month, I will reach out to them proactively to let them know his sx are quite uncontrolled If new neuro sx develop, he was advised to go to the ER  I,Savera Zaman,acting as a scribe for Inda Coke, PA.,have documented all relevant documentation on the behalf of Inda Coke, PA,as directed by  Inda Coke, PA while in the presence of Inda Coke, [place].   I, Inda Coke, [place], have reviewed all documentation for this visit. The documentation on 06/09/22 for the exam, diagnosis, procedures, and orders are all accurate and complete.   Inda Coke, PA-C

## 2022-06-09 NOTE — Telephone Encounter (Signed)
Placed a call to Mr. Ladnier,  Received a message from his PCP regarding his pain.  He is scheduled to come in on tomorrow at 3:00, he verbalizes understanding.

## 2022-06-10 ENCOUNTER — Ambulatory Visit
Admission: RE | Admit: 2022-06-10 | Discharge: 2022-06-10 | Disposition: A | Discharge status: Home / Self Care | Payer: BC Managed Care – PPO | Source: Ambulatory Visit | Attending: Registered Nurse | Admitting: Registered Nurse

## 2022-06-10 ENCOUNTER — Encounter: Payer: BC Managed Care – PPO | Attending: Registered Nurse | Admitting: Registered Nurse

## 2022-06-10 ENCOUNTER — Encounter: Payer: Self-pay | Admitting: Registered Nurse

## 2022-06-10 VITALS — BP 146/83 | HR 74 | Ht 72.0 in | Wt 221.2 lb

## 2022-06-10 DIAGNOSIS — M47816 Spondylosis without myelopathy or radiculopathy, lumbar region: Secondary | ICD-10-CM | POA: Diagnosis not present

## 2022-06-10 DIAGNOSIS — M961 Postlaminectomy syndrome, not elsewhere classified: Secondary | ICD-10-CM

## 2022-06-10 DIAGNOSIS — M5416 Radiculopathy, lumbar region: Secondary | ICD-10-CM | POA: Diagnosis not present

## 2022-06-10 DIAGNOSIS — M545 Low back pain, unspecified: Secondary | ICD-10-CM

## 2022-06-10 DIAGNOSIS — G479 Sleep disorder, unspecified: Secondary | ICD-10-CM | POA: Diagnosis not present

## 2022-06-10 DIAGNOSIS — M25511 Pain in right shoulder: Secondary | ICD-10-CM | POA: Diagnosis not present

## 2022-06-10 DIAGNOSIS — M25512 Pain in left shoulder: Secondary | ICD-10-CM | POA: Diagnosis not present

## 2022-06-10 DIAGNOSIS — G8929 Other chronic pain: Secondary | ICD-10-CM | POA: Diagnosis not present

## 2022-06-10 DIAGNOSIS — R269 Unspecified abnormalities of gait and mobility: Secondary | ICD-10-CM

## 2022-06-10 NOTE — Progress Notes (Signed)
Subjective:    Patient ID: Daniel Marsh, male    DOB: 10-Jan-1981, 41 y.o.   MRN: [id number]  HPI: Daniel Marsh is a 41 y.o. male whose PCP reached out to this provider yesterday stating Daniel Marsh is having increase intensity of pain, and ask if I could reach out to Daniel Marsh. This provider called Daniel Marsh and he was scheduled for an appointment today, he verbalizes understanding.  He returns for follow up appointment for increase intensity of Lower back pain with radiculopathy and chronic pain. He states his pain is located in his lower back radiating into his left buttock and left lower extremity. He rates his pain 10. His current exercise regime is walking short distances with his cane.    Daniel Marsh was seen in Urgent Care on 12/30/2021, UDS was ordered and positive for Marijuana. He admits "he  smokes Marijuana because it helps his PTSD, Anxiety and Pain".   Lumbar X-ray ordered today, unable to order Steroids due to his Ulcerative Colitis he reports.  He will be scheduled for an appointment with [name] to discuss injection, he agrees with plan and verbalizes understanding.   Dr [name] Note: 01/03/2021 Patient with pmh/psh of UC, PTSD, MI, HTN, OA, anxiety/depression, right ankle surgery, L4-S1 lumbar fusion presents with low back pain. Back pain started in 2012 after he was moving an heavy object fracturing bones.    1. Chronic mechanical low back pain - multifactorial: Failed back syndrome, spondylosis, facet arthropathy             MRI L-spine showing 1. Prior ALIF at L4-5 with residual mild narrowing of the lateral recesses without neural impingement. 2. Mild disc bulge with small central disc protrusion at L3-4 with resultant mild lateral recess stenosis without neural impingement. 3. Mild bilateral L3 and L4 foraminal narrowing related to mild degenerative spondylosis and facet hypertrophy.   Pain Inventory Average Pain 7 Pain Right Now 10 My  pain is sharp, burning, dull, stabbing, tingling, and aching  In the last 24 hours, has pain interfered with the following? General activity 7 Relation with others 7 Enjoyment of life 9 What TIME of day is your pain at its worst? morning , daytime, evening, and night Sleep (in general) Poor  Pain is worse with: walking, sitting, inactivity, standing, and some activites Pain improves with: rest, heat/ice, therapy/exercise, pacing activities, medication, and TENS Relief from Meds: 3  Family History  Problem Relation Age of Onset   Osteoarthritis Mother    Depression Mother    Heart attack Mother 91   Osteoarthritis Father    Depression Father    Bladder Cancer Father    Prostate cancer Father    Depression Brother    Depression Brother    Diabetes Maternal Grandmother    Hyperlipidemia Maternal Grandmother    Hypertension Maternal Grandmother    Heart attack Maternal Grandmother    Osteoarthritis Maternal Grandfather    COPD Maternal Grandfather    Kidney disease Maternal Grandfather    Renal cancer Maternal Grandfather    Alcohol abuse Paternal Grandmother    COPD Paternal Grandmother    Diabetes Paternal Grandmother    Hypertension Paternal Grandmother    Lung cancer Paternal Grandmother    Osteoarthritis Paternal Grandfather    Heart attack Paternal Grandfather    Heart failure Paternal Grandfather    Pancreatic cancer Maternal Aunt    Colon cancer Maternal Uncle    Lung cancer Paternal Aunt    Social History  Socioeconomic History   Marital status: Married    Spouse name: Not on file   Number of children: 3   Years of education: Not on file   Highest education level: Not on file  Occupational History   Not on file  Tobacco Use   Smoking status: Every Day    Packs/day: 0.25    Years: 15.00    Total pack years: 3.75    Types: Cigarettes, Cigars   Smokeless tobacco: Current    Types: Chew   Tobacco comments:    I am usually a social smoker  Vaping Use    Vaping Use: Never used  Substance and Sexual Activity   Alcohol use: Yes   Drug use: Yes    Types: Marijuana    Comment: Was using to self medicate for depression and anxiety   Sexual activity: Yes    Birth control/protection: I.U.D.    Comment: My wife currently has the [facility] IUD  Other Topics Concern   Not on file  Social History Narrative   ** Merged History Encounter **       Was in the [employer] for 7 years, required lumbar surgery while in the [employer] and had poor outcome Divorced and re-married 78 yo son    Social Determinants of Radio broadcast assistant Strain: Not on file  Food Insecurity: Not on file  Transportation Needs: Not on file  Physical Activity: Not on file  Stress: Not on file  Social Connections: Not on file   Past Surgical History:  Procedure Laterality Date   ANKLE SURGERY Right 2011   KNEE SURGERY Left 2020   [place]  2013   Lumbar fusion L4-S1   WISDOM TOOTH EXTRACTION     Past Surgical History:  Procedure Laterality Date   ANKLE SURGERY Right 2011   KNEE SURGERY Left 2020   [place]  2013   Lumbar fusion L4-S1   WISDOM TOOTH EXTRACTION     Past Medical History:  Diagnosis Date   Anemia    Anxiety    Arthritis    Depression    Hypertension    Liver disease    Myocardial infarction ([name])    PTSD (post-traumatic stress disorder)    Ulcerative colitis ([name]) 2019   BP (!) 146/83   Pulse 74   Ht 6' (1.829 m)   Wt 221 lb 3.2 oz (100.3 kg)   SpO2 99%   BMI 30.00 kg/m   Opioid Risk Score:   Fall Risk Score:  `1  Depression screen [facility] 2/9     06/10/2022    3:03 PM 06/09/2022    2:17 PM 02/11/2022    3:30 PM 12/30/2021    4:13 AM 08/13/2021    3:41 PM 05/17/2021    9:13 AM 03/19/2021   10:18 AM  Depression screen PHQ 2/9  Decreased Interest 3 2 3  1 1 1   Down, Depressed, Hopeless 3 3 3  1 1 1   PHQ - 2 Score 6 5 6  2 2 2   Altered sleeping  3     1  Tired, decreased energy  3       Change in appetite  3        Feeling bad or failure about yourself   3       Trouble concentrating  2       Moving slowly or fidgety/restless  2       Suicidal thoughts  1  PHQ-9 Score  22     3  Difficult doing work/chores  Very difficult          Information is confidential and restricted. Go to Review Flowsheets to unlock data.      Review of Systems  Musculoskeletal:  Positive for back pain.       Pain going down left leg to foot Back of right upper leg pain Bilateral side pain  All other systems reviewed and are negative.     Objective:   Physical Exam Vitals and nursing note reviewed.  Constitutional:      Appearance: Normal appearance.  Cardiovascular:     Rate and Rhythm: Normal rate and regular rhythm.     Pulses: Normal pulses.     Heart sounds: Normal heart sounds.  Pulmonary:     Effort: Pulmonary effort is normal.     Breath sounds: Normal breath sounds.  Musculoskeletal:     Cervical back: Normal range of motion and neck supple.     Comments: Normal Muscle Bulk and Muscle Testing Reveals:  Upper Extremities: Full ROM and Muscle Strength 5/5 Lumbar Hypersensitivity L>R Lower Extremities: Right Lower Extremity: Full ROM and Muscle Strength 5/5 Left Lower Extremity: Decreased ROM and Muscle Strength 4/5 Left Lower Extremity Flexion Produces Pain into his Lumbar, Left hip and left lower extremity. With Facial Grimacing noted.  Arises from chair slowly using cane  Antalgic  Gait     Skin:    General: Skin is warm and dry.  Neurological:     Mental Status: He is alert and oriented to person, place, and time.  Psychiatric:        Mood and Affect: Mood normal.        Behavior: Behavior normal.         Assessment & Plan:  Acute Exacerbation of chronic low back Pain: RX: Lumbar X-ray; Scheduled an appointment with [name] to discuss Injection, he agrees with plan and verbalizes understanding. Mr. Adinolfi states he is unable to take steroids due to his Ulcerative Colitis. We  will continue to monitor.  Failed Back Syndrome: Continue HEP as Tolerated. Continue current medication regimen. Continue to monitor. 06/10/2022 Facet Arthropathy, Lumbar: Continue HEP as Tolerated. Continue current medication regimen. Continue to monitor. 06/10/2022 Left Lumbar Radiculitis: Continue Gabapentin at this time. We discuss changing his Gabapentin to Lyrica, will discuss with pharmacists and MD. He verbalizes understanding. . Continue to Monitor. 06/10/2022 Abnormality of Gait: Continue using cane. Continue to monitor. 06/10/2022 Chronic Bilateral Knee pain: L>R: Orthopedist Following  Continue to monitor 06/10/2022 Chronic Left Hip Pain:  Continue to Monitor. 06/10/2022 Sleep Disturbance: Continue Elavil at HS. Continue to Monitor. 06/10/2022  F/U with [name]

## 2022-06-13 ENCOUNTER — Encounter: Payer: Self-pay | Admitting: Physical Medicine & Rehabilitation

## 2022-06-13 ENCOUNTER — Encounter: Payer: BC Managed Care – PPO | Admitting: Physical Medicine & Rehabilitation

## 2022-06-13 VITALS — BP 133/84 | HR 71 | Ht 72.0 in | Wt 219.0 lb

## 2022-06-13 DIAGNOSIS — M545 Low back pain, unspecified: Secondary | ICD-10-CM | POA: Diagnosis not present

## 2022-06-13 DIAGNOSIS — M961 Postlaminectomy syndrome, not elsewhere classified: Secondary | ICD-10-CM | POA: Diagnosis not present

## 2022-06-13 DIAGNOSIS — G8929 Other chronic pain: Secondary | ICD-10-CM | POA: Diagnosis not present

## 2022-06-13 DIAGNOSIS — G479 Sleep disorder, unspecified: Secondary | ICD-10-CM | POA: Diagnosis not present

## 2022-06-13 DIAGNOSIS — M47816 Spondylosis without myelopathy or radiculopathy, lumbar region: Secondary | ICD-10-CM | POA: Diagnosis not present

## 2022-06-13 DIAGNOSIS — M5416 Radiculopathy, lumbar region: Secondary | ICD-10-CM

## 2022-06-13 DIAGNOSIS — R269 Unspecified abnormalities of gait and mobility: Secondary | ICD-10-CM | POA: Diagnosis not present

## 2022-06-13 MED ORDER — PREGABALIN 75 MG PO CAPS: 75.0000 mg | ORAL_CAPSULE | Freq: Three times a day (TID) | ORAL | 1 refills | Status: DC

## 2022-06-13 NOTE — Progress Notes (Signed)
Subjective:    Patient ID: Daniel Marsh, male    DOB: 18-Jun-1981, 41 y.o.   MRN: [id number]  HPI  CC:  Increasing sciatic pain  42yrhx of low back and sciatic pain , increased down left leg, occ RIght glut pain.  The patient denies falls or trauma. Left Lateral foot has most pain some numbness in the left little toe Repeat lumbar spine x-ray dated 06/10/2022 demonstrates L4-5 fusion with good placement of hardware.  No other significant abnormalities mild adjacent level degeneration at L3-4. We also discussed the patient has not driven for 2 years.  He was told by one of his doctors not to drive because of numbness on the left side.  He has no issues on the right side.  He has automatic transmission Pain Inventory Average Pain 7 Pain Right Now 10 My pain is intermittent, constant, sharp, burning, dull, stabbing, tingling, and aching  In the last 24 hours, has pain interfered with the following? General activity 7 Relation with others 7 Enjoyment of life 10 What TIME of day is your pain at its worst? morning , daytime, evening, night, and varies Sleep (in general) Poor  Pain is worse with: walking, bending, sitting, standing, and some activites Pain improves with:  nothing Relief from Meds: tylenol taken with a level 2 relief      Family History  Problem Relation Age of Onset   Osteoarthritis Mother    Depression Mother    Heart attack Mother 32  Osteoarthritis Father    Depression Father    Bladder Cancer Father    Prostate cancer Father    Depression Brother    Depression Brother    Diabetes Maternal Grandmother    Hyperlipidemia Maternal Grandmother    Hypertension Maternal Grandmother    Heart attack Maternal Grandmother    Osteoarthritis Maternal Grandfather    COPD Maternal Grandfather    Kidney disease Maternal Grandfather    Renal cancer Maternal Grandfather    Alcohol abuse Paternal Grandmother    COPD Paternal Grandmother    Diabetes Paternal  Grandmother    Hypertension Paternal Grandmother    Lung cancer Paternal Grandmother    Osteoarthritis Paternal Grandfather    Heart attack Paternal Grandfather    Heart failure Paternal Grandfather    Pancreatic cancer Maternal Aunt    Colon cancer Maternal Uncle    Lung cancer Paternal Aunt    Social History   Socioeconomic History   Marital status: Married    Spouse name: Not on file   Number of children: 3   Years of education: Not on file   Highest education level: Not on file  Occupational History   Not on file  Tobacco Use   Smoking status: Every Day    Packs/day: 0.25    Years: 15.00    Total pack years: 3.75    Types: Cigarettes, Cigars   Smokeless tobacco: Current    Types: Chew   Tobacco comments:    I am usually a social smoker  Vaping Use   Vaping Use: Never used  Substance and Sexual Activity   Alcohol use: Yes   Drug use: Yes    Types: Marijuana    Comment: Was using to self medicate for depression and anxiety   Sexual activity: Yes    Birth control/protection: I.U.D.    Comment: My wife [facility] has [facility]  Other Topics Concern   Not on file  Social History Narrative   **  Merged History Encounter **       Was in the [employer] for 7 years, required lumbar surgery while in the [employer] and had poor outcome Divorced and re-married 61 yo son    Social Determinants of Radio broadcast assistant Strain: Not on file  Food Insecurity: Not on file  Transportation Needs: Not on file  Physical Activity: Not on file  Stress: Not on file  Social Connections: Not on file   Past Surgical History:  Procedure Laterality Date   ANKLE SURGERY Right 2011   KNEE SURGERY Left 2020   [place]  2013   Lumbar fusion L4-S1   WISDOM TOOTH EXTRACTION     Past Medical History:  Diagnosis Date   Anemia    Anxiety    Arthritis    Depression    Hypertension    Liver disease    Myocardial infarction ([place])    PTSD (post-traumatic stress disorder)     Ulcerative colitis ([place]) 2019   Ht 6' (1.829 m)   Wt 219 lb (99.3 kg)   BMI 29.70 kg/m   Opioid Risk Score:   Fall Risk Score:  `1  Depression screen [facility] 2/9     06/10/2022    3:03 PM 06/09/2022    2:17 PM 02/11/2022    3:30 PM 12/30/2021    4:13 AM 08/13/2021    3:41 PM 05/17/2021    9:13 AM 03/19/2021   10:18 AM  Depression screen PHQ 2/9  Decreased Interest 3 2 3  1 1 1   Down, Depressed, Hopeless 3 3 3  1 1 1   PHQ - 2 Score 6 5 6  2 2 2   Altered sleeping  3     1  Tired, decreased energy  3       Change in appetite  3       Feeling bad or failure about yourself   3       Trouble concentrating  2       Moving slowly or fidgety/restless  2       Suicidal thoughts  1       PHQ-9 Score  22     3  Difficult doing work/chores  Very difficult          Information is confidential and restricted. Go to Review Flowsheets to unlock data.    Review of Systems  Musculoskeletal:  Positive for back pain and gait problem.       Pain in both knees, pain in left leg & foot  All other systems reviewed and are negative.     Objective:   Physical Exam Vitals and nursing note reviewed.  Constitutional:      Appearance: He is normal weight.  HENT:     Head: Normocephalic and atraumatic.  Eyes:     Extraocular Movements: Extraocular movements intact.     Conjunctiva/sclera: Conjunctivae normal.     Pupils: Pupils are equal, round, and reactive to light.  Musculoskeletal:     Right lower leg: No edema.     Left lower leg: No edema.     Comments: Pain with lumbar flexion greater than with extension Positive straight leg raise test left lower extremity Pinprick sensation is reduced left S1 and left L4 distribution not in the left L5  Skin:    General: Skin is warm and dry.  Neurological:     Mental Status: He is alert and oriented to person, place, and time.  Psychiatric:  Mood and Affect: Mood normal.        Behavior: Behavior normal.   Ambulates with a cane no  evidence of toe drag or knee instability        Assessment & Plan:  1.  Lumbar post laminectomy syndrome.  Functionally doing well but increasing pain left lower extremity mainly sciatic.  We discussed treatment options he is on gabapentin 1200 mg 3 times daily will reduce to 600 3 times daily and add Lyrica 75 3 times daily.  Overall plan is to wean down on gabapentin and increase Lyrica as needed. We will also schedule for left S1 transforaminal lumbar epidural steroid injection under fluoroscopic guidance.

## 2022-06-14 ENCOUNTER — Other Ambulatory Visit: Payer: Self-pay

## 2022-06-14 ENCOUNTER — Emergency Department (HOSPITAL_BASED_OUTPATIENT_CLINIC_OR_DEPARTMENT_OTHER)
Admission: EM | Admit: 2022-06-14 | Discharge: 2022-06-15 | Disposition: A | Discharge status: Home / Self Care | Payer: BC Managed Care – PPO | Attending: Emergency Medicine | Admitting: Emergency Medicine

## 2022-06-14 ENCOUNTER — Encounter (HOSPITAL_BASED_OUTPATIENT_CLINIC_OR_DEPARTMENT_OTHER): Payer: Self-pay | Admitting: Emergency Medicine

## 2022-06-14 DIAGNOSIS — X58XXXA Exposure to other specified factors, initial encounter: Secondary | ICD-10-CM | POA: Insufficient documentation

## 2022-06-14 DIAGNOSIS — S6991XA Unspecified injury of right wrist, hand and finger(s), initial encounter: Secondary | ICD-10-CM | POA: Diagnosis not present

## 2022-06-14 DIAGNOSIS — S61421A Laceration with foreign body of right hand, initial encounter: Secondary | ICD-10-CM

## 2022-06-14 NOTE — ED Triage Notes (Signed)
  Patient comes in with laceration to R thumb that occur about an hour ago.  Patient states he put his hand back to sit down in his recliner and something stabbed his finger.  Patient has about 1 in laceration at base of R thumb.  Bleeding is controlled at this time.  Pain 7/10, throbbing.

## 2022-06-15 MED ORDER — LIDOCAINE-EPINEPHRINE (PF) 2 %-1:200000 IJ SOLN
10.0000 mL | Freq: Once | INTRAMUSCULAR | Status: AC
  Administered 2022-06-15: 10 mL
  Filled 2022-06-15: qty 20

## 2022-06-15 NOTE — ED Provider Notes (Signed)
[facility] EMERGENCY DEPT  Provider Note  CSN: [id number] Arrival date & time: 06/14/22 2227  History Chief Complaint  Patient presents with   Laceration    Daniel Marsh is a 41 y.o. male reports he sustained a laceration to his R thenar eminence while getting up out of a recliner, unsure what he may have cut himself on. Bleeding controlled. TDAP UTD   Home Medications Prior to Admission medications   Medication Sig Start Date End Date Taking? Authorizing Provider  acetaminophen (TYLENOL) 500 MG tablet Take 1,500-2,000 mg by mouth every 6 (six) hours as needed (For foot pain).    [provider]  amitriptyline (ELAVIL) 75 MG tablet Take 1 tablet (75 mg total) by mouth at bedtime. 04/09/22   Arfeen, Arlyce Harman, MD  atorvastatin (LIPITOR) 20 MG tablet Take 1 tablet (20 mg total) by mouth daily. 02/28/22   Inda Coke, PA  diazepam (VALIUM) 5 MG tablet Take 1 tablet (5 mg total) by mouth at bedtime as needed for anxiety. 06/09/22   Inda Coke, PA  dicyclomine (BENTYL) 10 MG capsule Take 1 capsule (10 mg total) by mouth 3 (three) times daily as needed (abdominal pain/ cramping). 10/17/21   Esterwood, Amy S, PA-C  gabapentin (NEURONTIN) 600 MG tablet Take 2 tablets (1,200 mg total) by mouth 3 (three) times daily. 02/11/22   Bayard Hugger, NP  mesalamine (APRISO) 0.375 g 24 hr capsule Take 1 capsule (0.375 g total) by mouth in the morning, at noon, in the evening, and at bedtime. 04/09/22   Esterwood, Amy S, PA-C  omeprazole (PRILOSEC) 40 MG capsule Take 1 capsule (40 mg total) by mouth daily. 07/09/20   Esterwood, Amy S, PA-C  ondansetron (ZOFRAN ODT) 4 MG disintegrating tablet Take 1 tablet (4 mg total) by mouth every 8 (eight) hours as needed for nausea or vomiting. 09/25/21   Godfrey Pick, MD  prazosin (MINIPRESS) 2 MG capsule Take 1 capsule (2 mg total) by mouth at bedtime. To be taken with the 1 mg capsule for a total of 3 mg. 02/28/22   Inda Coke,  PA  pregabalin (LYRICA) 75 MG capsule Take 1 capsule (75 mg total) by mouth 3 (three) times daily. 06/13/22   Kirsteins, Luanna Salk, MD  vortioxetine HBr (TRINTELLIX) 20 MG TABS tablet Take 1 tablet (20 mg total) by mouth daily. 02/28/22   Inda Coke, PA     Allergies    Patient has no known allergies.   Review of Systems   Review of Systems Please see HPI for pertinent positives and negatives  Physical Exam BP (!) 158/94 (BP Location: Left Arm)   Pulse 88   Temp 98.5 F (36.9 C)   Resp 16   Ht 6' (1.829 m)   Wt 99.3 kg   SpO2 100%   BMI 29.70 kg/m   Physical Exam Vitals and nursing note reviewed.  HENT:     Head: Normocephalic.     Nose: Nose normal.  Eyes:     Extraocular Movements: Extraocular movements intact.  Pulmonary:     Effort: Pulmonary effort is normal.  Musculoskeletal:        General: Normal range of motion.     Cervical back: Neck supple.  Skin:    Findings: No rash (on exposed skin).     Comments: 3cm V shaped laceration over the R thenar eminence, normal tendon function, NVI  Neurological:     Mental Status: He is alert and oriented to person, place, and  time.  Psychiatric:        Mood and Affect: Mood normal.     ED Results / Procedures / Treatments   EKG None  Procedures .Marland KitchenLaceration Repair  Date/Time: 06/15/2022 12:59 AM  Performed by: Truddie Hidden, MD Authorized by: Truddie Hidden, MD   Consent:    Consent obtained:  Verbal Anesthesia:    Anesthesia method:  Local infiltration   Local anesthetic:  Lidocaine 2% WITH epi Laceration details:    Location:  Hand   Hand location:  R palm   Length (cm):  3 Pre-procedure details:    Preparation:  Patient was prepped and draped in usual sterile fashion Exploration:    Imaging outcome: foreign body not noted   Treatment:    Area cleansed with:  Povidone-iodine   Amount of cleaning:  Standard   Irrigation solution:  Sterile saline and tap water Skin repair:    Repair  method:  Sutures   Suture size:  5-0   Suture material:  Nylon   Suture technique:  Simple interrupted   Number of sutures:  4 Approximation:    Approximation:  Close Repair type:    Repair type:  Simple Post-procedure details:    Dressing:  Non-adherent dressing   Procedure completion:  Tolerated well, no immediate complications   Medications Ordered in the ED Medications  lidocaine-EPINEPHrine (XYLOCAINE W/EPI) 2 %-1:200000 (PF) injection 10 mL (has no administration in time range)    Initial Impression and Plan  Plan cleaning and repair of wound. Standard wound care instructions given. PCP or UC follow up for suture removal.   ED Course       MDM Rules/Calculators/A&P Medical Decision Making Problems Addressed: Laceration of right hand with foreign body, initial encounter: acute illness or injury  Risk Prescription drug management.    Final Clinical Impression(s) / ED Diagnoses Final diagnoses:  Laceration of right hand with foreign body, initial encounter    Rx / DC Orders ED Discharge Orders     None        Truddie Hidden, MD 06/15/22 [phone]

## 2022-06-15 NOTE — ED Notes (Signed)
Reviewed AVS/discharge instruction with patient. Time allotted for and all questions answered. Patient is agreeable for d/c and escorted to ed exit by staff.  

## 2022-06-24 ENCOUNTER — Encounter: Payer: Self-pay | Admitting: Physician Assistant

## 2022-06-24 ENCOUNTER — Ambulatory Visit: Payer: BC Managed Care – PPO | Admitting: Physician Assistant

## 2022-06-24 VITALS — BP 126/82 | HR 86 | Temp 98.7°F | Ht 72.0 in | Wt 218.0 lb

## 2022-06-24 DIAGNOSIS — F331 Major depressive disorder, recurrent, moderate: Secondary | ICD-10-CM | POA: Diagnosis not present

## 2022-06-24 DIAGNOSIS — F4310 Post-traumatic stress disorder, unspecified: Secondary | ICD-10-CM | POA: Diagnosis not present

## 2022-06-24 DIAGNOSIS — S61411D Laceration without foreign body of right hand, subsequent encounter: Secondary | ICD-10-CM

## 2022-06-24 DIAGNOSIS — M961 Postlaminectomy syndrome, not elsewhere classified: Secondary | ICD-10-CM | POA: Diagnosis not present

## 2022-06-24 DIAGNOSIS — E538 Deficiency of other specified B group vitamins: Secondary | ICD-10-CM

## 2022-06-24 MED ORDER — CYANOCOBALAMIN 1000 MCG/ML IJ SOLN: 1000.0000 ug | Freq: Once | INTRAMUSCULAR | 1 refills | Status: AC

## 2022-06-24 MED ORDER — CYANOCOBALAMIN 1000 MCG/ML IJ SOLN
1000.0000 ug | Freq: Once | INTRAMUSCULAR | Status: AC
  Administered 2022-06-24: 1000 ug via INTRAMUSCULAR

## 2022-06-24 NOTE — Patient Instructions (Signed)
It was great to see you!  Keep an eye on your wound and keep area clean and closed.  Perform weekly injections x 3 more weeks and then start monthly B12 injections  I'll reach out to [place] regarding your disability paperwork.  Take care,  Inda Coke PA-C

## 2022-06-24 NOTE — Progress Notes (Signed)
Daniel Marsh is a 41 y.o. male here for a follow up of laceration.   History of Present Illness:   Chief Complaint  Patient presents with   Suture / Staple Removal    Pt has some stiches in his Rt hands that need to be removed. He state that 2 of the stiches came loose.    HPI  ED follow-up Patient here for suture removal. He went to ED on 06/14/2022 with laceration to his right hand. Located on palm of his hand. Has had lots of bleeding at that time. States he was trying to get out of recliner. He would like his stiches to be removed today. Symptoms seems to be healing well. Denies any other concerns.  Has 4 sutures that need to be removed. Has slight numbness to outer thumb.   Vitamin B12  Deficiency  Patient does have a hx of this. He would like to receive his B12 injections today. Managing this well. Denies any other concerning sx. His wife will manage this for home.   Depression He saw psychiatry at [facility] since last seeing [place] and states that he is doing well. He is hoping to see a new talk therapist. He is currently taking trintellix 20 mg daily. Denies SI/HI.   Failed back syndrome He is continuing to see [name]. He was recently switched from gabapentin to lyrica. He has not noticed significant improvement yet. Will be going for epidural soon.    Past Medical History:  Diagnosis Date   Anemia    Anxiety    Arthritis    Depression    Hypertension    Liver disease    Myocardial infarction ([place])    PTSD (post-traumatic stress disorder)    Ulcerative colitis ([place]) 2019     Social History   Tobacco Use   Smoking status: Every Day    Packs/day: 0.25    Years: 15.00    Total pack years: 3.75    Types: Cigarettes, Cigars   Smokeless tobacco: Current    Types: Chew   Tobacco comments:    I am usually a social smoker  Vaping Use   Vaping Use: Never used  Substance Use Topics   Alcohol use: Yes   Drug use: Yes    Types: Marijuana    Comment: Was  using to self medicate for depression and anxiety    Past Surgical History:  Procedure Laterality Date   ANKLE SURGERY Right 2011   KNEE SURGERY Left 2020   SPINE SURGERY  2013   Lumbar fusion L4-S1   WISDOM TOOTH EXTRACTION      Family History  Problem Relation Age of Onset   Osteoarthritis Mother    Depression Mother    Heart attack Mother 55   Osteoarthritis Father    Depression Father    Bladder Cancer Father    Prostate cancer Father    Depression Brother    Depression Brother    Diabetes Maternal Grandmother    Hyperlipidemia Maternal Grandmother    Hypertension Maternal Grandmother    Heart attack Maternal Grandmother    Osteoarthritis Maternal Grandfather    COPD Maternal Grandfather    Kidney disease Maternal Grandfather    Renal cancer Maternal Grandfather    Alcohol abuse Paternal Grandmother    COPD Paternal Grandmother    Diabetes Paternal Grandmother    Hypertension Paternal Grandmother    Lung cancer Paternal Grandmother    Osteoarthritis Paternal Grandfather    Heart attack Paternal Grandfather  Heart failure Paternal Grandfather    Pancreatic cancer Maternal Aunt    Colon cancer Maternal Uncle    Lung cancer Paternal Aunt     No Known Allergies  Current Medications:   Current Outpatient Medications:    acetaminophen (TYLENOL) 500 MG tablet, Take 1,500-2,000 mg by mouth every 6 (six) hours as needed (For foot pain)., Disp: , Rfl:    amitriptyline (ELAVIL) 75 MG tablet, Take 1 tablet (75 mg total) by mouth at bedtime., Disp: 90 tablet, Rfl: 0   atorvastatin (LIPITOR) 20 MG tablet, Take 1 tablet (20 mg total) by mouth daily., Disp: 90 tablet, Rfl: 0   diazepam (VALIUM) 5 MG tablet, Take 1 tablet (5 mg total) by mouth at bedtime as needed for anxiety., Disp: 30 tablet, Rfl: 1   dicyclomine (BENTYL) 10 MG capsule, Take 1 capsule (10 mg total) by mouth 3 (three) times daily as needed (abdominal pain/ cramping)., Disp: 90 capsule, Rfl: 3   gabapentin  (NEURONTIN) 600 MG tablet, Take 2 tablets (1,200 mg total) by mouth 3 (three) times daily., Disp: 180 tablet, Rfl: 5   mesalamine (APRISO) 0.375 g 24 hr capsule, Take 1 capsule (0.375 g total) by mouth in the morning, at noon, in the evening, and at bedtime., Disp: 120 capsule, Rfl: 6   omeprazole (PRILOSEC) 40 MG capsule, Take 1 capsule (40 mg total) by mouth daily., Disp: 90 capsule, Rfl: 3   ondansetron (ZOFRAN ODT) 4 MG disintegrating tablet, Take 1 tablet (4 mg total) by mouth every 8 (eight) hours as needed for nausea or vomiting., Disp: 20 tablet, Rfl: 0   prazosin (MINIPRESS) 2 MG capsule, Take 1 capsule (2 mg total) by mouth at bedtime. To be taken with the 1 mg capsule for a total of 3 mg., Disp: 90 capsule, Rfl: 1   pregabalin (LYRICA) 75 MG capsule, Take 1 capsule (75 mg total) by mouth 3 (three) times daily., Disp: 90 capsule, Rfl: 1   vortioxetine HBr (TRINTELLIX) 20 MG TABS tablet, Take 1 tablet (20 mg total) by mouth daily., Disp: 90 tablet, Rfl: 1   Review of Systems:   ROS Negative unless otherwise specified per HPI.   Vitals:   Vitals:   06/24/22 1452  BP: 126/82  Pulse: 86  Temp: 98.7 F (37.1 C)  SpO2: 99%  Weight: 218 lb (98.9 kg)  Height: 6' (1.829 m)     Body mass index is 29.57 kg/m.  Physical Exam:   Physical Exam Vitals and nursing note reviewed.  Constitutional:      General: He is not in acute distress.    Appearance: He is well-developed. He is not ill-appearing or toxic-appearing.  Cardiovascular:     Rate and Rhythm: Normal rate and regular rhythm.     Pulses: Normal pulses.     Heart sounds: Normal heart sounds, S1 normal and S2 normal.  Pulmonary:     Effort: Pulmonary effort is normal.     Breath sounds: Normal breath sounds.  Skin:    General: Skin is warm and dry.     Comments: Overall well healing laceration to base of right thumb  Neurological:     Mental Status: He is alert.     GCS: GCS eye subscore is 4. GCS verbal subscore is  5. GCS motor subscore is 6.  Psychiatric:        Speech: Speech normal.        Behavior: Behavior normal. Behavior is cooperative.     Assessment and  Plan:   Laceration of right hand without foreign body, subsequent encounter 4 sutures removed without issue Area cleaned, bandaged Wound care discussed Declines referral to hand specialist for numbness -- he will let us know if he changes his mind  Vitamin B12 deficiency Start injections  Moderate episode of recurrent major depressive disorder ([place]) Stable per patient Continue trintellix 20 mg daily Follow-up in 3-6 months Denies SI/HI  Failed back syndrome He is asking for disability forms to be completed I'm going to reach out to Pain Management as I typically do not complete these   I,Savera Zaman,acting as a scribe for [employer], PA.,have documented all relevant documentation on the behalf of Daniel Coke, PA,as directed by  Daniel Coke, PA while in the presence of Daniel Marsh, [place].   I, Daniel Marsh, [place], have reviewed all documentation for this visit. The documentation on 06/24/22 for the exam, diagnosis, procedures, and orders are all accurate and complete.   Daniel Coke, PA-C

## 2022-06-25 ENCOUNTER — Encounter: Payer: Self-pay | Admitting: Physician Assistant

## 2022-07-01 ENCOUNTER — Telehealth: Payer: Self-pay | Admitting: Physician Assistant

## 2022-07-01 NOTE — Telephone Encounter (Signed)
..  Type of form received:disability   Additional comments:   Received by: fax from [place]   Form should be Faxed UZ:[phone]  Form should be mailed to:    Is patient requesting call for pickup: Unknown   Form placed:   sams folder   Attach charge sheet.  Yes   Individual made aware of 3-5 business day turn around (Y/N)?

## 2022-07-02 NOTE — Telephone Encounter (Signed)
Received disability forms, put in Daniel Marsh's folder to fill out.

## 2022-07-07 ENCOUNTER — Telehealth: Payer: Self-pay | Admitting: Physician Assistant

## 2022-07-07 NOTE — Telephone Encounter (Signed)
Please call patient.  I have reviewed his disability paperwork with my supervising physician.  I personally do not feel qualified to complete this paperwork. I did reach out to [name] and her staff states that she will not fill out disability paperwork.  I recommend that he inform his attorney of this to help best figure out next steps.  I do feel as though he would have better results with a specialist that can provide medical expertise on these issues. I'm happy to refer him to another provider should he feel that is needed.  Aldona Bar

## 2022-07-08 NOTE — Telephone Encounter (Signed)
Left message on voicemail to call office.  

## 2022-07-09 ENCOUNTER — Telehealth (HOSPITAL_BASED_OUTPATIENT_CLINIC_OR_DEPARTMENT_OTHER): Payer: BC Managed Care – PPO | Admitting: Psychiatry

## 2022-07-09 ENCOUNTER — Encounter (HOSPITAL_COMMUNITY): Payer: Self-pay | Admitting: Psychiatry

## 2022-07-09 VITALS — Wt 218.0 lb

## 2022-07-09 DIAGNOSIS — F4310 Post-traumatic stress disorder, unspecified: Secondary | ICD-10-CM

## 2022-07-09 DIAGNOSIS — F1210 Cannabis abuse, uncomplicated: Secondary | ICD-10-CM | POA: Diagnosis not present

## 2022-07-09 DIAGNOSIS — F331 Major depressive disorder, recurrent, moderate: Secondary | ICD-10-CM

## 2022-07-09 MED ORDER — AMITRIPTYLINE HCL 100 MG PO TABS: 100.0000 mg | ORAL_TABLET | Freq: Every day | ORAL | 0 refills | Status: DC

## 2022-07-09 NOTE — Progress Notes (Signed)
Virtual Visit via Video Note  I connected with Daniel Marsh on 07/09/22 at 10:40 AM EDT by a video enabled telemedicine application and verified that I am speaking with the correct person using two identifiers.  Location: Patient: Home Provider: Home Office   I discussed the limitations of evaluation and management by telemedicine and the availability of in person appointments. The patient expressed understanding and agreed to proceed.  History of Present Illness: Patient is evaluated by video session.  He endorsed things are mostly manageable but there are times when he struggle with insomnia, irritability and episodic mood swings.  He noticed get more anxious and irritable when he is around people and public places.  He has occasionally nightmares.  Patient was seen in the emergency room for laceration 3 weeks ago.  He required 4 stitches when accidentally his thumb stuck in the recliner.  He is doing better.  Recently had a visit with his PCP and found to have low B12.  His cholesterol is normal.  He is getting B12 injection.  He reported his mood is somewhat better and he is getting along better with his wife and his son.  They went to [place] for a family reunion and recently had a day trip to [place].  He feels the medicine working.  He is no longer seeing Joan Flores who terminated him but did not provide reason.  Patient is trying to set up an appointment with a new therapist at [place].  He continues to smoke marijuana about had cut down from the past.  He is taking Valium, Trintellix from his PCP.  His appetite is okay.  His weight is stable.  He is taking amitriptyline from our office.   Past Psychiatric History: H/O depression, PTSD and anxiety. Had treatment in [place] from 2011-2006. Tried Prozac and Wellbutrin but did not like, Zoloft caused sexual side effects and Cymbalta worked for a while. H/O group therapy.  No history of suicidal attempt or inpatient treatment. H/O  DUI and incarceration for 9 months in [place] for the charges of sexual assault and indecent exposure.  Recent Results (from the past 2160 hour(s))  Vitamin B12     Status: Abnormal   Collection Time: 06/09/22  2:08 PM  Result Value Ref Range   Vitamin B-12 170 (L) 211 - 911 pg/mL  TSH     Status: None   Collection Time: 06/09/22  2:08 PM  Result Value Ref Range   TSH 0.60 0.35 - 5.50 uIU/mL  Lipid Profile     Status: None   Collection Time: 06/09/22  2:08 PM  Result Value Ref Range   Cholesterol 198 0 - 200 mg/dL    Comment: ATP III Classification       Desirable:  < 200 mg/dL               Borderline High:  200 - 239 mg/dL          High:  > = 240 mg/dL   Triglycerides 111.0 0.0 - 149.0 mg/dL    Comment: Normal:  <150 mg/dLBorderline High:  150 - 199 mg/dL   HDL 76.80 >39.00 mg/dL   VLDL 22.2 0.0 - 40.0 mg/dL   LDL Cholesterol 99 0 - 99 mg/dL   Total CHOL/HDL Ratio 3     Comment:                Men          Women1/2 Average Risk  3.4          3.3Average Risk          5.0          4.42X Average Risk          9.6          7.13X Average Risk          15.0          11.0                       NonHDL 121.63     Comment: NOTE:  Non-HDL goal should be 30 mg/dL higher than patient's LDL goal (i.e. LDL goal of < 70 mg/dL, would have non-HDL goal of < 100 mg/dL)     Psychiatric Specialty Exam: Physical Exam  Review of Systems  Weight 218 lb (98.9 kg).There is no height or weight on file to calculate BMI.  General Appearance: Fairly Groomed  Eye Contact:  Fair  Speech:  Slow  Volume:  Decreased  Mood:  Dysphoric  Affect:  Constricted  Thought Process:  Goal Directed  Orientation:  Full (Time, Place, and Person)  Thought Content:  Rumination  Suicidal Thoughts:  No  Homicidal Thoughts:  No  Memory:  Immediate;   Good Recent;   Fair Remote;   Good  Judgement:  Intact  Insight:  Present  Psychomotor Activity:  NA  Concentration:  Concentration: Fair and Attention Span: Fair   Recall:  Good  Fund of Knowledge:  Good  Language:  Good  Akathisia:  No  Handed:  Right  AIMS (if indicated):     Assets:  Communication Skills Desire for Improvement Housing Transportation  ADL's:  Intact  Cognition:  WNL  Sleep:   5-6 hrs      Assessment and Plan: Major depressive disorder, recurrent.  PTSD.  Mild cannabis use.  I review his current medication, records from recent emergency room visit.  He is taking Minipress 2 mg, Valium 5 mg and Trintellix 20 mg from his PCP.  He still smoke marijuana but cut down from the past.  We talk about stopping the marijuana in the past and again and he promised to work on it.  I recommend trying amitriptyline 100 mg to help his residual insomnia and depressive symptoms.  He also struggled with anxiety when he go outside and in the public.  He agreed to give a try.  He will start a new therapist since his previous therapist Joan Flores terminated him and he do not know the reason.  Now he is getting B12 injection and is hoping that helps his tremors and energy.  He does go outside with the dog and I encourage to continue that..  Follow Up Instructions:    I discussed the assessment and treatment plan with the patient. The patient was provided an opportunity to ask questions and all were answered. The patient agreed with the plan and demonstrated an understanding of the instructions.   The patient was advised to call back or seek an in-person evaluation if the symptoms worsen or if the condition fails to improve as anticipated.  Collaboration of Care: Other provider involved in patient's care AEB notes are available in epic to review.  Patient/Guardian was advised Release of Information must be obtained prior to any record release in order to collaborate their care with an outside provider. Patient/Guardian was advised if they have not already done so to contact the registration  department to sign all necessary forms in order for [place]  to release information regarding their care.   Consent: Patient/Guardian gives verbal consent for treatment and assignment of benefits for services provided during this visit. Patient/Guardian expressed understanding and agreed to proceed.    I provided 29 minutes of non-face-to-face time during this encounter.   Kathlee Nations, MD

## 2022-07-10 NOTE — Telephone Encounter (Signed)
Left message on voicemail to call office.  

## 2022-07-15 NOTE — Telephone Encounter (Signed)
Tried to contact pt unable to leave message voicemail box is full. Left detailed message on pt's wife Estill Bamberg voicemail to call office about pt's disability papers.

## 2022-07-17 ENCOUNTER — Telehealth: Payer: Self-pay | Admitting: Physician Assistant

## 2022-07-17 NOTE — Telephone Encounter (Signed)
Patient states: - He was returning Donna's call   Patient requests: -A callback from Butch Penny when able at [phone].

## 2022-07-17 NOTE — Telephone Encounter (Signed)
Tried to call pt back voicemail box is full unable to leave a message.

## 2022-07-18 NOTE — Telephone Encounter (Signed)
Tried to contact pt again voicemail box is full unable to leave message.

## 2022-07-18 NOTE — Telephone Encounter (Signed)
See other message

## 2022-07-18 NOTE — Telephone Encounter (Signed)
Spoke to pt's wife Estill Bamberg, told her I have been trying to get in touch with your husband, but his voicemail is full. I wanted to let him know about his disability paperwork he gave Aldona Bar to complete.  Aldona Bar said, I have reviewed his disability paperwork with my supervising physician.   I personally do not feel qualified to complete this paperwork. I did reach out to [name] and her staff states that she will not fill out disability paperwork.   I recommend that he inform his attorney of this to help best figure out next steps.  I do feel as though he would have better results with a specialist that can provide medical expertise on these issues. I'm happy to refer him to another provider should he feel that is needed. Estill Bamberg verbalized understanding and said she will let him know.  Asked her if they needed to pick paperwork up? Estill Bamberg said yes they will. Told her I will put it at the front desk for you to pick up. Estill Bamberg verbalized understanding.

## 2022-07-24 ENCOUNTER — Encounter: Payer: BC Managed Care – PPO | Attending: Registered Nurse | Admitting: Physical Medicine & Rehabilitation

## 2022-07-24 ENCOUNTER — Encounter: Payer: Self-pay | Admitting: Physical Medicine & Rehabilitation

## 2022-07-24 ENCOUNTER — Encounter: Payer: BC Managed Care – PPO | Admitting: Physical Medicine & Rehabilitation

## 2022-07-24 VITALS — BP 139/91 | HR 80 | Ht 72.0 in | Wt 220.0 lb

## 2022-07-24 DIAGNOSIS — G8929 Other chronic pain: Secondary | ICD-10-CM | POA: Diagnosis not present

## 2022-07-24 DIAGNOSIS — M545 Low back pain, unspecified: Secondary | ICD-10-CM | POA: Diagnosis not present

## 2022-07-24 MED ORDER — LIDOCAINE HCL 1 % IJ SOLN
5.0000 mL | Freq: Once | INTRAMUSCULAR | Status: AC
  Administered 2022-07-24: 5 mL

## 2022-07-24 MED ORDER — LIDOCAINE HCL (PF) 1 % IJ SOLN
2.0000 mL | Freq: Once | INTRAMUSCULAR | Status: AC
  Administered 2022-07-24: 2 mL

## 2022-07-24 MED ORDER — DEXAMETHASONE SODIUM PHOSPHATE 10 MG/ML IJ SOLN
10.0000 mg | Freq: Once | INTRAMUSCULAR | Status: AC
  Administered 2022-07-24: 10 mg

## 2022-07-24 NOTE — Patient Instructions (Signed)

## 2022-07-24 NOTE — Progress Notes (Signed)
  PROCEDURE RECORD [facility]   Name: Uchenna Rappaport DOB:February 28, 1981 MRN: [id number]  Date:07/24/2022  Physician: Alysia Penna, MD    Nurse/CMA: Jorja Loa MA  Allergies: No Known Allergies  Consent Signed: Yes.    Is patient diabetic? No.  CBG today? .  Pregnant: No. LMP: No LMP for male patient. (age 39-55)  Anticoagulants: no Anti-inflammatory: no Antibiotics: no  Procedure: Left S1 Epidural Steroid Injection  Position: Prone Start Time: 11:38 am  End Time: 11:43 am  Fluoro Time: 27  RN/CMA Jones  MA  Annebelle Bostic MA    Time 11:12 am 11:47 am    BP 139/91 147/89    Pulse 82 74    Respirations 16 16    O2 Sat 99 98    S/S 6 6    Pain Level 8/10 3/10     D/C home with wife, patient A & O X 3, D/C instructions reviewed, and sits independently.

## 2022-07-24 NOTE — Progress Notes (Signed)
Lumbosacral transforaminal epidural steroid injection under fluoroscopic guidance with contrast enhancement  Indication: Lumbosacral radiculitis is not relieved by medication management or other conservative care and interfering with self-care and mobility.   Informed consent was obtained after describing risk and benefits of the procedure with the patient, this includes bleeding, bruising, infection, paralysis and medication side effects.  The patient wishes to proceed and has given written consent.  Patient was placed in prone position.  The lumbar area was marked and prepped with Betadine.  It was entered with a 25-gauge 1-1/2 inch needle and one mL of 1% lidocaine was injected into the skin and subcutaneous tissue.  Then a 22-gauge 3.5in spinal needle was inserted into the Left S1 foramen intervertebral foramen under AP, lateral, and oblique view.  Once needle tip was within the foramen on lateral views an dnor exceeding 6 o clock position on th epedical on AP viewed Isovue 200 was inected x 724m Then a solution containing one mL of 10 mg per mL dexamethasone and 2 mL of 1% lidocaine was injected.  The patient tolerated procedure well.  Post procedure instructions were given.  Please see post procedure form.   Dexamethasone 1224mLidocaine PF 24m824midocaine 1% 5ml39m

## 2022-08-06 ENCOUNTER — Encounter: Payer: Self-pay | Admitting: Registered Nurse

## 2022-08-06 ENCOUNTER — Encounter: Payer: BC Managed Care – PPO | Attending: Registered Nurse | Admitting: Registered Nurse

## 2022-08-06 VITALS — BP 124/78 | HR 72 | Ht 72.0 in | Wt 221.4 lb

## 2022-08-06 DIAGNOSIS — R269 Unspecified abnormalities of gait and mobility: Secondary | ICD-10-CM | POA: Diagnosis not present

## 2022-08-06 DIAGNOSIS — G8929 Other chronic pain: Secondary | ICD-10-CM | POA: Diagnosis present

## 2022-08-06 DIAGNOSIS — M25561 Pain in right knee: Secondary | ICD-10-CM | POA: Insufficient documentation

## 2022-08-06 DIAGNOSIS — M25562 Pain in left knee: Secondary | ICD-10-CM | POA: Diagnosis present

## 2022-08-06 DIAGNOSIS — M47816 Spondylosis without myelopathy or radiculopathy, lumbar region: Secondary | ICD-10-CM | POA: Insufficient documentation

## 2022-08-06 DIAGNOSIS — M5416 Radiculopathy, lumbar region: Secondary | ICD-10-CM | POA: Insufficient documentation

## 2022-08-06 DIAGNOSIS — M961 Postlaminectomy syndrome, not elsewhere classified: Secondary | ICD-10-CM | POA: Insufficient documentation

## 2022-08-06 MED ORDER — PREGABALIN 75 MG PO CAPS
75.0000 mg | ORAL_CAPSULE | Freq: Three times a day (TID) | ORAL | 0 refills | Status: DC
Start: 2022-08-06 — End: 2023-08-04

## 2022-08-06 NOTE — Progress Notes (Signed)
Subjective:    Patient ID: Daniel Marsh, male    DOB: January 30, 1981, 41 y.o.   MRN: [id number]  HPI: Daniel Marsh is a 41 y.o. male who returns for follow up appointment for chronic pain and medication refill. He states his pain is located in his lower back pain radiating into his left lower extremity and left foot. He also reports lower back pain radiating into his right hip. He rates his pain 8. His current exercise regime is walking short distances with cane.    Pain Inventory Average Pain 7 Pain Right Now 8 My pain is sharp, burning, dull, stabbing, tingling, and aching  In the last 24 hours, has pain interfered with the following? General activity 7 Relation with others 7 Enjoyment of life 7 What TIME of day is your pain at its worst? morning , daytime, evening, and night Sleep (in general) Poor  Pain is worse with: walking, bending, sitting, inactivity, standing, and some activites Pain improves with:  . Relief from Meds: 0  Family History  Problem Relation Age of Onset   Osteoarthritis Mother    Depression Mother    Heart attack Mother 67   Osteoarthritis Father    Depression Father    Bladder Cancer Father    Prostate cancer Father    Depression Brother    Depression Brother    Diabetes Maternal Grandmother    Hyperlipidemia Maternal Grandmother    Hypertension Maternal Grandmother    Heart attack Maternal Grandmother    Osteoarthritis Maternal Grandfather    COPD Maternal Grandfather    Kidney disease Maternal Grandfather    Renal cancer Maternal Grandfather    Alcohol abuse Paternal Grandmother    COPD Paternal Grandmother    Diabetes Paternal Grandmother    Hypertension Paternal Grandmother    Lung cancer Paternal Grandmother    Osteoarthritis Paternal Grandfather    Heart attack Paternal Grandfather    Heart failure Paternal Grandfather    Pancreatic cancer Maternal Aunt    Colon cancer Maternal Uncle    Lung cancer Paternal Aunt     Social History   Socioeconomic History   Marital status: Married    Spouse name: Not on file   Number of children: 3   Years of education: Not on file   Highest education level: Not on file  Occupational History   Not on file  Tobacco Use   Smoking status: Every Day    Packs/day: 0.25    Years: 15.00    Total pack years: 3.75    Types: Cigarettes, Cigars   Smokeless tobacco: Current    Types: Chew   Tobacco comments:    I am usually a social smoker  Vaping Use   Vaping Use: Never used  Substance and Sexual Activity   Alcohol use: Yes   Drug use: Yes    Types: Marijuana    Comment: Was using to self medicate for depression and anxiety   Sexual activity: Yes    Birth control/protection: I.U.D.    Comment: My wife currently has the [facility] IUD  Other Topics Concern   Not on file  Social History Narrative   ** Merged History Encounter **       Was in the [employer] for 7 years, required lumbar surgery while in the [employer] and had poor outcome Divorced and re-married 62 yo son    Social Determinants of Radio broadcast assistant Strain: Not on file  Food Insecurity: Not on Pensions consultant  Needs: Not on file  Physical Activity: Not on file  Stress: Not on file  Social Connections: Not on file   Past Surgical History:  Procedure Laterality Date   ANKLE SURGERY Right 2011   KNEE SURGERY Left 2020   [place]  2013   Lumbar fusion L4-S1   WISDOM TOOTH EXTRACTION     Past Surgical History:  Procedure Laterality Date   ANKLE SURGERY Right 2011   KNEE SURGERY Left 2020   SPINE SURGERY  2013   Lumbar fusion L4-S1   WISDOM TOOTH EXTRACTION     Past Medical History:  Diagnosis Date   Anemia    Anxiety    Arthritis    Depression    Hypertension    Liver disease    Myocardial infarction ([place])    PTSD (post-traumatic stress disorder)    Ulcerative colitis ([place]) 2019   BP 124/78   Pulse 72   Ht 6' (1.829 m)   Wt 221 lb 6.4 oz (100.4 kg)   SpO2  98%   BMI 30.03 kg/m   Opioid Risk Score:   Fall Risk Score:  `1  Depression screen [facility] 2/9     06/24/2022    2:55 PM 06/13/2022    9:57 AM 06/10/2022    3:03 PM 06/09/2022    2:17 PM 02/11/2022    3:30 PM 12/30/2021    4:13 AM 08/13/2021    3:41 PM  Depression screen PHQ 2/9  Decreased Interest 0 1 3 2 3  1   Down, Depressed, Hopeless 0 1 3 3 3  1   PHQ - 2 Score 0 2 6 5 6  2   Altered sleeping 0   3     Tired, decreased energy 0   3     Change in appetite 0   3     Feeling bad or failure about yourself  0   3     Trouble concentrating 0   2     Moving slowly or fidgety/restless 0   2     Suicidal thoughts 0   1     PHQ-9 Score 0   22     Difficult doing work/chores Not difficult at all   Very difficult        Information is confidential and restricted. Go to Review Flowsheets to unlock data.      Review of Systems  Musculoskeletal:  Positive for back pain.  All other systems reviewed and are negative.     Objective:   Physical Exam        Assessment & Plan:  Acute Exacerbation of chronic low back Pain: S/P ESI with [name] on 07/24/2022, with no relief noted. He has a scheduled follow up appointment with [name]. We will continue to monitor. 08/06/2022 Failed Back Syndrome: Continue HEP as Tolerated. Continue current medication regimen. Continue to monitor. 08/06/2022 Facet Arthropathy, Lumbar: Continue HEP as Tolerated. Continue current medication regimen. Continue to monitor. 08/06/2022 Lumbar Radiculitis: Continue  Lyrica. Continue to Monitor. 08/06/2022 Abnormality of Gait: Continue using cane. Continue to monitor. 08/06/2022 Chronic Bilateral Knee pain: L>R: Orthopedist Following  Continue to monitor 08/06/2022 Chronic Left Hip Pain:  Continue to Monitor. 08/06/2022 Sleep Disturbance: Continue Elavil at HS. Continue to Monitor. 08/06/2022   F/U with [name]

## 2022-08-07 ENCOUNTER — Encounter: Payer: Self-pay | Admitting: Registered Nurse

## 2022-08-12 ENCOUNTER — Ambulatory Visit: Payer: BC Managed Care – PPO | Admitting: Registered Nurse

## 2022-08-25 ENCOUNTER — Encounter: Payer: Self-pay | Admitting: *Deleted

## 2022-09-12 ENCOUNTER — Encounter: Payer: BC Managed Care – PPO | Attending: Registered Nurse | Admitting: Physical Medicine & Rehabilitation

## 2022-09-12 DIAGNOSIS — R269 Unspecified abnormalities of gait and mobility: Secondary | ICD-10-CM | POA: Insufficient documentation

## 2022-09-12 DIAGNOSIS — G8929 Other chronic pain: Secondary | ICD-10-CM | POA: Insufficient documentation

## 2022-09-12 DIAGNOSIS — M5416 Radiculopathy, lumbar region: Secondary | ICD-10-CM | POA: Insufficient documentation

## 2022-09-12 DIAGNOSIS — M25561 Pain in right knee: Secondary | ICD-10-CM | POA: Insufficient documentation

## 2022-09-12 DIAGNOSIS — M961 Postlaminectomy syndrome, not elsewhere classified: Secondary | ICD-10-CM | POA: Insufficient documentation

## 2022-09-12 DIAGNOSIS — M25562 Pain in left knee: Secondary | ICD-10-CM | POA: Insufficient documentation

## 2022-09-12 DIAGNOSIS — M47816 Spondylosis without myelopathy or radiculopathy, lumbar region: Secondary | ICD-10-CM | POA: Insufficient documentation

## 2022-10-08 ENCOUNTER — Encounter (HOSPITAL_COMMUNITY): Payer: Self-pay | Admitting: Psychiatry

## 2022-10-08 ENCOUNTER — Telehealth (HOSPITAL_BASED_OUTPATIENT_CLINIC_OR_DEPARTMENT_OTHER): Payer: BC Managed Care – PPO | Admitting: Psychiatry

## 2022-10-08 DIAGNOSIS — F331 Major depressive disorder, recurrent, moderate: Secondary | ICD-10-CM

## 2022-10-08 DIAGNOSIS — F4310 Post-traumatic stress disorder, unspecified: Secondary | ICD-10-CM | POA: Diagnosis not present

## 2022-10-08 MED ORDER — AMITRIPTYLINE HCL 100 MG PO TABS: 100.0000 mg | ORAL_TABLET | Freq: Every day | ORAL | 0 refills | Status: DC
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
## 2022-11-03 ENCOUNTER — Encounter: Payer: BC Managed Care – PPO | Admitting: Registered Nurse

## 2022-11-13 ENCOUNTER — Encounter: Payer: Self-pay | Admitting: *Deleted

## 2023-01-07 ENCOUNTER — Telehealth (HOSPITAL_BASED_OUTPATIENT_CLINIC_OR_DEPARTMENT_OTHER): Payer: BC Managed Care – PPO | Admitting: Psychiatry

## 2023-01-07 ENCOUNTER — Telehealth: Payer: Self-pay

## 2023-01-07 ENCOUNTER — Encounter (HOSPITAL_COMMUNITY): Payer: Self-pay | Admitting: Psychiatry

## 2023-01-07 VITALS — Wt 217.0 lb

## 2023-01-07 DIAGNOSIS — F331 Major depressive disorder, recurrent, moderate: Secondary | ICD-10-CM | POA: Diagnosis not present

## 2023-01-07 DIAGNOSIS — F4310 Post-traumatic stress disorder, unspecified: Secondary | ICD-10-CM | POA: Diagnosis not present

## 2023-01-07 DIAGNOSIS — F1210 Cannabis abuse, uncomplicated: Secondary | ICD-10-CM

## 2023-01-07 DIAGNOSIS — Z139 Encounter for screening, unspecified: Secondary | ICD-10-CM

## 2023-01-07 MED ORDER — AMITRIPTYLINE HCL 150 MG PO TABS: 150.0000 mg | ORAL_TABLET | Freq: Every day | ORAL | 0 refills | Status: DC

## 2023-01-07 NOTE — Patient Instructions (Signed)
Visit Information  Thank you for taking time to visit with me today. Please don't hesitate to contact me if I can be of assistance to you.   Following are the goals we discussed today:   Goals Addressed             This Visit's Progress    Care Coordination Activities       Interventions Today    Flowsheet Row Most Recent Value  General Interventions   General Interventions Discussed/Reviewed General Interventions Discussed, Durable Medical Equipment (DME), Doctor Visits  Doctor Visits Discussed/Reviewed Doctor Visits Discussed  [encouraged to see patient as recommended]  Education Interventions   Education Provided Provided Verbal Education  [importance of following up with PCP as recommended or at least annually]  Provided Verbal Education On When to see the doctor  Pharmacy Interventions   Pharmacy Dicussed/Reviewed Medication Adherence, Medications and their functions, Affording Medications, Pharmacy Topics Discussed     Care Coordination Interventions: Care Guide referral for patient reports is concerned about losing his home Patient Encouraged to contact PCP to get on schedule for follow up. Provided RN Care Coordinator contact information and encouraged to contact if anything comes up prior to next telephone call      Our next appointment is by telephone on 02/05/23 at 10:am  Please call the care guide team at [phone] if you need to cancel or reschedule your appointment.   If you are experiencing a Mental Health or [place] or need someone to talk to, please call the Suicide and Crisis Lifeline: [place], RN, MSN, BSN, [place] Management Coordinator [phone]
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
## 2023-01-07 NOTE — Patient Outreach (Signed)
  Care Coordination   Initial Visit Note   01/07/2023 Name: Daniel Marsh MRN: [id number] DOB: Mar 31, 1981  Daniel Marsh is a 42 y.o. year old male who sees [place], [place], [place] for primary care. I spoke with  Daniel Marsh by phone today.  What matters to the patients health and wellness today?  Daniel Marsh reports concern for losing his home. He reports he is delinquent on mortgage payments. Last PCP visit 06/09/2022-No upcoming PCP appointment noted in chart.    Goals Addressed             This Visit's Progress    Care Coordination Activities       Interventions Today    Flowsheet Row Most Recent Value  General Interventions   General Interventions Discussed/Reviewed General Interventions Discussed, Durable Medical Equipment (DME), Doctor Visits  Doctor Visits Discussed/Reviewed Doctor Visits Discussed  [encouraged to see patient as recommended]  Education Interventions   Education Provided Provided Verbal Education  [importance of following up with PCP as recommended or at least annually]  Provided Verbal Education On When to see the doctor  Pharmacy Interventions   Pharmacy Dicussed/Reviewed Medication Adherence, Medications and their functions, Affording Medications, Pharmacy Topics Discussed     Care Coordination Interventions: Care Guide referral for patient reports is concerned about losing his home Patient Encouraged to contact PCP to get on schedule for follow up. Provided RN Care Coordinator contact information and encouraged to contact if anything comes up prior to next telephone call      SDOH assessments and interventions completed:  Yes  SDOH Interventions Today    Flowsheet Row Most Recent Value  SDOH Interventions   Food Insecurity Interventions Intervention Not Indicated  Housing Interventions Other (Comment)  [Patient reports he is concerned about loosing his home. He reports: delinquent on mortgage payments. referral to care guide  for resources.]  Transportation Interventions Intervention Not Indicated  Utilities Interventions Intervention Not Indicated     Care Coordination Interventions:  Yes, provided   Follow up plan: Follow up call scheduled for 02/05/23    Encounter Outcome:  Pt. Visit Completed   Thea Silversmith, RN, MSN, BSN, [place] Coordinator [phone]

## 2023-01-09 ENCOUNTER — Telehealth: Payer: Self-pay | Admitting: *Deleted

## 2023-01-09 NOTE — Telephone Encounter (Signed)
   Telephone encounter was:  Unsuccessful.  01/09/2023 Name: Daniel Marsh MRN: [id number] DOB: Aug 17, 1981  Unsuccessful outbound call made today to assist with:  Financial Difficulties related to houding   Outreach Attempt:  1st Attempt  A HIPAA compliant voice message was left requesting a return call.  Instructed patient to call back at [phone].  [place] [phone] [address] , [place] [address] Email : [email or web address]

## 2023-01-13 ENCOUNTER — Telehealth: Payer: Self-pay | Admitting: *Deleted

## 2023-01-13 NOTE — Telephone Encounter (Signed)
   Telephone encounter was:  Unsuccessful.  01/13/2023 Name: Jennings Corado MRN: [id number] DOB: Aug 02, 1981  Unsuccessful outbound call made today to assist with:  Financial Difficulties related to housing   Outreach Attempt:  2nd Attempt  A HIPAA compliant voice message was left requesting a return call.  Instructed patient to call back at [phone].  [facility] [phone] [address] , [place] [address] Email : [email or web address]

## 2023-01-15 ENCOUNTER — Telehealth: Payer: Self-pay | Admitting: *Deleted

## 2023-01-15 NOTE — Telephone Encounter (Signed)
   Telephone encounter was:  Unsuccessful.  01/15/2023 Name: Karell Tukes MRN: [id number] DOB: 1981-01-11  Unsuccessful outbound call made today to assist with:  Financial Difficulties related to housing   Outreach Attempt:  3rd Attempt.  Referral closed unable to contact patient.  A HIPAA compliant voice message was left requesting a return call.  Instructed patient to call back at [phone].  [facility] [phone] [address] , [place] [address] Email : [email or web address]

## 2023-01-22 ENCOUNTER — Ambulatory Visit (HOSPITAL_COMMUNITY): Payer: BC Managed Care – PPO | Admitting: Clinical

## 2023-01-25 ENCOUNTER — Ambulatory Visit
Admission: EM | Admit: 2023-01-25 | Discharge: 2023-01-25 | Disposition: A | Discharge status: Home / Self Care | Payer: BC Managed Care – PPO | Attending: Internal Medicine | Admitting: Internal Medicine

## 2023-01-25 ENCOUNTER — Ambulatory Visit (INDEPENDENT_AMBULATORY_CARE_PROVIDER_SITE_OTHER): Payer: BC Managed Care – PPO

## 2023-01-25 DIAGNOSIS — M7989 Other specified soft tissue disorders: Secondary | ICD-10-CM | POA: Diagnosis not present

## 2023-01-25 DIAGNOSIS — S6992XA Unspecified injury of left wrist, hand and finger(s), initial encounter: Secondary | ICD-10-CM | POA: Diagnosis not present

## 2023-01-25 DIAGNOSIS — M79642 Pain in left hand: Secondary | ICD-10-CM

## 2023-01-25 DIAGNOSIS — M25532 Pain in left wrist: Secondary | ICD-10-CM | POA: Diagnosis not present

## 2023-01-25 DIAGNOSIS — W2209XA Striking against other stationary object, initial encounter: Secondary | ICD-10-CM

## 2023-01-25 NOTE — ED Triage Notes (Signed)
Pt c/o hitting wall on Friday left hand now swollen

## 2023-01-25 NOTE — ED Provider Notes (Signed)
[facility]    CSN: [id number] Arrival date & time: 01/25/23  1420      History   Chief Complaint Chief Complaint  Patient presents with   punched wall    HPI Daniel Marsh is a 42 y.o. male.   Patient presents left wrist and hand pain that occurred about 4 days ago after an injury.  Patient states that he got angry and punched a wall.  Reporting majority of pain overlying the second and third MCP joints.  Has full range of motion of hand.  Denies numbness or tingling.  Has not taken any medications for pain.     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 Authorizing Provider  acetaminophen (TYLENOL) 500 MG tablet Take 1,500-2,000 mg by mouth every 6 (six) hours as needed (For foot pain).    [provider]  amitriptyline (ELAVIL) 150 MG tablet Take 1 tablet (150 mg total) by mouth at bedtime. 01/07/23    Arfeen, Arlyce Harman, MD  atorvastatin (LIPITOR) 20 MG tablet Take 1 tablet (20 mg total) by mouth daily. 02/28/22   Inda Coke, PA  diazepam (VALIUM) 5 MG tablet Take 1 tablet (5 mg total) by mouth at bedtime as needed for anxiety. 06/09/22   Inda Coke, PA  dicyclomine (BENTYL) 10 MG capsule Take 1 capsule (10 mg total) by mouth 3 (three) times daily as needed (abdominal pain/ cramping). 10/17/21   Esterwood, Amy S, PA-C  mesalamine (APRISO) 0.375 g 24 hr capsule Take 1 capsule (0.375 g total) by mouth in the morning, at noon, in the evening, and at bedtime. 04/09/22   Esterwood, Amy S, PA-C  omeprazole (PRILOSEC) 40 MG capsule Take 1 capsule (40 mg total) by mouth daily. 07/09/20   Esterwood, Amy S, PA-C  ondansetron (ZOFRAN ODT) 4 MG disintegrating tablet Take 1 tablet (4 mg total) by mouth every 8 (eight) hours as needed for nausea or vomiting. 09/25/21   Godfrey Pick, MD  prazosin (MINIPRESS) 2 MG capsule Take 1 capsule (2 mg total) by mouth at bedtime. To be taken with the 1 mg capsule for a total of 3 mg. 02/28/22   Inda Coke, PA  pregabalin (LYRICA) 75 MG capsule Take 1 capsule (75 mg total) by mouth 3 (three) times  daily. 08/06/22   Bayard Hugger, NP  vortioxetine HBr (TRINTELLIX) 20 MG TABS tablet Take 1 tablet (20 mg total) by mouth daily. 02/28/22   Inda Coke, PA    Family History Family History  Problem Relation Age of Onset   Osteoarthritis Mother    Depression Mother    Heart attack Mother 57   Osteoarthritis Father    Depression Father    Bladder Cancer Father    Prostate cancer Father    Depression Brother    Depression Brother    Diabetes Maternal Grandmother    Hyperlipidemia Maternal Grandmother    Hypertension Maternal Grandmother    Heart attack Maternal Grandmother    Osteoarthritis Maternal Grandfather    COPD Maternal Grandfather    Kidney disease Maternal Grandfather    Renal cancer Maternal Grandfather    Alcohol abuse Paternal Grandmother     COPD Paternal Grandmother    Diabetes Paternal Grandmother    Hypertension Paternal Grandmother    Lung cancer Paternal Grandmother    Osteoarthritis Paternal Grandfather    Heart attack Paternal Grandfather    Heart failure Paternal Grandfather    Pancreatic cancer Maternal Aunt    Colon cancer Maternal Uncle    Lung cancer Paternal Aunt     Social History Social History   Tobacco Use   Smoking status: Every Day    Packs/day: 0.25    Years: 15.00    Total pack years: 3.75    Types: Cigarettes, Cigars   Smokeless tobacco: Current    Types: Chew   Tobacco comments:    I am usually a social smoker  Vaping Use   Vaping Use: Never used  Substance Use Topics   Alcohol use: Yes   Drug use: Yes    Types: Marijuana    Comment: Was using to self medicate for depression and anxiety     Allergies   Patient has no known allergies.   Review of Systems Review of Systems Per HPI  Physical Exam Triage Vital Signs ED Triage Vitals [01/25/23 1520]  Enc Vitals Group     BP (!) 146/89     Pulse Rate 100     Resp 16     Temp 98.8 F (37.1 C)     Temp Source Oral     SpO2 97 %     Weight      Height      Head Circumference      Peak Flow      Pain Score 7     Pain Loc      Pain Edu?      Excl. in [place]?    No data found.  Updated Vital Signs BP (!) 146/89 (BP Location: Left Arm)   Pulse 100   Temp 98.8 F (37.1 C) (Oral)   Resp 16   SpO2 97%   Visual Acuity Right Eye Distance:   Left Eye Distance:   Bilateral Distance:    Right Eye Near:   Left Eye Near:    Bilateral Near:     Physical Exam Constitutional:      General: He is not in acute distress.    Appearance: Normal appearance. He is not toxic-appearing or diaphoretic.  HENT:     Head: Normocephalic and atraumatic.  Eyes:     Extraocular Movements: Extraocular movements intact.     Conjunctiva/sclera: Conjunctivae normal.  Pulmonary:     Effort: Pulmonary effort is normal.  Musculoskeletal:  Comments: Tenderness to palpation to second and third MCP joints of left hand.  Moderate swelling and bruising noted to this area as well.  No erythema.  Patient also has tenderness to palpation throughout left wrist.  No swelling noted.  Full range of motion of wrist present.  Patient has full range of motion of fingers.  Capillary refill and pulses intact.  No lacerations or abrasions noted.  Neurological:     General: No focal deficit present.     Mental Status: He is alert and oriented to person, place, and time. Mental status is at baseline.  Psychiatric:        Mood and Affect: Mood normal.        Behavior: Behavior normal.        Thought Content: Thought content normal.        Judgment: Judgment normal.      UC Treatments / Results  Labs (all labs ordered are listed, but only abnormal results are displayed) Labs Reviewed - No data to display  EKG   Radiology DG Wrist Complete Left  Result Date: 01/25/2023 CLINICAL DATA:  Punched a wall 2 days ago.  Swelling and pain. EXAM: LEFT WRIST - COMPLETE 3+ VIEW; LEFT HAND - COMPLETE 3+ VIEW COMPARISON:  None Available. FINDINGS: Left wrist: Normal bone mineralization. 2 mm ulnar negative variance. Joint spaces are preserved. No acute fracture or dislocation. Left hand: Normal bone mineralization. Joint spaces are preserved. Minimal peripheral degenerative spurring at the index finger DIP joint. IMPRESSION: 1. No acute fracture. 2. 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 Electronically Signed   By: Yvonne Kendall M.D.   On: 01/25/2023 16:04   DG Hand Complete Left  Result Date: 01/25/2023 CLINICAL DATA:  Punched a wall 2 days ago.  Swelling and pain. EXAM: LEFT WRIST - COMPLETE 3+ VIEW; LEFT HAND - COMPLETE 3+ VIEW COMPARISON:  None Available. FINDINGS: Left wrist: Normal bone mineralization. 2 mm ulnar negative variance. Joint spaces are preserved. No acute fracture or dislocation. Left hand: Normal bone mineralization. Joint  spaces are preserved. Minimal peripheral degenerative spurring at the index finger DIP joint. IMPRESSION: 1. No acute fracture. 2. 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 Electronically Signed   By: Yvonne Kendall M.D.   On: 01/25/2023 16:04    Procedures Procedures (including critical care time)  Medications Ordered in UC Medications - No data to display  Initial Impression / Assessment and Plan / UC Course  I have reviewed the triage vital signs and the nursing notes.  Pertinent labs & imaging results that were available during my care of the patient were reviewed by me and considered in my medical decision making (see chart for details).     X-ray was negative for any acute fracture or dislocation.  It did show an ulnar variance which could be due to trauma but unsure if it is related.  Discussed patient's clinical course with supervising physician Dr. Lanny Cramp.  Will apply wrist brace for support and stability.  Advised patient not to sleep in this.  Advised supportive care including ice application, safe pain relievers, elevation of extremity.  Advised patient to follow-up with PCP.  Discussed return precautions.  Patient verbalized understanding and was agreeable with plan. Final Clinical Impressions(s) / UC Diagnoses   Final diagnoses:  Left wrist pain  Left hand pain     Discharge Instructions      No fractures noted.  Wrist brace applied.  Do not sleep in  this.  Apply ice and elevate extremity.  Follow-up with family doctor.    ED Prescriptions   None    PDMP not reviewed this encounter.   Teodora Medici, [place] 01/25/23 [phone]

## 2023-01-25 NOTE — Discharge Instructions (Signed)
No fractures noted.  Wrist brace applied.  Do not sleep in this.  Apply ice and elevate extremity.  Follow-up with family doctor.

## 2023-02-02 ENCOUNTER — Ambulatory Visit: Payer: Self-pay

## 2023-02-02 NOTE — Patient Instructions (Signed)
Visit Information  Thank you for taking time to visit with me today. Please don't hesitate to contact me if I can be of assistance to you.   Following are the goals we discussed today:   Goals Addressed             This Visit's Progress    Care Coordination Activities       Interventions Today    Flowsheet Row Most Recent Value  Chronic Disease   Chronic disease during today's visit Hypertension (HTN), Other  [depression]  General Interventions   General Interventions Discussed/Reviewed General Interventions Discussed, Doctor Visits  Doctor Visits Discussed/Reviewed Doctor Visits Discussed  Education Interventions   Education Provided Provided Education  Provided Verbal Education On [place] Discussed/Reviewed Mental Health Discussed, Depression  Pharmacy Interventions   Pharmacy Dicussed/Reviewed Medications and their functions     Care Coordination Interventions: Care Guide referral for patient reports is concerned about losing his home - patient has not followed up. Advised to follow up.  Phone number given to call back.   Patient Encouraged to contact PCP to get on schedule for follow up. Provided RN Care Coordinator contact information and encouraged to contact if anything comes up prior to next telephone call        Our next appointment is by telephone on 02/23/23 at 1100  Please call the care guide team at [phone] if you need to cancel or reschedule your appointment.   If you are experiencing a Mental Health or [place] or need someone to talk to, please call the Suicide and Crisis Lifeline: 988   The patient verbalized understanding of instructions, educational materials, and care plan provided today and agreed to receive a mailed copy of patient instructions, educational materials, and care plan.   The patient has been provided with contact information for the care management team and  has been advised to call with any health related questions or concerns.   Jone Baseman, RN, MSN [place] [facility] Care Management Coordinator Direct Line [phone]

## 2023-02-02 NOTE — Patient Outreach (Signed)
  Care Coordination   Initial Visit Note   02/02/2023 Name: Daniel Marsh MRN: [id number] DOB: 09-Apr-1981  Daniel Nordhoff is a 42 y.o. year old male who sees [place], [place], [place] for primary care. I spoke with  Daniel Marsh by phone today.  What matters to the patients health and wellness today?  Doing pretty good. Deals with depression on going.  To see new psychiatrist on 02-05-23.  He states counseling has helped.      Goals Addressed             This Visit's Progress    Care Coordination Activities       Interventions Today    Flowsheet Row Most Recent Value  Chronic Disease   Chronic disease during today's visit Hypertension (HTN), Other  [depression]  General Interventions   General Interventions Discussed/Reviewed General Interventions Discussed, Doctor Visits  Doctor Visits Discussed/Reviewed Doctor Visits Discussed  Education Interventions   Education Provided Provided Education  Provided Verbal Education On [place] Discussed/Reviewed Mental Health Discussed, Depression  Pharmacy Interventions   Pharmacy Dicussed/Reviewed Medications and their functions     Care Coordination Interventions: Care Guide referral for patient reports is concerned about losing his home - patient has not followed up. Advised to follow up.  Phone number given to call back.   Patient Encouraged to contact PCP to get on schedule for follow up. Provided RN Care Coordinator contact information and encouraged to contact if anything comes up prior to next telephone call        SDOH assessments and interventions completed:  Yes  SDOH Interventions Today    Flowsheet Row Most Recent Value  SDOH Interventions   Transportation Interventions Intervention Not Indicated  Depression Interventions/Treatment  Medication, Counseling, Currently on Treatment        Care Coordination Interventions:  Yes, provided    Follow up plan: Follow up call scheduled for 02/23/23    Encounter Outcome:  Pt. Visit Completed   Jone Baseman, RN, MSN [place] [facility] Care Management Coordinator Direct Line [phone]

## 2023-02-05 ENCOUNTER — Encounter (HOSPITAL_COMMUNITY): Payer: Self-pay

## 2023-02-05 ENCOUNTER — Ambulatory Visit (INDEPENDENT_AMBULATORY_CARE_PROVIDER_SITE_OTHER): Payer: BC Managed Care – PPO | Admitting: Clinical

## 2023-02-05 ENCOUNTER — Encounter (HOSPITAL_COMMUNITY): Payer: Self-pay | Admitting: Clinical

## 2023-02-05 DIAGNOSIS — F4310 Post-traumatic stress disorder, unspecified: Secondary | ICD-10-CM | POA: Diagnosis not present

## 2023-02-05 DIAGNOSIS — F331 Major depressive disorder, recurrent, moderate: Secondary | ICD-10-CM

## 2023-02-05 DIAGNOSIS — F1210 Cannabis abuse, uncomplicated: Secondary | ICD-10-CM | POA: Diagnosis not present

## 2023-02-05 DIAGNOSIS — F1010 Alcohol abuse, uncomplicated: Secondary | ICD-10-CM
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
## 2023-02-06 ENCOUNTER — Encounter: Payer: Self-pay | Admitting: Registered Nurse

## 2023-02-06 ENCOUNTER — Encounter: Payer: BC Managed Care – PPO | Attending: Registered Nurse | Admitting: Registered Nurse

## 2023-02-06 VITALS — BP 144/93 | HR 96 | Ht 72.0 in | Wt 235.0 lb

## 2023-02-06 DIAGNOSIS — M961 Postlaminectomy syndrome, not elsewhere classified: Secondary | ICD-10-CM

## 2023-02-06 DIAGNOSIS — M25562 Pain in left knee: Secondary | ICD-10-CM

## 2023-02-06 DIAGNOSIS — G8929 Other chronic pain: Secondary | ICD-10-CM | POA: Diagnosis not present

## 2023-02-06 DIAGNOSIS — M47816 Spondylosis without myelopathy or radiculopathy, lumbar region: Secondary | ICD-10-CM | POA: Diagnosis not present

## 2023-02-06 DIAGNOSIS — M25561 Pain in right knee: Secondary | ICD-10-CM

## 2023-02-06 DIAGNOSIS — M5416 Radiculopathy, lumbar region: Secondary | ICD-10-CM

## 2023-02-06 DIAGNOSIS — R269 Unspecified abnormalities of gait and mobility: Secondary | ICD-10-CM

## 2023-02-06 NOTE — Progress Notes (Signed)
Subjective:    Patient ID: Daniel Marsh, male    DOB: 12/25/1980, 42 y.o.   MRN: [id number]  HPI: Daniel Marsh is a 42 y.o. male who returns for follow up appointment for chronic pain and medication refill. He states his pain is located in his lower back radiating into his bilateral lower extremities. He reports increase intensity of  Lumbar radicular pain. He asked about injection, last injection was ESI on 07/04/2022, he had 5 months of relief of his pain. He will be scheduled with Dr Daniel Marsh for Lumbosacral transforaminal epidural steroid injection under fluoroscopic guidance with contrast enhancement : He verbalizes understanding. He also reports bilateral knee pain.   He rates his pain 7. current exercise regime is walking and performing stretching exercises.    Pain Inventory Average Pain 7 Pain Right Now 7 My pain is constant, sharp, dull, tingling, and aching  In the last 24 hours, has pain interfered with the following? General activity 7 Relation with others 7 Enjoyment of life 7 What TIME of day is your pain at its worst? morning , daytime, evening, and night Sleep (in general) Poor  Pain is worse with: walking, bending, inactivity, standing, and some activites Pain improves with: rest and TENS Relief from Meds: 5  Family History  Problem Relation Age of Onset   Osteoarthritis Mother    Depression Mother    Heart attack Mother 19   Osteoarthritis Father    Depression Father    Bladder Cancer Father    Prostate cancer Father    Depression Brother    Depression Brother    Diabetes Maternal Grandmother    Hyperlipidemia Maternal Grandmother    Hypertension Maternal Grandmother    Heart attack Maternal Grandmother    Osteoarthritis Maternal Grandfather    COPD Maternal Grandfather    Kidney disease Maternal Grandfather    Renal cancer Maternal Grandfather    Alcohol abuse Paternal Grandmother    COPD Paternal Grandmother    Diabetes Paternal  Grandmother    Hypertension Paternal Grandmother    Lung cancer Paternal Grandmother    Osteoarthritis Paternal Grandfather    Heart attack Paternal Grandfather    Heart failure Paternal Grandfather    Pancreatic cancer Maternal Aunt    Colon cancer Maternal Uncle    Lung cancer Paternal Aunt    Social History   Socioeconomic History   Marital status: Married    Spouse name: Not on file   Number of children: 3   Years of education: Not on file   Highest education level: Not on file  Occupational History   Not on file  Tobacco Use   Smoking status: Every Day    Packs/day: 0.25    Years: 15.00    Total pack years: 3.75    Types: Cigarettes, Cigars   Smokeless tobacco: Current    Types: Chew   Tobacco comments:    I am usually a social smoker  Vaping Use   Vaping Use: Never used  Substance and Sexual Activity   Alcohol use: Yes    Alcohol/week: 12.0 standard drinks of alcohol    Types: 12 Cans of beer per week   Drug use: Yes    Types: Marijuana    Comment: Was using to self medicate for depression and anxiety   Sexual activity: Yes    Birth control/protection: I.U.D.    Comment: My wife currently has the [facility] IUD  Other Topics Concern   Not on file  Social History  Narrative   ** Merged History Encounter **       Was in the [employer] for 7 years, required lumbar surgery while in the [employer] and had poor outcome Divorced and re-married 72 yo son    Social Determinants of Radio broadcast assistant Strain: Not on file  Food Insecurity: No Food Insecurity (01/07/2023)   Hunger Vital Sign    Worried About Running Out of Food in the Last Year: Never true    Ran Out of Food in the Last Year: Never true  Transportation Needs: No Transportation Needs (02/02/2023)   PRAPARE - Hydrologist (Medical): No    Lack of Transportation (Non-Medical): No  Physical Activity: Not on file  Stress: Not on file  Social Connections: Not on file   Past  Surgical History:  Procedure Laterality Date   ANKLE SURGERY Right 2011   KNEE SURGERY Left 2020   [place]  2013   Lumbar fusion L4-S1   WISDOM TOOTH EXTRACTION     Past Surgical History:  Procedure Laterality Date   ANKLE SURGERY Right 2011   KNEE SURGERY Left 2020   SPINE SURGERY  2013   Lumbar fusion L4-S1   WISDOM TOOTH EXTRACTION     Past Medical History:  Diagnosis Date   Anemia    Anxiety    Arthritis    Depression    Hypertension    Liver disease    Myocardial infarction ([place])    PTSD (post-traumatic stress disorder)    Ulcerative colitis ([place]) 2019   BP (!) 144/93   Pulse 96   Ht 6' (1.829 m)   Wt 235 lb (106.6 kg)   SpO2 99%   BMI 31.87 kg/m   Opioid Risk Score:   Fall Risk Score:  `1  Depression screen [facility] 2/9     02/05/2023   12:23 PM 02/02/2023    1:05 PM 06/24/2022    2:55 PM 06/13/2022    9:57 AM 06/10/2022    3:03 PM 06/09/2022    2:17 PM 02/11/2022    3:30 PM  Depression screen PHQ 2/9  Decreased Interest  3 0 '1 3 2 3  '$ Down, Depressed, Hopeless  3 0 '1 3 3 3  '$ PHQ - 2 Score  6 0 '2 6 5 6  '$ Altered sleeping  3 0   3   Tired, decreased energy  3 0   3   Change in appetite  1 0   3   Feeling bad or failure about yourself   3 0   3   Trouble concentrating  3 0   2   Moving slowly or fidgety/restless  3 0   2   Suicidal thoughts  0 0   1   PHQ-9 Score  22 0   22   Difficult doing work/chores   Not difficult at all   Very difficult      Information is confidential and restricted. Go to Review Flowsheets to unlock data.     Review of Systems  Musculoskeletal:  Positive for back pain.       B/L knees, LT leg  All other systems reviewed and are negative.      Objective:   Physical Exam Vitals and nursing note reviewed.  Constitutional:      Appearance: Normal appearance.  Cardiovascular:     Rate and Rhythm: Normal rate and regular rhythm.     Pulses: Normal pulses.  Heart sounds: Normal heart sounds.  Pulmonary:     Effort:  Pulmonary effort is normal.     Breath sounds: Normal breath sounds.  Musculoskeletal:     Cervical back: Normal range of motion and neck supple.     Comments: Normal Muscle Bulk and Muscle Testing Reveals:  Upper Extremities: Full ROM and Muscle Strength 5/5  Lumbar Hypersensitivity Lower Extremities: Full ROM and Muscle Strength 5/5 Arises from Table slowly Antalgic  Gait     Skin:    General: Skin is warm and dry.  Neurological:     Mental Status: He is alert and oriented to person, place, and time.  Psychiatric:        Mood and Affect: Mood normal.        Behavior: Behavior normal.         Assessment & Plan:  1.Failed Back Syndrome: Continue HEP as Tolerated. Continue current medication regimen. Continue to monitor. 02/06/2023 2.Facet Arthropathy, Lumbar: Continue HEP as Tolerated. Continue current medication regimen. Continue to monitor. 02/06/2023 3.Lumbar Radiculitis: Schedule: with Dr Daniel Marsh Lumbosacral transforaminal epidural steroid injection under fluoroscopic guidance with contrast enhancement   Indication: Lumbosacral radiculitis is not relieved by medication management or other conservative care and interfering with self-care and mobility.       Continue  Gabapentin at this time then he will resume his Lyrica. He verbalizes understanding.  Continue to Monitor. 02/06/2023 4. Abnormality of Gait: Continue using cane. Continue to monitor. 02/06/2023 5. Chronic Bilateral Knee pain: L>R: Orthopedist Following  Continue to monitor 02/06/2023 6.Chronic Left Hip Pain:  Continue to Monitor. 02/06/2023 7. Sleep Disturbance: Continue Elavil at HS. Continue to Monitor. 02/06/2023   F/U with Dr Daniel Marsh

## 2023-02-16 ENCOUNTER — Encounter: Payer: Self-pay | Admitting: Gastroenterology

## 2023-02-16 ENCOUNTER — Other Ambulatory Visit (INDEPENDENT_AMBULATORY_CARE_PROVIDER_SITE_OTHER): Payer: BC Managed Care – PPO

## 2023-02-16 ENCOUNTER — Ambulatory Visit: Payer: BC Managed Care – PPO | Admitting: Gastroenterology

## 2023-02-16 VITALS — BP 130/70 | HR 109 | Ht 72.0 in | Wt 237.0 lb

## 2023-02-16 DIAGNOSIS — K602 Anal fissure, unspecified: Secondary | ICD-10-CM

## 2023-02-16 DIAGNOSIS — K51911 Ulcerative colitis, unspecified with rectal bleeding: Secondary | ICD-10-CM | POA: Diagnosis not present

## 2023-02-16 DIAGNOSIS — K645 Perianal venous thrombosis: Secondary | ICD-10-CM | POA: Diagnosis not present

## 2023-02-16 LAB — CBC
HCT: 44.7 % (ref 39.0–52.0)
Hemoglobin: 14.7 g/dL (ref 13.0–17.0)
MCHC: 32.9 g/dL (ref 30.0–36.0)
MCV: 88.7 fl (ref 78.0–100.0)
Platelets: 281 10*3/uL (ref 150.0–400.0)
RBC: 5.04 Mil/uL (ref 4.22–5.81)
RDW: 17.1 % — ABNORMAL HIGH (ref 11.5–15.5)
WBC: 5.8 10*3/uL (ref 4.0–10.5)

## 2023-02-16 LAB — BASIC METABOLIC PANEL
BUN: 10 mg/dL (ref 6–23)
CO2: 29 mEq/L (ref 19–32)
Calcium: 9.4 mg/dL (ref 8.4–10.5)
Chloride: 105 mEq/L (ref 96–112)
Creatinine, Ser: 1.13 mg/dL (ref 0.40–1.50)
GFR: 82.03 mL/min (ref >60.00)
Glucose, Bld: 87 mg/dL (ref 70–99)
Potassium: 4.5 mEq/L (ref 3.5–5.1)
Sodium: 140 mEq/L (ref 135–145)

## 2023-02-16 LAB — C-REACTIVE PROTEIN: CRP: <1 mg/dL (ref 0.5–20.0)

## 2023-02-16 LAB — SEDIMENTATION RATE: Sed Rate: 16 mm/hr — ABNORMAL HIGH (ref 0–15)

## 2023-02-16 MED ORDER — AMBULATORY NON FORMULARY MEDICATION: 1 refills | Status: DC

## 2023-02-16 MED ORDER — PREDNISONE 10 MG PO TABS: ORAL_TABLET | ORAL | 0 refills | Status: AC

## 2023-02-16 NOTE — Progress Notes (Signed)
Chief Complaint:    Ulcerative Colitis, symptomatic hemorrhoids  GI History: 42 year old male with a history of CAD, PTSD, anxiety/depression, HTN, lumbar fusion.   Ulcerative Colitis diagnosed at a outside facility in 2019, and has been maintained on Apriso monotherapy.  Flare in 07/2020 requiring steroids with taper.  - 07/2020: Colonoscopy: 4 mm sigmoid hyperplastic polyp, mild scattered sigmoid erythema (path benign, no active/chronic inflammation).  Grade 3 internal hemorrhoids and external hemorrhoids  - Underwent hemorrhoid banding x 2 in 2022 along with treatment of external anal fissure in 03/2021 - 09/25/2021: ER evaluation for bloody diarrhea.  CT with hepatic cysts and hepatic hemangioma, diverticulosis, but otherwise no wall thickening.  Normal CBC, CMP, lipase.  Was treated with short course of steroids - 10/2021: Follow-up in the GI clinic.  Was having 4-6 loose stools daily with blood mixed in the stool x 6 weeks, all consistent with flare.  Normal ESR, CRP.  Started on prednisone with prolonged taper.  Did not complete fecal calprotectin.  HPI:     Patient is a 42 y.o. male presenting to the Gastroenterology Clinic for follow-up.  Was last seen by Nicoletta Ba on 10/17/2021.  Was having a flare at that time and treated with prednisone with prolonged taper.  Never followed up afterwards.  Today, he states had UC flare symptoms 2 weeks ago. Having 5-6 stools/day, bloody stools. 2-3 nocturnal stools. Lower abdominal pain. Still taking Apriso as prescribed.   Separately, thinks he had a thrombosed hemorrhoid over this same time with leakage. The leakage stopped 1 week ago, but still with perianal pain.  Has treated with sitz bath's, but ongoing symptoms.  HR 109, otherwise HD stable.   Review of systems:     No chest pain, no SOB, no fevers, no urinary sx   Past Medical History:  Diagnosis Date   Anemia    Anxiety    Arthritis    Depression    Hypertension    Liver  disease    Myocardial infarction [facility])    PTSD (post-traumatic stress disorder)    Ulcerative colitis ([place]) 2019    Patient's surgical history, family medical history, social history, medications and allergies were all reviewed in Epic    Current Outpatient Medications  Medication Sig Dispense Refill   acetaminophen (TYLENOL) 500 MG tablet Take 1,500-2,000 mg by mouth every 6 (six) hours as needed (For foot pain).     amitriptyline (ELAVIL) 150 MG tablet Take 1 tablet (150 mg total) by mouth at bedtime. 90 tablet 0   atorvastatin (LIPITOR) 20 MG tablet Take 1 tablet (20 mg total) by mouth daily. 90 tablet 0   diazepam (VALIUM) 5 MG tablet Take 1 tablet (5 mg total) by mouth at bedtime as needed for anxiety. 30 tablet 1   dicyclomine (BENTYL) 10 MG capsule Take 1 capsule (10 mg total) by mouth 3 (three) times daily as needed (abdominal pain/ cramping). 90 capsule 3   mesalamine (APRISO) 0.375 g 24 hr capsule Take 1 capsule (0.375 g total) by mouth in the morning, at noon, in the evening, and at bedtime. 120 capsule 6   omeprazole (PRILOSEC) 40 MG capsule Take 1 capsule (40 mg total) by mouth daily. 90 capsule 3   ondansetron (ZOFRAN ODT) 4 MG disintegrating tablet Take 1 tablet (4 mg total) by mouth every 8 (eight) hours as needed for nausea or vomiting. 20 tablet 0   prazosin (MINIPRESS) 2 MG capsule Take 1 capsule (2 mg total) by mouth at  bedtime. To be taken with the 1 mg capsule for a total of 3 mg. 90 capsule 1   pregabalin (LYRICA) 75 MG capsule Take 1 capsule (75 mg total) by mouth 3 (three) times daily. 90 capsule 0   vortioxetine HBr (TRINTELLIX) 20 MG TABS tablet Take 1 tablet (20 mg total) by mouth daily. 90 tablet 1   No current facility-administered medications for this visit.    Physical Exam:     BP 130/70   Pulse (!) 109   Ht 6' (1.829 m)   Wt 237 lb (107.5 kg)   BMI 32.14 kg/m   GENERAL:  Pleasant male in NAD PSYCH: : Cooperative, normal affect Musculoskeletal:   Normal muscle tone, normal strength NEURO: Alert and oriented x 3, no focal neurologic deficits Rectal exam: Medium sized thrombosed external hemorrhoid with TTP in LL position.  External anal fissure in the RP position.  (Chaperone: Renee Rival, CMA).     IMPRESSION and PLAN:    1) Ulcerative Colitis Symptoms consistent with UC flare.  Has been responsive to steroids in the past.  Plan for the following: - Prednisone 40 mg daily x 2 weeks, then reduce by 10 mg every 7 days - Continue Apriso for now - Check CBC, BMP, ESR, CRP - Check fecal calprotectin, stool culture, C. difficile - Discussed potentially needing to escalate therapy depending on clinical response - Will eventually need repeat colonoscopy, but certainly not able to accomplish that now with the external anal fissure and thrombosed hemorrhoid which take precedence   2) Thrombosed hemorrhoid - Urgent referral to surgery - Provided RectiCare in the office today for relief in the interim - Sitz bath's  3) Anal fissure -Start topical NTG 0.125%.  Apply a small, pea-sized amount to the anal fissure bid for the next 6 weeks  4) GERD - Well-controlled on current therapy  RTC in 3 months or sooner as needed         Lavena Bullion ,DO, FACG 02/16/2023, 3:40 PM

## 2023-02-16 NOTE — Patient Instructions (Addendum)
Your provider has requested that you go to the basement level for lab work before leaving today. Press "B" on the elevator. The lab is located at the first door on the left as you exit the elevator.   We have sent a prescription for nitroglycerin 0.125% gel to [facility]. You should apply a pea size amount to your rectum three times daily x 6-8 weeks.  [facility]'s information is below: Address: [address], [place], [place] [address]  [phone]  *Please DO NOT go directly from our office to pick up this medication! Give the pharmacy 1 day to process the prescription as this is compounded and takes time to make.   Please purchase the following medications over the counter and take as directed: Recticare  You will be contacted by [facility] to schedule a surgery for Thrombosed Hemorrhoid.  [facility] is located at [address].   Phone No# [phone].   _______________________________________________________  If your blood pressure at your visit was 140/90 or greater, please contact your primary care physician to follow up on this.  _______________________________________________________  If you are age 40 or younger, your body mass index should be between 19-25. Your Body mass index is 32.14 kg/m. If this is out of the aformentioned range listed, please consider follow up with your Primary Care Provider.   __________________________________________________________  The [facility] providers would like to encourage you to use [facility] to communicate with providers for non-urgent requests or questions.  Due to long hold times on the telephone, sending your provider a message by [facility] may be a faster and more efficient way to get a response.  Please allow 48 business hours for a response.  Please remember that this is for non-urgent requests.   Due to recent changes in healthcare laws, you may see the results  of your imaging and laboratory studies on MyChart before your provider has had a chance to review them.  We understand that in some cases there may be results that are confusing or concerning to you. Not all laboratory results come back in the same time frame and the provider may be waiting for multiple results in order to interpret others.  Please give [place] 48 hours in order for your provider to thoroughly review all the results before contacting the office for clarification of your results.     Thank you for choosing me and [facility].  Vito Cirigliano, D.O.

## 2023-02-17 ENCOUNTER — Encounter: Payer: Self-pay | Admitting: Gastroenterology

## 2023-02-23 ENCOUNTER — Ambulatory Visit: Payer: Self-pay

## 2023-02-23 NOTE — Patient Outreach (Signed)
  Care Coordination   02/23/2023 Name: Daniel Marsh MRN: [id number] DOB: 12/02/1980   Care Coordination Outreach Attempts:  An unsuccessful telephone outreach was attempted today to offer the patient information about available care coordination services as a benefit of their health plan.   Follow Up Plan:  Additional outreach attempts will be made to offer the patient care coordination information and services.   Encounter Outcome:  No Answer   Care Coordination Interventions:  No, not indicated    Jone Baseman, RN, MSN [facility] Care Management Coordinator Direct Line [phone]

## 2023-03-04 ENCOUNTER — Ambulatory Visit: Payer: Self-pay

## 2023-03-04 NOTE — Patient Outreach (Signed)
  Care Coordination   03/04/2023 Name: Daniel Marsh MRN: [id number] DOB: 22-Apr-1981   Care Coordination Outreach Attempts:  A second unsuccessful outreach was attempted today to offer the patient with information about available care coordination services as a benefit of their health plan.     Follow Up Plan:  Additional outreach attempts will be made to offer the patient care coordination information and services.   Encounter Outcome:  No Answer   Care Coordination Interventions:  No, not indicated    Jone Baseman, RN, MSN [facility] Care Management Coordinator Direct Line [phone]

## 2023-03-10 ENCOUNTER — Encounter: Payer: Self-pay | Admitting: Physical Medicine & Rehabilitation

## 2023-03-10 ENCOUNTER — Encounter
Payer: BC Managed Care – PPO | Attending: Physical Medicine & Rehabilitation | Admitting: Physical Medicine & Rehabilitation

## 2023-03-10 VITALS — Temp 98.3°F | Ht 72.0 in | Wt 239.0 lb

## 2023-03-10 DIAGNOSIS — G8929 Other chronic pain: Secondary | ICD-10-CM | POA: Diagnosis not present

## 2023-03-10 DIAGNOSIS — M545 Low back pain, unspecified: Secondary | ICD-10-CM

## 2023-03-10 MED ORDER — IOHEXOL 180 MG/ML  SOLN
3.0000 mL | Freq: Once | INTRAMUSCULAR | Status: AC
  Administered 2023-03-10: 3 mL via INTRAVENOUS

## 2023-03-10 MED ORDER — LIDOCAINE HCL 1 % IJ SOLN
5.0000 mL | Freq: Once | INTRAMUSCULAR | Status: AC
  Administered 2023-03-10: 5 mL

## 2023-03-10 MED ORDER — LIDOCAINE HCL (PF) 1 % IJ SOLN
2.0000 mL | Freq: Once | INTRAMUSCULAR | Status: AC
  Administered 2023-03-10: 2 mL

## 2023-03-10 MED ORDER — DEXAMETHASONE SODIUM PHOSPHATE 10 MG/ML IJ SOLN
10.0000 mg | Freq: Once | INTRAMUSCULAR | Status: AC
  Administered 2023-03-10: 10 mg via INTRAVENOUS

## 2023-03-10 NOTE — Progress Notes (Signed)
  PROCEDURE RECORD [facility]   Name: Daniel Marsh DOB:Jan 25, 1981 MRN: [id number]  Date:03/10/2023  Physician: Claudette Laws, MD    Nurse/CMA: Aleina Burgio S  Allergies: No Known Allergies  Consent Signed: Yes.    Is patient diabetic? No.  CBG today? N/a  Pregnant: No. LMP: No LMP for male patient. (age 40-55)  Anticoagulants: no Anti-inflammatory: no Antibiotics: no  Procedure: Left Transforaminal Epidural Steroid Injection  Position: Prone Start Time: 10:17  End Time: 10:20  Fluoro Time: 15  RN/CMA Semaje Kinker S Ekaterini Capitano S    Time 10:05 155/77    BP 134/87 108    Pulse 112 97    Respirations 16 16    O2 Sat 96 97    S/S 6 6    Pain Level 8/10 6/10     D/C home with Wife, patient A & O X 3, D/C instructions reviewed, and sits independently.         Subjective:    Patient ID: Daniel Marsh, male    DOB: 1981/07/04, 42 y.o.   MRN: [id number]  HPI    Review of Systems     Objective:   Physical Exam        Assessment & Plan:

## 2023-03-10 NOTE — Progress Notes (Signed)
Left S1 sacral transforaminal epidural steroid injection under fluoroscopic guidance with contrast enhancement  Indication: Lumbosacral radiculitis is not relieved by medication management or other conservative care and interfering with self-care and mobility.   Informed consent was obtained after describing risk and benefits of the procedure with the patient, this includes bleeding, bruising, infection, paralysis and medication side effects.  The patient wishes to proceed and has given written consent.  Patient was placed in prone position.  The lumbar area was marked and prepped with Betadine.  It was entered with a 25-gauge 1-1/2 inch needle and one mL of 1% lidocaine was injected into the skin and subcutaneous tissue.  Then a 22-gauge 3.5in spinal needle was inserted into the Left S1 foramen intervertebral foramen under AP, lateral, and oblique view.  Once needle tip was within the foramen on lateral views an dnor exceeding 6 o clock position on th epedical on AP viewed Isovue 200 was inected x 9ml Then a solution containing one mL of 10 mg per mL dexamethasone and 2 mL of 1% lidocaine was injected.  The patient tolerated procedure well.  Post procedure instructions were given.  Please see post procedure form.   Dexamethasone 10mg  Lidocaine PF 42ml Lidocaine 1% 59ml

## 2023-03-10 NOTE — Patient Instructions (Signed)

## 2023-03-17 DIAGNOSIS — K51911 Ulcerative colitis, unspecified with rectal bleeding: Secondary | ICD-10-CM | POA: Diagnosis not present

## 2023-03-17 DIAGNOSIS — K600 Acute anal fissure: Secondary | ICD-10-CM | POA: Diagnosis not present

## 2023-03-17 DIAGNOSIS — K648 Other hemorrhoids: Secondary | ICD-10-CM | POA: Diagnosis not present

## 2023-04-06 ENCOUNTER — Telehealth: Payer: Self-pay | Admitting: *Deleted

## 2023-04-06 NOTE — Progress Notes (Signed)
  Care Coordination Note  04/06/2023 Name: Daniel Marsh MRN: [id number] DOB: 22-Oct-1981  Daniel Marsh is a 42 y.o. year old male who is a primary care patient of Jarold Motto, [place] and is actively engaged with the care management team. I reached out to Daniel Forse by phone today to assist with re-scheduling a follow up visit with the RN Case Manager  Follow up plan: Unsuccessful telephone outreach attempt made. A HIPAA compliant phone message was left for the patient providing contact information and requesting a return call.   Burman Nieves, CCMA Care Coordination Care Guide Direct Dial: [phone]

## 2023-04-07 ENCOUNTER — Telehealth (HOSPITAL_BASED_OUTPATIENT_CLINIC_OR_DEPARTMENT_OTHER): Payer: BC Managed Care – PPO | Admitting: Psychiatry

## 2023-04-07 ENCOUNTER — Encounter (HOSPITAL_COMMUNITY): Payer: Self-pay | Admitting: Psychiatry

## 2023-04-07 VITALS — Wt 239.0 lb

## 2023-04-07 DIAGNOSIS — F4310 Post-traumatic stress disorder, unspecified: Secondary | ICD-10-CM | POA: Diagnosis not present

## 2023-04-07 DIAGNOSIS — F331 Major depressive disorder, recurrent, moderate: Secondary | ICD-10-CM

## 2023-04-07 DIAGNOSIS — F1210 Cannabis abuse, uncomplicated: Secondary | ICD-10-CM | POA: Diagnosis not present

## 2023-04-07 MED ORDER — AMITRIPTYLINE HCL 150 MG PO TABS: 150.0000 mg | ORAL_TABLET | Freq: Every day | ORAL | 0 refills | Status: DC

## 2023-04-07 NOTE — Progress Notes (Signed)
[facility] MD Virtual Progress Note   Patient Location: In Car Provider Location: Home Office  I connect with patient by video and verified that I am speaking with correct person by using two identifiers. I discussed the limitations of evaluation and management by telemedicine and the availability of in person appointments. I also discussed with the patient that there may be a patient responsible charge related to this service. The patient expressed understanding and agreed to proceed.  Daniel Marsh [id number] 42 y.o.  04/07/2023 2:49 PM  History of Present Illness:  Patient is evaluated by video session.  He is pleased that disability is approved but is still have ruminative thoughts about his limitation of his physical activity.  He tried to take the pit in his backyard but could not do due to pain and numbness and tingling.  However he noticed feeling somewhat better and not getting upset or irritable and trying to go outside and socialize.  Last week he celebrated his mother's 70th birthday and bought the gift.  He was very pleased but also later on disappointed because his father did not show up on the birthday.  He occasionally have nightmares and flashback but symptoms are manageable.  He started therapy with Ms. Lucretia Field.  He is taking Trintellix, Valium and Minipress from primary care doctor.  He feels extra income from the disability has been helpful.  Denies any agitation, anger, mania, psychosis.  He is taking amitriptyline which is helping his sleep, nightmares and flashback.  He admitted continue to smoke marijuana but stable and has not increase the frequency.  He has no tremors, shakes or any EPS.  He like to continue to amitriptyline.  Past Psychiatric History: H/O depression, PTSD and anxiety. 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 Tried Prozac and Wellbutrin but did not like, Zoloft caused sexual side effects and Cymbalta worked for a while. H/O group  therapy.  No history of suicidal attempt or inpatient treatment. H/O DUI and incarceration for 9 months in military for the charges of sexual assault and indecent exposure.    Outpatient Encounter Medications as of 04/07/2023  Medication Sig   acetaminophen (TYLENOL) 500 MG tablet Take 1,500-2,000 mg by mouth every 6 (six) hours as needed (For foot pain).   AMBULATORY NON FORMULARY MEDICATION Medication Name: Nitroglycerin 0.125 Apply a pea-sized amount to the affected area twice daily for 8 weeks   amitriptyline (ELAVIL) 150 MG tablet Take 1 tablet (150 mg total) by mouth at bedtime.   atorvastatin (LIPITOR) 20 MG tablet Take 1 tablet (20 mg total) by mouth daily.   diazepam (VALIUM) 5 MG tablet Take 1 tablet (5 mg total) by mouth at bedtime as needed for anxiety.   dicyclomine (BENTYL) 10 MG capsule Take 1 capsule (10 mg total) by mouth 3 (three) times daily as needed (abdominal pain/ cramping).   mesalamine (APRISO) 0.375 g 24 hr capsule Take 1 capsule (0.375 g total) by mouth in the morning, at noon, in the evening, and at bedtime.   omeprazole (PRILOSEC) 40 MG capsule Take 1 capsule (40 mg total) by mouth daily.   ondansetron (ZOFRAN ODT) 4 MG disintegrating tablet Take 1 tablet (4 mg total) by mouth every 8 (eight) hours as needed for nausea or vomiting.   prazosin (MINIPRESS) 2 MG capsule Take 1 capsule (2 mg total) by mouth at bedtime. To be taken with the 1 mg capsule for a total of 3 mg.   pregabalin (LYRICA) 75 MG capsule Take 1 capsule (  75 mg total) by mouth 3 (three) times daily.   vortioxetine HBr (TRINTELLIX) 20 MG TABS tablet Take 1 tablet (20 mg total) by mouth daily.   No facility-administered encounter medications on file as of 04/07/2023.    Recent Results (from the past 2160 hour(s))  C-reactive protein     Status: None   Collection Time: 02/16/23  4:23 PM  Result Value Ref Range   CRP <1.0 0.5 - 20.0 mg/dL  Sed Rate (ESR)     Status: Abnormal   Collection Time: 02/16/23   4:23 PM  Result Value Ref Range   Sed Rate 16 (H) 0 - 15 mm/hr  Basic Metabolic Panel (BMET)     Status: None   Collection Time: 02/16/23  4:23 PM  Result Value Ref Range   Sodium 140 135 - 145 mEq/L   Potassium 4.5 3.5 - 5.1 mEq/L   Chloride 105 96 - 112 mEq/L   CO2 29 19 - 32 mEq/L   Glucose, Bld 87 70 - 99 mg/dL   BUN 10 6 - 23 mg/dL   Creatinine, Ser 1.61 0.40 - 1.50 mg/dL   GFR 09.60 >45.40 mL/min    Comment: Calculated using the CKD-EPI Creatinine Equation (2021)   Calcium 9.4 8.4 - 10.5 mg/dL  CBC     Status: Abnormal   Collection Time: 02/16/23  4:23 PM  Result Value Ref Range   WBC 5.8 4.0 - 10.5 K/uL   RBC 5.04 4.22 - 5.81 Mil/uL   Platelets 281.0 150.0 - 400.0 K/uL   Hemoglobin 14.7 13.0 - 17.0 g/dL   HCT 98.1 19.1 - 47.8 %   MCV 88.7 78.0 - 100.0 fl   MCHC 32.9 30.0 - 36.0 g/dL   RDW 29.5 (H) 62.1 - 30.8 %     Psychiatric Specialty Exam: Physical Exam  Review of Systems  Musculoskeletal:  Positive for back pain.  Neurological:  Positive for numbness.    Weight 239 lb (108.4 kg).There is no height or weight on file to calculate BMI.  General Appearance: Casual  Eye Contact:  Good  Speech:  Slow  Volume:  Normal  Mood:  Dysphoric  Affect:  Congruent  Thought Process:  Goal Directed  Orientation:  Full (Time, Place, and Person)  Thought Content:  Rumination  Suicidal Thoughts:  No  Homicidal Thoughts:  No  Memory:  Immediate;   Good Recent;   Good Remote;   Good  Judgement:  Intact  Insight:  Present  Psychomotor Activity:  Normal  Concentration:  Concentration: Good and Attention Span: Good  Recall:  Good  Fund of Knowledge:  Good  Language:  Good  Akathisia:  No  Handed:  Right  AIMS (if indicated):     Assets:  Communication Skills Desire for Improvement Housing Social Support Transportation  ADL's:  Intact  Cognition:  WNL  Sleep:  4-5 hrs     Assessment/Plan: MDD (major depressive disorder), recurrent episode, moderate (HCC) -  Plan: amitriptyline (ELAVIL) 150 MG tablet  PTSD (post-traumatic stress disorder) - Plan: amitriptyline (ELAVIL) 150 MG tablet  Cannabis use disorder, mild, abuse  Patient doing better and is stable on his medication.  He has physical limitation pain and numbness but mentally he is somewhat stable.  He is pleased his disability approved.  He started going outside and trying to socialize with people.  He is taking Minipress, Trintellix and Valium from his primary care doctor.  I encouraged to continue therapy with Ms. Lucretia Field to help his  coping skills.  Continue amitriptyline at bedtime.  Recommend to call us back if is any question or any concern.  Follow-up in 3 months.   Follow Up Instructions:     I discussed the assessment and treatment plan with the patient. The patient was provided an opportunity to ask questions and all were answered. The patient agreed with the plan and demonstrated an understanding of the instructions.   The patient was advised to call back or seek an in-person evaluation if the symptoms worsen or if the condition fails to improve as anticipated.    Collaboration of Care: Other provider involved in patient's care AEB notes are available in epic to review.  Patient/Guardian was advised Release of Information must be obtained prior to any record release in order to collaborate their care with an outside provider. Patient/Guardian was advised if they have not already done so to contact the registration department to sign all necessary forms in order for [place] to release information regarding their care.   Consent: Patient/Guardian gives verbal consent for treatment and assignment of benefits for services provided during this visit. Patient/Guardian expressed understanding and agreed to proceed.     I provided 18 minutes of non face to face time during this encounter.  Note: This document was prepared by [employer] voice dictation technology and any errors that results from  this process are unintentional.    Cleotis Nipper, MD 04/07/2023

## 2023-04-17 ENCOUNTER — Encounter (HOSPITAL_COMMUNITY): Payer: Self-pay | Admitting: Clinical

## 2023-04-17 ENCOUNTER — Ambulatory Visit (INDEPENDENT_AMBULATORY_CARE_PROVIDER_SITE_OTHER): Payer: BC Managed Care – PPO | Admitting: Clinical

## 2023-04-17 DIAGNOSIS — F4310 Post-traumatic stress disorder, unspecified: Secondary | ICD-10-CM

## 2023-04-17 DIAGNOSIS — F1210 Cannabis abuse, uncomplicated: Secondary | ICD-10-CM | POA: Diagnosis not present

## 2023-04-17 DIAGNOSIS — F331 Major depressive disorder, recurrent, moderate: Secondary | ICD-10-CM

## 2023-04-17 NOTE — Progress Notes (Signed)
THERAPIST PROGRESS NOTE  Session Time: 11:04am-11:59am  Participation Level: Active  Behavioral Response: Casual Alert Calm and Interactive  Type of Therapy: Individual Therapy  Treatment Goals addressed:  LTG: Daniel "CJ" will score less than 9 on the Patient Health Questionnaire (PHQ-9)   STG: Daniel "CJ" will identify cognitive patterns and beliefs that support depression LTG: Recall traumatic events without becoming overwhelmed with negative emotions  STG: Daniel "CJ" will practice conflict resolution skills at least 2-3 times per week for the next 26 weeks  LTG: Explore Stages of Change and ambivalence about stopping the use of alcohol and marijuana STG: Determine goals about the substances CH use  ProgressTowards Goals: Progressing  Interventions: Supportive  Summary: Daniel Marsh is a 42 y.o. male who presents with depression and PTSD for therapy.  He immediately expressed gratitude for [name] reaching out to get him schedule for therapy appointments, as he had not followed up after his CCA, is reluctant always to be the one to reach out.  He shared that his emotions recently have been "a roller coaster" because of the deaths of two close friends, one from cancer and one from uncontrolled diabetes.  His mother came here to celebrate her 70th birthday and they had a great time but he was infuriated that his father did not come and had no particular reason.  He expected his father to come because it is what he himself would have done.  [name] pointed out that "expectations are planned resentments" which he stated gave him something to consider.  He feels that his current medication regimen is the best he has been on in terms of how he feels.  He continues to struggle with sleep and averages about 4 hours nightly.  When he wakes up, he does not stay in bed but rather sits somewhere and tries to use the time to process things happening in his life, in his mind.    He  reported that he has recently reestablished contact with a Insurance underwriter.  They are able to talk with each other about the job traumas in a way nobody else understands, and in fact when he was talking with his buddy a few nights ago, his wife could not handle the conversation and left the room.  He was initially irritated but reasoned through why she did react in that way and came to an understanding quickly.  He shared that often after such a phone call he is scared to go to sleep.  He feels that he and his wife have a good system in placing of checking in with each other, especially on his mental wellbeing.  During this session, [name] reviewed the Cognitive-Behavioral model and patient was able to verbalize understanding.  [name] provided patient with a 1-page list of cognitive distortions and reviewed them with him, including what emotions might be generated from certain scenarios that demonstrate those thought traps.  He expressed understanding and gave many of the examples.  He showed no defensiveness, was able to recognize that he does each of these at some point.  The fact that he "should" not have to use a cane at the age of 41yo was provided as an example of a recent cognitive distortion that was worked through.  [name] explained the questions that can be helpful in determining if a thought is a cognitive distortion.  [name] also explained that we will continue to return to this again and again in order to effectuate change in feelings.  He  agreed that he can read more about them and try to became very familiar in between sessions.  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  He is seeking ways to make his pain and trauma mean something, does not wish to stay stuck in the past continuing to go through that pain.  He continues to have struggles with sleep and nightmares.    [name] gave patient the opportunity to explore thoughts and feelings associated with current life situations and past/present external stressors as desired.   [name] encouraged patient's expression of feelings and validated patient's thoughts, using empathy, active listening, open body language, and unconditional positive regard.  Patient demonstrated an orientation to time, place, person and situation.     Recommendations:  Return to therapy in 2 weeks, engage in self care behaviors, look at cognitive distortions handout given and become familiar with the various thought traps  Plan: Return again in 2 weeks. Next appointment:  5/30 at 11am  Diagnosis:  PTSD (post-traumatic stress disorder)  MDD (major depressive disorder), recurrent episode, moderate (HCC)  Cannabis use disorder, mild, abuse  Collaboration of Care: Psychiatrist AEB - read psychiatrist note; therapy notes are available to psychiatrist in Epic  Patient/Guardian was advised Release of Information must be obtained prior to any record release in order to collaborate their care with an outside provider. Patient/Guardian was advised if they have not already done so to contact the registration department to sign all necessary forms in order for [place] to release information regarding their care.   Consent: Patient/Guardian gives verbal consent for treatment and assignment of benefits for services provided during this visit. Patient/Guardian expressed understanding and agreed to proceed.   Lynnell Chad, LCSW 04/17/2023

## 2023-04-30 ENCOUNTER — Encounter (HOSPITAL_COMMUNITY): Payer: Self-pay | Admitting: Clinical

## 2023-04-30 ENCOUNTER — Ambulatory Visit (INDEPENDENT_AMBULATORY_CARE_PROVIDER_SITE_OTHER): Payer: BC Managed Care – PPO | Admitting: Clinical

## 2023-04-30 ENCOUNTER — Telehealth: Payer: Self-pay | Admitting: *Deleted

## 2023-04-30 DIAGNOSIS — F1010 Alcohol abuse, uncomplicated: Secondary | ICD-10-CM

## 2023-04-30 DIAGNOSIS — F4310 Post-traumatic stress disorder, unspecified: Secondary | ICD-10-CM | POA: Diagnosis not present

## 2023-04-30 DIAGNOSIS — F331 Major depressive disorder, recurrent, moderate: Secondary | ICD-10-CM

## 2023-04-30 DIAGNOSIS — F1210 Cannabis abuse, uncomplicated: Secondary | ICD-10-CM

## 2023-04-30 NOTE — Progress Notes (Signed)
  Care Coordination Note  04/30/2023 Name: Virender Grierson MRN: [id number] DOB: Dec 08, 1980  Daniel Marsh is a 42 y.o. year old male who is a primary care patient of Jarold Motto, [place] and is actively engaged with the care management team. I reached out to Daniel Kauffman by phone today to assist with re-scheduling a follow up visit with the RN Case Manager  Follow up plan: Telephone appointment with care management team member scheduled for: 05/05/2023  Burman Nieves, [facility] Care Coordination Care Guide Direct Dial: [phone]

## 2023-04-30 NOTE — Progress Notes (Signed)
  Care Coordination Note  04/30/2023 Name: Daniel Marsh MRN: [id number] DOB: 1980-12-22  Daniel Marsh is a 42 y.o. year old male who is a primary care patient of Jarold Motto, [place] and is actively engaged with the care management team. I reached out to Daniel Marsh by phone today to assist with re-scheduling a follow up visit with the RN Case Manager  Follow up plan: We have been unable to make contact with the patient for follow up.   Burman Nieves, CCMA Care Coordination Care Guide Direct Dial: [phone]

## 2023-04-30 NOTE — Progress Notes (Signed)
THERAPIST PROGRESS NOTE  Session Time: 11:05am-12:05pm  Participation Level: Active  Behavioral Response: Casual Alert Negative and Anxious  Type of Therapy: Individual Therapy  Treatment Goals addressed:  LTG: Daniel "CJ" will score less than 9 on the Patient Health Questionnaire (PHQ-9)   STG: Daniel "CJ" will identify cognitive patterns and beliefs that support depression LTG: Recall traumatic events without becoming overwhelmed with negative emotions  STG: Daniel "CJ" will practice conflict resolution skills at least 2-3 times per week for the next 26 weeks  LTG: Explore Stages of Change and ambivalence about stopping the use of alcohol and marijuana STG: Determine goals about the substances CH use  ProgressTowards Goals: Progressing  Interventions: Supportive, Anger Management Training, and Social Skills Training  Summary: Daniel Marsh is a 42 y.o. male who presents with depression and PTSD for therapy.  Patient reported that his mood has been "hit and miss."  He stated he has been extra angry recently and does not know the reasons.  He related several instances in which he abruptly became angry, all the while realizing that the specific trigger was not the immediate situation but rather things that have been building up over time.  While he was critical of something his wife did, he also displayed insight into the reasons she reacted in anger the way she did.  He stated, "She pokes the bear to see if it will roar."  In the midst of talking about this issue he has with his wife, he shared that his parents were educators and would not allow the patient or his siblings to show any lapse in proper grammar while talking.  In their early relationship, patient criticized his wife about her English and this has led her to be hypersensitive to his criticisms.  He has become a more docile person during the course of their marriage and his wife has never been exposed to  his [employer] or gang personalities, but she still fears that she will be, which disturbs him.    [name] discussed with the patient the concept of anger as a secondary emotion (Anger Iceberg used) as well as benefits of "I" statements and "Fair Fighting Rules."  Each of these was reviewed with some specificity.  He stated he is trying to become more accountable for his own actions and is willing to try these things.  He expressed the opinion about his wife that she is insecure and is therefore trying to avoid accountability.  [name] challenged this thinking to create a more open mind about listening to her about the reasons instead of trying to "mind read" or "magnify" his wife's words and actions.  Suicidal/Homicidal: No without intent/plan  Therapist Response: Patient is making progress AEB his willingness to look at possible changes he can make in his thinking and behaviors that will eventually effectuate changes in his feelings.  As a result this can help his relationship with himself and with his wife and son.   Recommendations:  Return to therapy in 2 weeks, engage in self care behaviors, continue to look at cognitive distortions handout given and become familiar with the various thought traps, study Anger Iceberg, consider Fair Fighting rules with spouse  Plan: Return again in 2 weeks.  Diagnosis:  PTSD (post-traumatic stress disorder)  MDD (major depressive disorder), recurrent episode, moderate (HCC)  Cannabis use disorder, mild, abuse  Mild alcohol use disorder  Tetrahydrocannabinol (THC) use disorder, mild, abuse  Collaboration of Care: Psychiatrist AEB - read psychiatrist note; therapy notes are  available to psychiatrist in Epic  Patient/Guardian was advised Release of Information must be obtained prior to any record release in order to collaborate their care with an outside provider. Patient/Guardian was advised if they have not already done so to contact the registration department to  sign all necessary forms in order for [place] to release information regarding their care.   Consent: Patient/Guardian gives verbal consent for treatment and assignment of benefits for services provided during this visit. Patient/Guardian expressed understanding and agreed to proceed.   Lynnell Chad, LCSW 04/30/2023

## 2023-05-05 ENCOUNTER — Ambulatory Visit: Payer: Self-pay

## 2023-05-05 NOTE — Patient Outreach (Signed)
  Care Coordination   Follow Up Visit Note   05/05/2023 Name: Daniel Marsh MRN: [id number] DOB: 24-Aug-1981  Daniel Marsh is a 42 y.o. year old male who sees [facility], [place], [place] for primary care. I spoke with  Daniel Marsh by phone today. Reports he is doing okay.  Now actively in therapy.  Hemorrhoid management continues.    What matters to the patients health and wellness today?  Managing health    Goals Addressed             This Visit's Progress    Managing Health       Patient Goals/Self Care Activities: -Patient/Caregiver will self-administer medications as prescribed as evidenced by self-report/primary caregiver report  -Patient/Caregiver will attend all scheduled provider appointments as evidenced by clinician review of documented attendance to scheduled appointments and patient/caregiver report -Patient/Caregiver will call provider office for new concerns or questions as evidenced by review of documented incoming telephone call notes and patient report  -Calls provider office for new concerns, questions, or BP outside discussed parameters -Checks BP and records as discussed -Follows a low sodium diet/DASH diet          SDOH assessments and interventions completed:  Yes  SDOH Interventions Today    Flowsheet Row Most Recent Value  SDOH Interventions   Utilities Interventions Intervention Not Indicated        Care Coordination Interventions:  Yes, provided   Follow up plan: Follow up call scheduled for August    Encounter Outcome:  Pt. Visit Completed   Bary Leriche, RN, MSN [facility] Care Management Care Management Coordinator Direct Line [phone]

## 2023-05-05 NOTE — Patient Instructions (Signed)
Visit Information  Thank you for taking time to visit with me today. Please don't hesitate to contact me if I can be of assistance to you.   Following are the goals we discussed today:   Goals Addressed             This Visit's Progress    Managing Health       Patient Goals/Self Care Activities: -Patient/Caregiver will self-administer medications as prescribed as evidenced by self-report/primary caregiver report  -Patient/Caregiver will attend all scheduled provider appointments as evidenced by clinician review of documented attendance to scheduled appointments and patient/caregiver report -Patient/Caregiver will call provider office for new concerns or questions as evidenced by review of documented incoming telephone call notes and patient report  -Calls provider office for new concerns, questions, or BP outside discussed parameters -Checks BP and records as discussed -Follows a low sodium diet/DASH diet          Our next appointment is by telephone on 07/06/23 at 1200 pm  Please call the care guide team at [phone] if you need to cancel or reschedule your appointment.   If you are experiencing a Mental Health or Behavioral Health Crisis or need someone to talk to, please call the Suicide and Crisis Lifeline: 988   Patient verbalizes understanding of instructions and care plan provided today and agrees to view in MyChart. Active MyChart status and patient understanding of how to access instructions and care plan via MyChart confirmed with patient.     The patient has been provided with contact information for the care management team and has been advised to call with any health related questions or concerns.   Bary Leriche, RN, MSN [facility] Care Management Care Management Coordinator Direct Line [phone]

## 2023-05-09 IMAGING — MR MR ABDOMEN WO/W CM
17 series · 48 of 48 positions shown · IV contrast (19ml multihance)
Comparison: 12/27/2020

CLINICAL DATA: Follow-up liver lesions.

EXAM:
MRI ABDOMEN WITHOUT AND WITH CONTRAST
TECHNIQUE: Multiplanar multisequence MR imaging of the abdomen was performed
both before and after the administration of intravenous contrast.
CONTRAST:  19mL MULTIHANCE GADOBENATE DIMEGLUMINE 529 MG/ML IV SOLN

[Series 4: T2 · coronal · 5.0mm · 1.56mm/px · 1 of 36 slices shown (1 of 3)]
[im 1/36]
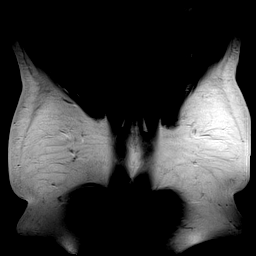

[Series 5: T1 · axial · 3.0mm · 1.19mm/px · z∈[-82,+131]mm · 5 of 144 slices shown]
[im 1/144]
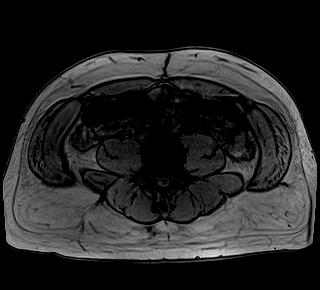
[im 36/144]
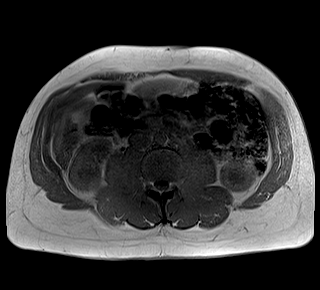
[im 72/144]
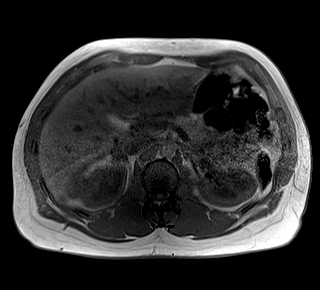
[im 108/144]
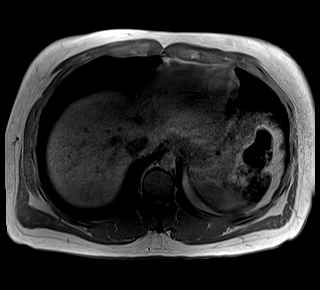
[im 144/144]
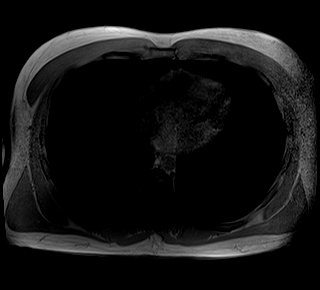

[Series 6: bSSFP · axial · 5.0mm · 1.25mm/px · z∈[-97,+125]mm · 2 of 38 slices shown]
[im 1/38]
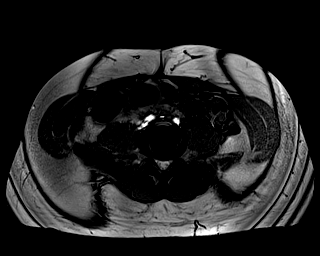
[im 38/38]
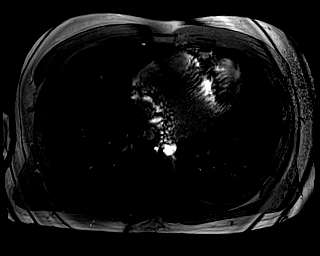

[Series 7: T2 · axial · 5.0mm · 1.48mm/px · z∈[-84,+138]mm · 2 of 38 slices shown (2 of 3)]
[im 1/38]
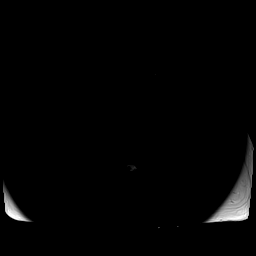
[im 38/38]
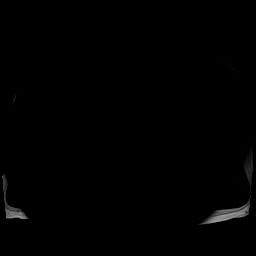

[Series 8: DWI · axial · 5.0mm · 1.42mm/px · z∈[-78,+132]mm · 5 of 108 slices shown (1 of 2)]
[im 1/108]
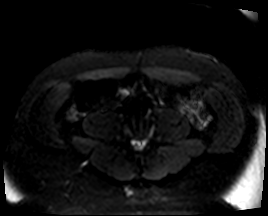
[im 27/108]
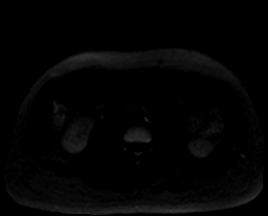
[im 54/108]
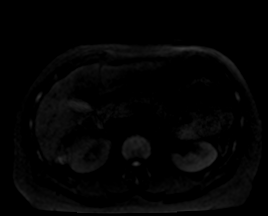
[im 81/108]
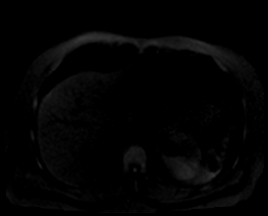
[im 108/108]
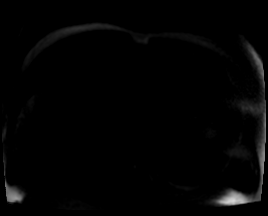

[Series 9: DWI · axial · 5.0mm · 1.42mm/px · z∈[-78,+132]mm · 2 of 36 slices shown (2 of 2)]
[im 1/36]
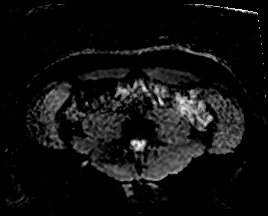
[im 36/36]
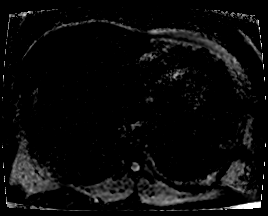

[Series 10: T2 · axial · 6.0mm · 1.19mm/px · 1 of 30 slices shown (3 of 3)]
[im 1/30]
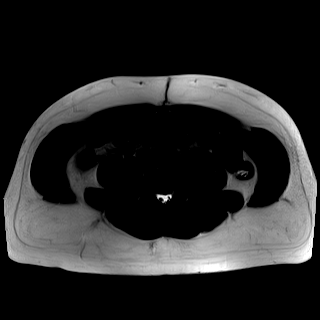

[Series 11: T1 dynamic · axial · non-contrast · 3.0mm · 1.25mm/px · z∈[-79,+134]mm · 3 of 72 slices shown]
[im 1/72]
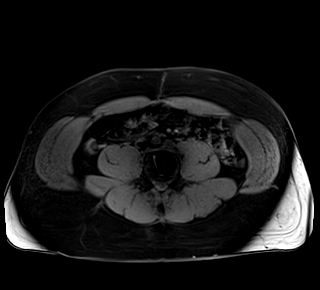
[im 36/72]
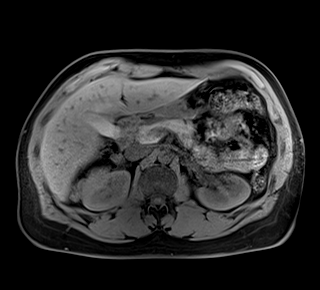
[im 72/72]
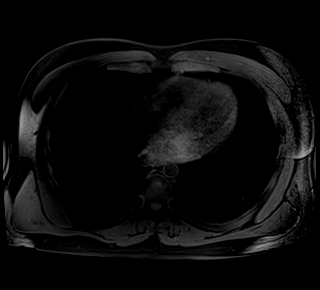

[Series 12: T1 dynamic post-contrast · axial · 3.0mm · 1.25mm/px · z∈[-79,+134]mm · 3 of 72 slices shown (1 of 9)]
[im 1/72]
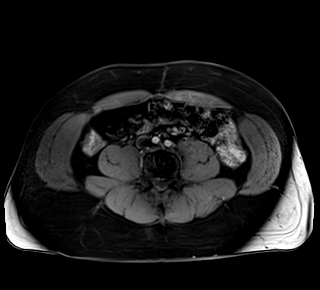
[im 36/72]
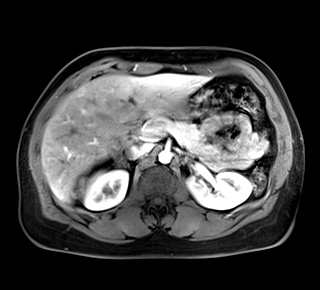
[im 72/72]
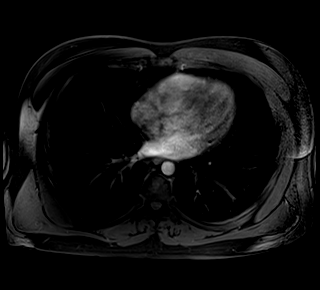

[Series 13: T1 dynamic post-contrast · axial · 3.0mm · 1.25mm/px · z∈[-79,+134]mm · 3 of 72 slices shown (2 of 9)]
[im 1/72]
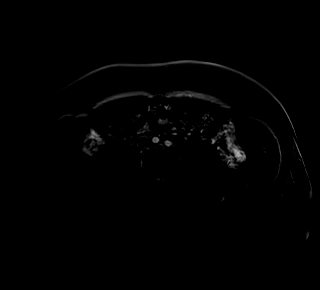
[im 36/72]
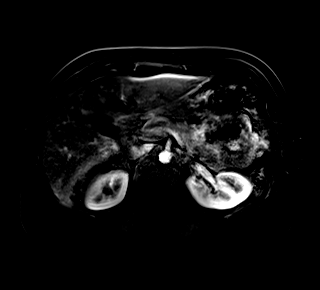
[im 72/72]
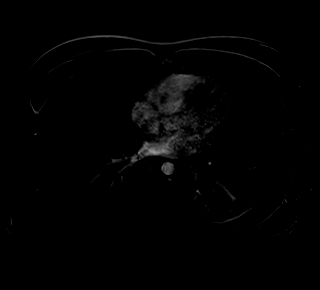

[Series 14: T1 dynamic post-contrast · axial · 3.0mm · 1.25mm/px · z∈[-79,+134]mm · 3 of 72 slices shown (3 of 9)]
[im 1/72]
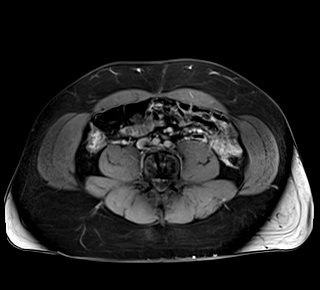
[im 36/72]
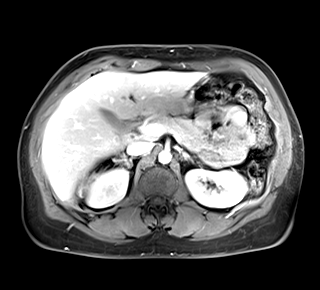
[im 72/72]
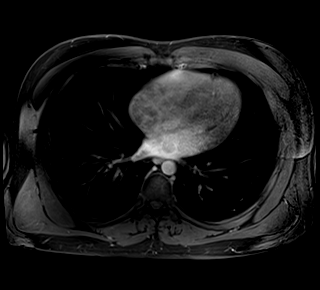

[Series 15: T1 dynamic post-contrast · axial · 3.0mm · 1.25mm/px · z∈[-79,+134]mm · 3 of 72 slices shown (4 of 9)]
[im 1/72]
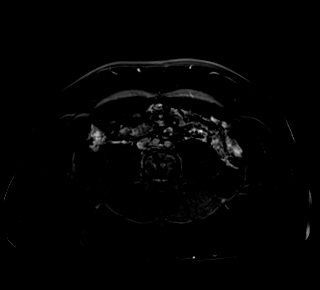
[im 36/72]
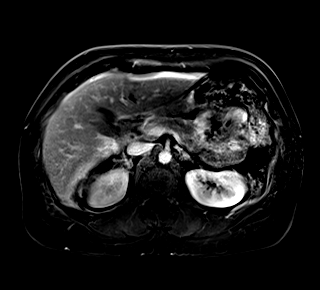
[im 72/72]
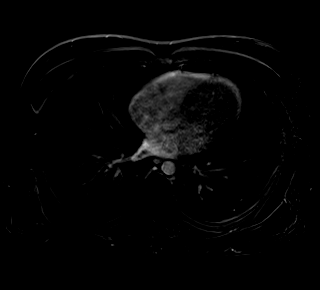

[Series 16: T1 dynamic post-contrast · axial · 3.0mm · 1.25mm/px · z∈[-79,+134]mm · 3 of 72 slices shown (5 of 9)]
[im 1/72]
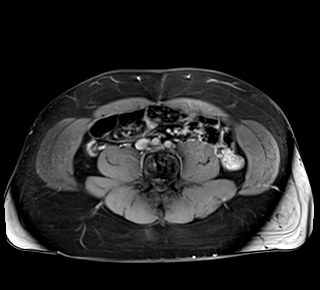
[im 36/72]
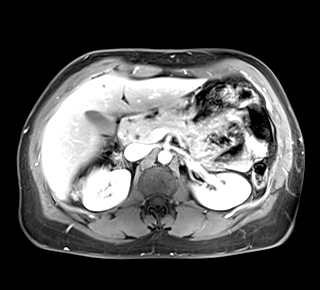
[im 72/72]
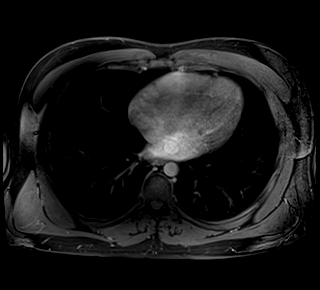

[Series 17: T1 dynamic post-contrast · axial · 3.0mm · 1.25mm/px · z∈[-79,+134]mm · 3 of 72 slices shown (6 of 9)]
[im 1/72]
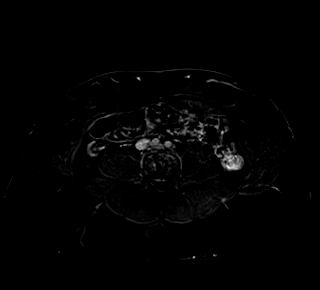
[im 36/72]
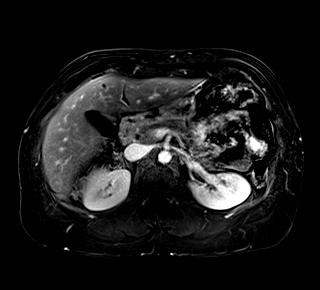
[im 72/72]
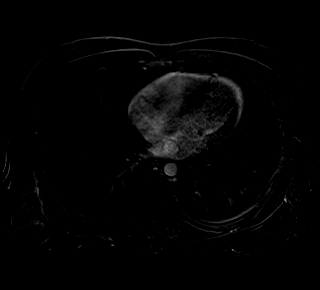

[Series 18: T1 dynamic post-contrast · coronal · 3.0mm · 1.25mm/px · 3 of 72 slices shown (7 of 9)]
[im 1/72]
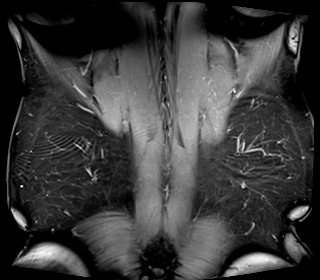
[im 36/72]
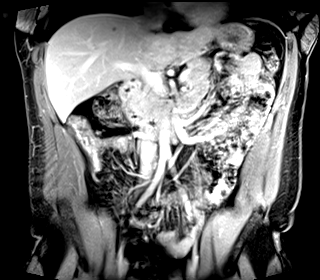
[im 72/72]
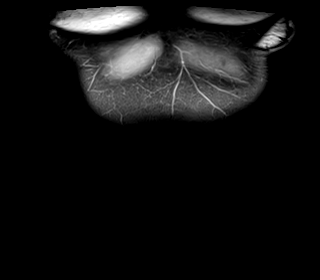

[Series 19: T1 dynamic post-contrast · axial · 3.0mm · 1.25mm/px · z∈[-79,+134]mm · 3 of 72 slices shown (8 of 9)]
[im 1/72]
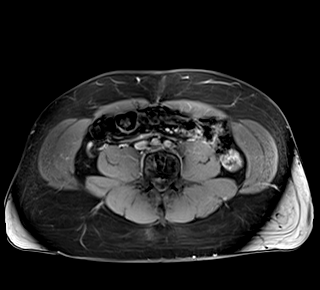
[im 36/72]
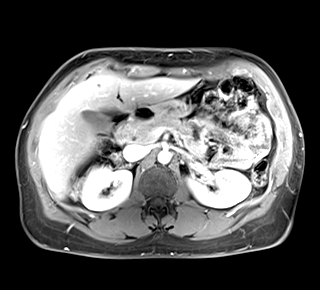
[im 72/72]
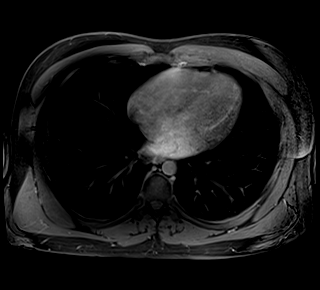

[Series 20: T1 dynamic post-contrast · axial · 3.0mm · 1.25mm/px · z∈[-79,+134]mm · 3 of 72 slices shown (9 of 9)]
[im 1/72]
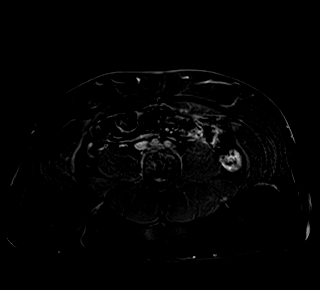
[im 36/72]
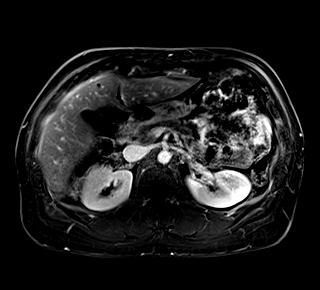
[im 72/72]
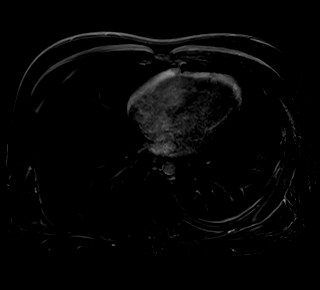

[48 of 48 positions shown; findings below may reference images not displayed]

FINDINGS: Lower chest: No acute findings.

Hepatobiliary: A 1.5 cm benign hemangioma in the posterior right
hepatic lobe remains stable since previous study. Other tiny sub-cm
hepatic cysts are also unchanged. No new or enlarging liver lesions
are identified. Gallbladder is unremarkable. No evidence of biliary
ductal dilatation.

Pancreas:  No mass or inflammatory changes.

Spleen:  Within normal limits in size and appearance.

Adrenals/Urinary Tract: No masses identified. No evidence of
hydronephrosis.

Stomach/Bowel: Visualized portion unremarkable.

Vascular/Lymphatic: No pathologically enlarged lymph nodes
identified. No acute vascular findings.

Other:  None.

Musculoskeletal:  No suspicious bone lesions identified.
IMPRESSION: Stable small benign hepatic hemangioma and tiny cysts. No evidence
of malignancy or other acute findings.

## 2023-06-23 ENCOUNTER — Other Ambulatory Visit: Payer: Self-pay | Admitting: Physician Assistant

## 2023-06-23 ENCOUNTER — Encounter: Payer: Self-pay | Admitting: Physician Assistant

## 2023-06-23 ENCOUNTER — Ambulatory Visit: Payer: BC Managed Care – PPO | Admitting: Physician Assistant

## 2023-06-23 VITALS — BP 140/90 | HR 79 | Temp 97.7°F | Ht 72.0 in | Wt 228.2 lb

## 2023-06-23 DIAGNOSIS — M961 Postlaminectomy syndrome, not elsewhere classified: Secondary | ICD-10-CM

## 2023-06-23 DIAGNOSIS — G8929 Other chronic pain: Secondary | ICD-10-CM

## 2023-06-23 DIAGNOSIS — Z0000 Encounter for general adult medical examination without abnormal findings: Secondary | ICD-10-CM

## 2023-06-23 DIAGNOSIS — K51911 Ulcerative colitis, unspecified with rectal bleeding: Secondary | ICD-10-CM

## 2023-06-23 DIAGNOSIS — E559 Vitamin D deficiency, unspecified: Secondary | ICD-10-CM

## 2023-06-23 DIAGNOSIS — E538 Deficiency of other specified B group vitamins: Secondary | ICD-10-CM | POA: Diagnosis not present

## 2023-06-23 DIAGNOSIS — E782 Mixed hyperlipidemia: Secondary | ICD-10-CM | POA: Diagnosis not present

## 2023-06-23 DIAGNOSIS — I10 Essential (primary) hypertension: Secondary | ICD-10-CM

## 2023-06-23 DIAGNOSIS — Z720 Tobacco use: Secondary | ICD-10-CM

## 2023-06-23 DIAGNOSIS — F4310 Post-traumatic stress disorder, unspecified: Secondary | ICD-10-CM

## 2023-06-23 DIAGNOSIS — F331 Major depressive disorder, recurrent, moderate: Secondary | ICD-10-CM | POA: Diagnosis not present

## 2023-06-23 DIAGNOSIS — F419 Anxiety disorder, unspecified: Secondary | ICD-10-CM

## 2023-06-23 LAB — CBC WITH DIFFERENTIAL/PLATELET
Basophils Absolute: 0.1 10*3/uL (ref 0.0–0.1)
Basophils Relative: 0.9 % (ref 0.0–3.0)
Eosinophils Absolute: 0 10*3/uL (ref 0.0–0.7)
Eosinophils Relative: 0.2 % (ref 0.0–5.0)
HCT: 36.8 % — ABNORMAL LOW (ref 39.0–52.0)
Hemoglobin: 11.5 g/dL — ABNORMAL LOW (ref 13.0–17.0)
Lymphocytes Relative: 10 % — ABNORMAL LOW (ref 12.0–46.0)
Lymphs Abs: 1 10*3/uL (ref 0.7–4.0)
MCHC: 31.3 g/dL (ref 30.0–36.0)
MCV: 85.4 fl (ref 78.0–100.0)
Monocytes Absolute: 1.1 10*3/uL — ABNORMAL HIGH (ref 0.1–1.0)
Monocytes Relative: 11.1 % (ref 3.0–12.0)
Neutro Abs: 7.8 10*3/uL — ABNORMAL HIGH (ref 1.4–7.7)
Neutrophils Relative %: 77.8 % — ABNORMAL HIGH (ref 43.0–77.0)
Platelets: 413 10*3/uL — ABNORMAL HIGH (ref 150.0–400.0)
RBC: 4.31 Mil/uL (ref 4.22–5.81)
RDW: 15.4 % (ref 11.5–15.5)
WBC: 10 10*3/uL (ref 4.0–10.5)

## 2023-06-23 LAB — COMPREHENSIVE METABOLIC PANEL
ALT: 12 U/L (ref 0–53)
AST: 16 U/L (ref 0–37)
Albumin: 4.7 g/dL (ref 3.5–5.2)
Alkaline Phosphatase: 49 U/L (ref 39–117)
BUN: 13 mg/dL (ref 6–23)
CO2: 28 mEq/L (ref 19–32)
Calcium: 9.4 mg/dL (ref 8.4–10.5)
Chloride: 104 mEq/L (ref 96–112)
Creatinine, Ser: 0.94 mg/dL (ref 0.40–1.50)
GFR: 102.06 mL/min (ref >60.00)
Glucose, Bld: 88 mg/dL (ref 70–99)
Potassium: 4.2 mEq/L (ref 3.5–5.1)
Sodium: 139 mEq/L (ref 135–145)
Total Bilirubin: 0.6 mg/dL (ref 0.2–1.2)
Total Protein: 7.2 g/dL (ref 6.0–8.3)

## 2023-06-23 LAB — IBC + FERRITIN
Ferritin: 3.7 ng/mL — ABNORMAL LOW (ref 22.0–322.0)
Iron: 19 ug/dL — ABNORMAL LOW (ref 42–165)
Saturation Ratios: 3.5 % — ABNORMAL LOW (ref 20.0–50.0)
TIBC: 543.2 ug/dL — ABNORMAL HIGH (ref 250.0–450.0)
Transferrin: 388 mg/dL — ABNORMAL HIGH (ref 212.0–360.0)

## 2023-06-23 LAB — LIPID PANEL
Cholesterol: 227 mg/dL — ABNORMAL HIGH (ref 0–200)
HDL: 76.4 mg/dL (ref >39.00)
LDL Cholesterol: 116 mg/dL — ABNORMAL HIGH (ref 0–99)
NonHDL: 150.92
Total CHOL/HDL Ratio: 3
Triglycerides: 173 mg/dL — ABNORMAL HIGH (ref 0.0–149.0)
VLDL: 34.6 mg/dL (ref 0.0–40.0)

## 2023-06-23 LAB — VITAMIN B12: Vitamin B-12: 130 pg/mL — ABNORMAL LOW (ref 211–911)

## 2023-06-23 LAB — VITAMIN D 25 HYDROXY (VIT D DEFICIENCY, FRACTURES): VITD: 17.62 ng/mL — ABNORMAL LOW (ref 30.00–100.00)

## 2023-06-23 MED ORDER — PRAZOSIN HCL 2 MG PO CAPS: 4.0000 mg | ORAL_CAPSULE | Freq: Every day | ORAL | 1 refills | Status: DC

## 2023-06-23 MED ORDER — VITAMIN D (ERGOCALCIFEROL) 1.25 MG (50000 UNIT) PO CAPS: 50000.0000 [IU] | ORAL_CAPSULE | ORAL | 0 refills | Status: DC

## 2023-06-23 MED ORDER — VORTIOXETINE HBR 20 MG PO TABS: 20.0000 mg | ORAL_TABLET | Freq: Every day | ORAL | 3 refills | Status: DC

## 2023-06-23 NOTE — Addendum Note (Signed)
Addended by: Haynes Bast on: 06/23/2023 02:04 PM   Modules accepted: Orders

## 2023-06-23 NOTE — Patient Instructions (Addendum)
It was great to see you!  Will place referral to [facility] today  Please go to the lab for blood work.   Our office will call you with your results unless you have chosen to receive results via MyChart.  If your blood work is normal we will follow-up each year for physicals and as scheduled for chronic medical problems.  If anything is abnormal we will treat accordingly and get you in for a follow-up.  Take care,  Lelon Mast

## 2023-06-23 NOTE — Progress Notes (Addendum)
Subjective:    Daniel Marsh is a 42 y.o. male and is here for a comprehensive physical exam.  HPI  There are no preventive care reminders to display for this patient.  Acute Concerns: Hematemesis He reports having an multiple episodes of vomiting blood and passing blood in his stools last week.  His symptoms have resolved at this time.  He has a regular follow up with his GI specialist soon and is planning on bringing up his symptoms to them.   B12 deficiency He complains of feeling more lethargic than normal.  He reports his symptoms were better while taking vitamin B12 injections.   Chronic Issues: Failed back syndrome He received a spinal epidural a couple months ago and reports his pain is returning faster than his previous injections.  He reports his spinal hardware has shifted during his last back procedure.  His other provider is recommending patient see a surgeon to correct it.   Anxiety; Depression; PTSD He ran out of 20 mg Trintellix daily PO and is interested in resuming it.  He continues taking 150 mg amitriptyline daily PO and reports no new issues wihle taking it.  Seeing psychiatry and psychology Denies current SI/HI plan  Bilateral knee pain Hospital of needing injections in his knee; has done well with Dewaine Conger in the past Would like to return to their office to discuss pursuit of gel injections  Smoking:  He continues smoking cigarettes and is not interested in stopping at this time.   HLD He continues taking 20 mg Lipitor daily PO and reports no new issues while taking them.  Lab Results  Component Value Date   CHOL 198 06/09/2022   HDL 76.80 06/09/2022   LDLCALC 99 06/09/2022   TRIG 111.0 06/09/2022   CHOLHDL 3 06/09/2022   Family history:  Both his parents are alive.  His father currently has bladder cancer.  Both his brothers are alive and healthy at this time.   Health Maintenance: Immunizations -- N/A Colonoscopy -- Last  completed 07/17/2020.  Results showed: - Hemorrhoids found on perianal exam. - One 4 mm polyp in the sigmoid colon, removed with a cold snare. Resected and retrieved. - Scattered mild inflammation was found in the sigmoid colon. Biopsied. - Diverticulosis in the sigmoid colon. - Normal mucosa in the rectum, in the recto- sigmoid colon, in the descending colon, in the transverse colon, in the ascending colon and in the cecum. Biopsied. - Non- bleeding internal hemorrhoids. PSA -- to be completed next year Diet -- He is eating once daily.  He has no joy while eating and only does it for sustenance.  Sleep habits -- He is sleeping at least 4 hours every night with uninterrupted sleep.  Exercise -- N/A  Weight -- @FLOWAMB (14)@  Recent weight history Wt Readings from Last 10 Encounters:  06/23/23 228 lb 4 oz (103.5 kg)  04/07/23 239 lb (108.4 kg)  03/10/23 239 lb (108.4 kg)  02/16/23 237 lb (107.5 kg)  02/06/23 235 lb (106.6 kg)  01/07/23 217 lb (98.4 kg)  10/08/22 211 lb (95.7 kg)  08/06/22 221 lb 6.4 oz (100.4 kg)  07/24/22 220 lb (99.8 kg)  07/09/22 218 lb (98.9 kg)   Body mass index is 30.96 kg/m.  Mood -- He continues following up with his psychiatrist regularly and reports improvement with his mental health.  He is now seeing a psychologist and reports doing well while seeing them.  Alcohol use --  reports current alcohol use of  about 12.0 standard drinks of alcohol per week.  Tobacco use --  Tobacco Use: High Risk (06/23/2023)   Patient History    Smoking Tobacco Use: Every Day    Smokeless Tobacco Use: Current    Passive Exposure: Not on file    Eligible for Low Dose CT? no  UTD with eye doctor? Yes UTD with dentist? No. Planning on scheduling an appointment.      06/23/2023    1:03 PM  Depression screen PHQ 2/9  Decreased Interest 2  Down, Depressed, Hopeless 2  PHQ - 2 Score 4  Altered sleeping 2  Tired, decreased energy 2  Change in appetite 2  Feeling bad or  failure about yourself  2  Trouble concentrating 2  Moving slowly or fidgety/restless 1  Suicidal thoughts 1  PHQ-9 Score 16  Difficult doing work/chores Somewhat difficult    Other providers/specialists: Patient Care Team: Jarold Motto, [place] as PCP - General (Physician Assistant) Fleeta Emmer, RN as [facility] Care Management    PMHx, SurgHx, SocialHx, Medications, and Allergies were reviewed in the Visit Navigator and updated as appropriate.   Past Medical History:  Diagnosis Date   Anemia    Anxiety    Arthritis    Depression    Hypertension    Liver disease    Myocardial infarction [facility])    PTSD (post-traumatic stress disorder)    Ulcerative colitis (HCC) 2019     Past Surgical History:  Procedure Laterality Date   ANKLE SURGERY Right 2011   KNEE SURGERY Left 2020   SPINE SURGERY  2013   Lumbar fusion L4-S1   WISDOM TOOTH EXTRACTION       Family History  Problem Relation Age of Onset   Osteoarthritis Mother    Depression Mother    Heart attack Mother 18   Osteoarthritis Father    Depression Father    Bladder Cancer Father        in remission   Prostate cancer Father    Depression Brother    Depression Brother    Diabetes Maternal Grandmother    Hyperlipidemia Maternal Grandmother    Hypertension Maternal Grandmother    Heart attack Maternal Grandmother    Osteoarthritis Maternal Grandfather    COPD Maternal Grandfather    Kidney disease Maternal Grandfather    Renal cancer Maternal Grandfather    Alcohol abuse Paternal Grandmother    COPD Paternal Grandmother    Diabetes Paternal Grandmother    Hypertension Paternal Grandmother    Lung cancer Paternal Grandmother    Osteoarthritis Paternal Grandfather    Heart attack Paternal Grandfather    Heart failure Paternal Grandfather    Pancreatic cancer Maternal Aunt    Colon cancer Maternal Uncle    Lung cancer Paternal Aunt     Social History   Tobacco Use   Smoking status:  Every Day    Current packs/day: 0.25    Average packs/day: 0.3 packs/day for 15.0 years (3.8 ttl pk-yrs)    Types: Cigarettes, Cigars   Smokeless tobacco: Current    Types: Chew   Tobacco comments:    I am usually a social smoker and he "dips" occ  Vaping Use   Vaping status: Never Used  Substance Use Topics   Alcohol use: Yes    Alcohol/week: 12.0 standard drinks of alcohol    Types: 12 Cans of beer per week   Drug use: Yes    Types: Marijuana    Comment: Was using to  self medicate for depression and anxiety    Review of Systems:   Review of Systems  Constitutional:  Positive for malaise/fatigue. Negative for chills, fever and weight loss.  HENT:  Negative for hearing loss, sinus pain and sore throat.   Respiratory:  Negative for cough and hemoptysis.   Cardiovascular:  Negative for chest pain, palpitations, leg swelling and PND.  Gastrointestinal:  Negative for abdominal pain, constipation, diarrhea, heartburn, nausea and vomiting.  Genitourinary:  Negative for dysuria, frequency and urgency.  Musculoskeletal:  Negative for back pain, myalgias and neck pain.  Skin:  Negative for itching and rash.  Neurological:  Negative for dizziness, tingling, seizures and headaches.  Endo/Heme/Allergies:  Negative for polydipsia.  Psychiatric/Behavioral:  Negative for depression. The patient is not nervous/anxious.     Objective:    Vitals:   06/23/23 1304 06/23/23 1329  BP: (!) 140/90 (!) 140/90  Pulse: 79   Temp: 97.7 F (36.5 C)   SpO2: 98%     Body mass index is 30.96 kg/m.  General  Alert, cooperative, no distress, appears stated age  Head:  Normocephalic, without obvious abnormality, atraumatic  Eyes:  PERRL, conjunctiva/corneas clear, EOM's intact, fundi benign, both eyes       Ears:  Normal TM's and external ear canals, both ears  Nose: Nares normal, septum midline, mucosa normal, no drainage or sinus tenderness  Throat: Lips, mucosa, and tongue normal; teeth and  gums normal  Neck: Supple, symmetrical, trachea midline, no adenopathy;     thyroid:  No enlargement/tenderness/nodules; no carotid bruit or JVD  Back:   Symmetric, no curvature, ROM normal, no CVA tenderness  Lungs:   Clear to auscultation bilaterally, respirations unlabored  Chest wall:  No tenderness or deformity  Heart:  Regular rate and rhythm, S1 and S2 normal, no murmur, rub or gallop  Abdomen:   Soft, non-tender, bowel sounds active all four quadrants, no masses, no organomegaly  Extremities: Extremities normal, atraumatic, no cyanosis or edema  Prostate : Deferred   Skin: Skin color, texture, turgor normal, no rashes or lesions  Lymph nodes: Cervical, supraclavicular, and axillary nodes normal  Neurologic: CNII-XII grossly intact. Normal strength, sensation and reflexes throughout   AssessmentPlan:   Routine physical examination Today patient counseled on age appropriate routine health concerns for screening and prevention, each reviewed and up to date or declined. Immunizations reviewed and up to date or declined. Labs ordered and reviewed. Risk factors for depression reviewed and negative. Hearing function and visual acuity are intact. ADLs screened and addressed as needed. Functional ability and level of safety reviewed and appropriate. Education, counseling and referrals performed based on assessed risks today. Patient provided with a copy of personalized plan for preventive services.  Vitamin B12 deficiency Update B12 and provided recommendations Low threshold to re-start B12 injections  Moderate episode of recurrent major depressive disorder (HCC); Anxiety; PTSD (post-traumatic stress disorder) Overall stable Connecting well with psychiatry and psychology We will increase his prazosin to 4 mg daily Refill trintellix 20 mg I discussed with patient that if they develop any SI, to tell someone immediately and seek medical attention. Further management per psychiatry  Mixed  hyperlipidemia Update lipid panel and adjust Lipitor 20 mg daily as indicated  Primary hypertension Above goal today No evidence of end-organ damage on my exam Suspect elevated due to pain Recommend patient monitor home blood pressure at least a few times weekly If home monitoring shows consistent elevation, or any symptom(s) develop, recommend reach out to [place]  for further advice on next steps  Tobacco abuse He is not ready to reduce intake at this time  Ulcerative colitis with rectal bleeding, unspecified location [facility]) Management per gastro Recommend close follow-up with them to discuss hematemesis  Vitamin D deficiency Update vitamin D and provide recommendations accordingly  Bilateral knee pain Per patient request, refer to Dewaine Conger for further evaluation  Failed back syndrome Ongoing Management per specialists   I,Shehryar Baig,acting as a scribe for Jarold Motto, PA.,have documented all relevant documentation on the behalf of Jarold Motto, PA,as directed by  Jarold Motto, PA while in the presence of Jarold Motto, [place].  I, Jarold Motto, [place], have reviewed all documentation for this visit. The documentation on 06/23/23 for the exam, diagnosis, procedures, and orders are all accurate and complete.  Jarold Motto, PA-C [facility]

## 2023-06-25 ENCOUNTER — Telehealth: Payer: Self-pay

## 2023-06-25 ENCOUNTER — Other Ambulatory Visit (HOSPITAL_COMMUNITY): Payer: Self-pay

## 2023-06-25 NOTE — Telephone Encounter (Signed)
Pharmacy Patient Advocate Encounter  Received notification from  [facility]  that Prior Authorization for Trintellix 20mg  has been APPROVED from 06/25/23 to 06/24/24. Ran test claim, Copay is $190 for 90 days and $95 for 30 days.  PA #/Case ID/Reference #: [id number]  Placed a call to [facility] and left a voicemail to notify of the approval.

## 2023-06-25 NOTE — Telephone Encounter (Signed)
Pharmacy Patient Advocate Encounter   Received notification from CoverMyMeds that prior authorization for Trintellix 20mg  is required/requested.   Insurance verification completed.   The patient is insured through  [employer]  .   Per test claim: PA required; PA submitted to [employer] [facility] via CoverMyMeds Key/confirmation #/EOC [id number] Status is pending

## 2023-06-26 ENCOUNTER — Other Ambulatory Visit: Payer: Self-pay | Admitting: Physician Assistant

## 2023-06-26 MED ORDER — ATORVASTATIN CALCIUM 20 MG PO TABS: 20.0000 mg | ORAL_TABLET | Freq: Every day | ORAL | 0 refills | Status: DC

## 2023-06-29 ENCOUNTER — Telehealth: Payer: Self-pay | Admitting: Registered Nurse

## 2023-06-29 NOTE — Telephone Encounter (Signed)
Call placed to Daniel Marsh, no answer. : Regarding his Pregabalin Left message to return the call.

## 2023-07-06 ENCOUNTER — Ambulatory Visit: Payer: Self-pay

## 2023-07-06 NOTE — Patient Outreach (Signed)
  Care Coordination   07/06/2023 Name: Daniel Marsh MRN: [id number] DOB: 12-15-1980   Care Coordination Outreach Attempts:  An unsuccessful telephone outreach was attempted today to offer the patient information about available care coordination services.  Follow Up Plan:  Additional outreach attempts will be made to offer the patient care coordination information and services.   Encounter Outcome:  No Answer   Care Coordination Interventions:  No, not indicated    Bary Leriche, RN, MSN [facility] Care Management Care Management Coordinator Direct Line [phone]

## 2023-07-07 ENCOUNTER — Telehealth (HOSPITAL_COMMUNITY): Payer: BC Managed Care – PPO | Admitting: Psychiatry

## 2023-07-15 ENCOUNTER — Telehealth: Payer: Self-pay

## 2023-07-15 NOTE — Patient Outreach (Signed)
  Care Coordination   07/15/2023 Name: Conley Deman MRN: [id number] DOB: Sep 13, 1981   Care Coordination Outreach Attempts:  A second unsuccessful outreach was attempted today to offer the patient with information about available care coordination services.  Follow Up Plan:  Additional outreach attempts will be made to offer the patient care coordination information and services.   Encounter Outcome:  No Answer   Care Coordination Interventions:  No, not indicated    Bary Leriche, RN, MSN [facility] Care Management Care Management Coordinator Direct Line [phone]

## 2023-07-20 ENCOUNTER — Telehealth: Payer: Self-pay

## 2023-07-20 NOTE — Patient Outreach (Signed)
  Care Coordination   07/20/2023 Name: Daniel Marsh MRN: [id number] DOB: 1981/09/27   Care Coordination Outreach Attempts:  A third unsuccessful outreach was attempted today to offer the patient with information about available care coordination services.  Follow Up Plan:  No further outreach attempts will be made at this time. We have been unable to contact the patient to offer or enroll patient in care coordination services  Encounter Outcome:  No Answer   Care Coordination Interventions:  No, not indicated    Bary Leriche, RN, MSN [facility] Care Management Care Management Coordinator Direct Line [phone]

## 2023-08-04 ENCOUNTER — Encounter: Payer: BC Managed Care – PPO | Attending: Registered Nurse | Admitting: Registered Nurse

## 2023-08-04 ENCOUNTER — Encounter: Payer: Self-pay | Admitting: Registered Nurse

## 2023-08-04 VITALS — BP 142/81 | HR 77 | Ht 72.0 in | Wt 226.4 lb

## 2023-08-04 DIAGNOSIS — G8929 Other chronic pain: Secondary | ICD-10-CM | POA: Diagnosis not present

## 2023-08-04 DIAGNOSIS — R269 Unspecified abnormalities of gait and mobility: Secondary | ICD-10-CM

## 2023-08-04 DIAGNOSIS — M545 Low back pain, unspecified: Secondary | ICD-10-CM | POA: Diagnosis not present

## 2023-08-04 DIAGNOSIS — M5416 Radiculopathy, lumbar region: Secondary | ICD-10-CM | POA: Diagnosis not present

## 2023-08-04 DIAGNOSIS — M25561 Pain in right knee: Secondary | ICD-10-CM | POA: Insufficient documentation

## 2023-08-04 DIAGNOSIS — M961 Postlaminectomy syndrome, not elsewhere classified: Secondary | ICD-10-CM | POA: Diagnosis not present

## 2023-08-04 DIAGNOSIS — M47816 Spondylosis without myelopathy or radiculopathy, lumbar region: Secondary | ICD-10-CM | POA: Diagnosis not present

## 2023-08-04 DIAGNOSIS — M25562 Pain in left knee: Secondary | ICD-10-CM | POA: Insufficient documentation

## 2023-08-04 MED ORDER — PREGABALIN 75 MG PO CAPS: 75.0000 mg | ORAL_CAPSULE | Freq: Three times a day (TID) | ORAL | 2 refills | Status: AC

## 2023-08-04 NOTE — Progress Notes (Signed)
Subjective:    Patient ID: Daniel Marsh, male    DOB: 24-Jan-1981, 42 y.o.   MRN: [id number]  HPI: [name] is a 42 y.o. male who returns for follow up appointment for chronic pain and medication refill. He also reports increased intensity of  his lower back radiating into his lower extremities. His last ESI was 03/10/2023 , with good relief noted 80%, hie noticed an increased in his pain in August, he will be scheduled with Dr Wynn Banker for : see below, this was discussed with Dr Wynn Banker and he agrees with plan.  Left S1 sacral transforaminal epidural steroid injection under fluoroscopic guidance with contrast enhancement    He verbalizes understanding. [name]   He rates his pain 9. His current exercise regime is walking short distances .    Pain Inventory Average Pain 8 Pain Right Now 9 My pain is constant, sharp, burning, dull, stabbing, tingling, and aching  In the last 24 hours, has pain interfered with the following? General activity 8 Relation with others 8 Enjoyment of life 8 What TIME of day is your pain at its worst? morning , daytime, evening, and night Sleep (in general) Poor  Pain is worse with: walking, bending, sitting, inactivity, standing, and some activites Pain improves with: rest, heat/ice, TENS, and injections Relief from Meds: 0  Family History  Problem Relation Age of Onset   Osteoarthritis Mother    Depression Mother    Heart attack Mother 4   Osteoarthritis Father    Depression Father    Bladder Cancer Father        in remission   Prostate cancer Father    Depression Brother    Depression Brother    Diabetes Maternal Grandmother    Hyperlipidemia Maternal Grandmother    Hypertension Maternal Grandmother    Heart attack Maternal Grandmother    Osteoarthritis Maternal Grandfather    COPD Maternal Grandfather    Kidney disease Maternal Grandfather    Renal cancer Maternal Grandfather    Alcohol abuse Paternal Grandmother     COPD Paternal Grandmother    Diabetes Paternal Grandmother    Hypertension Paternal Grandmother    Lung cancer Paternal Grandmother    Osteoarthritis Paternal Grandfather    Heart attack Paternal Grandfather    Heart failure Paternal Grandfather    Pancreatic cancer Maternal Aunt    Colon cancer Maternal Uncle    Lung cancer Paternal Aunt    Social History   Socioeconomic History   Marital status: Married    Spouse name: Not on file   Number of children: 3   Years of education: Not on file   Highest education level: Not on file  Occupational History   Occupation: Disabled  Tobacco Use   Smoking status: Every Day    Current packs/day: 0.25    Average packs/day: 0.3 packs/day for 15.0 years (3.8 ttl pk-yrs)    Types: Cigarettes, Cigars   Smokeless tobacco: Current    Types: Chew   Tobacco comments:    I am usually a social smoker and he "dips" occ  Vaping Use   Vaping status: Never Used  Substance and Sexual Activity   Alcohol use: Yes    Alcohol/week: 12.0 standard drinks of alcohol    Types: 12 Cans of beer per week   Drug use: Yes    Types: Marijuana    Comment: Was using to self medicate for depression and anxiety   Sexual activity: Yes    Partners: Female  Birth control/protection: I.U.D.    Comment: My wife currently has the [place] IUD  Other Topics Concern   Not on file  Social History Narrative   ** Merged History Encounter **       Was in the [employer] for 7 years, required lumbar surgery while in the [employer] and had poor outcome Divorced and re-married 2 yo son    Social Determinants of Corporate investment banker Strain: Not on file  Food Insecurity: No Food Insecurity (01/07/2023)   Hunger Vital Sign    Worried About Running Out of Food in the Last Year: Never true    Ran Out of Food in the Last Year: Never true  Transportation Needs: No Transportation Needs (02/02/2023)   PRAPARE - Administrator, Civil Service (Medical): No    Lack  of Transportation (Non-Medical): No  Physical Activity: Not on file  Stress: Not on file  Social Connections: Not on file   Past Surgical History:  Procedure Laterality Date   ANKLE SURGERY Right 2011   KNEE SURGERY Left 2020   SPINE SURGERY  2013   Lumbar fusion L4-S1   WISDOM TOOTH EXTRACTION     Past Surgical History:  Procedure Laterality Date   ANKLE SURGERY Right 2011   KNEE SURGERY Left 2020   SPINE SURGERY  2013   Lumbar fusion L4-S1   WISDOM TOOTH EXTRACTION     Past Medical History:  Diagnosis Date   Anemia    Anxiety    Arthritis    Depression    Hypertension    Liver disease    Myocardial infarction (HCC)    PTSD (post-traumatic stress disorder)    Ulcerative colitis (HCC) 2019   BP (!) 142/81   Pulse 77   Ht 6' (1.829 m)   Wt 226 lb 6.4 oz (102.7 kg)   SpO2 100%   BMI 30.71 kg/m   Opioid Risk Score:   Fall Risk Score:  `1  Depression screen PHQ 2/9     06/23/2023    1:03 PM 02/06/2023   10:00 AM 02/05/2023   12:23 PM 02/02/2023    1:05 PM 06/24/2022    2:55 PM 06/13/2022    9:57 AM 06/10/2022    3:03 PM  Depression screen PHQ 2/9  Decreased Interest 2 2  3  0 1 3  Down, Depressed, Hopeless 2 2  3  0 1 3  PHQ - 2 Score 4 4  6  0 2 6  Altered sleeping 2   3 0    Tired, decreased energy 2   3 0    Change in appetite 2   1 0    Feeling bad or failure about yourself  2   3 0    Trouble concentrating 2   3 0    Moving slowly or fidgety/restless 1   3 0    Suicidal thoughts 1   0 0    PHQ-9 Score 16   22 0    Difficult doing work/chores Somewhat difficult    Not difficult at all       Information is confidential and restricted. Go to Review Flowsheets to unlock data.      Review of Systems  Musculoskeletal:  Positive for back pain.       Bilateral leg pain  All other systems reviewed and are negative.     Objective:   Physical Exam Vitals and nursing note reviewed.  Constitutional:  Appearance: Normal appearance.  Cardiovascular:      Rate and Rhythm: Normal rate and regular rhythm.     Pulses: Normal pulses.     Heart sounds: Normal heart sounds.  Pulmonary:     Effort: Pulmonary effort is normal.     Breath sounds: Normal breath sounds.  Musculoskeletal:     Cervical back: Normal range of motion and neck supple.     Comments: Normal Muscle Bulk and Muscle Testing Reveals:  Upper Extremities: Full ROM and Muscle Strength 5/5  Lumbar Hypersensitivity Lower Extremities: Decreased ROM  and Muscle Strength 5/5 Bilateral Lower Extremities Flexion Produces Pain into her Lower Back and Bilateral Arises from chair slowly Antalgic Gait     Skin:    General: Skin is warm and dry.  Neurological:     Mental Status: He is alert and oriented to person, place, and time.  Psychiatric:        Mood and Affect: Mood normal.        Behavior: Behavior normal.         Assessment & Plan:  1.Failed Back Syndrome: Continue HEP as Tolerated. Continue current medication regimen. Continue to monitor. 08/04/2023 2.Facet Arthropathy, Lumbar: Continue HEP as Tolerated. Continue current medication regimen. Continue to monitor. 08/04/2023 3.Lumbar Radiculitis: Schedule: with Dr Wynn Banker: See below Lumbosacral transforaminal epidural steroid injection under fluoroscopic guidance with contrast enhancement   Indication: Lumbosacral radiculitis is not relieved by medication management or other conservative care and interfering with self-care and mobility.       Continue Pregabalin  Continue to Monitor. 08/04/2023 4. Abnormality of Gait: Continue using cane. Continue to monitor. 08/04/2023 5. Chronic Bilateral Knee pain: L>R: Orthopedist Following  Continue to monitor 08/04/2023 6.Chronic Left Hip Pain: No complaints today.  Continue to Monitor. 08/04/2023 7. Sleep Disturbance: Continue Elavil at HS. Continue to Monitor. 08/04/2023   F/U with Dr Wynn Banker

## 2023-08-12 ENCOUNTER — Telehealth (HOSPITAL_BASED_OUTPATIENT_CLINIC_OR_DEPARTMENT_OTHER): Payer: BC Managed Care – PPO | Admitting: Psychiatry

## 2023-08-12 ENCOUNTER — Encounter (HOSPITAL_COMMUNITY): Payer: Self-pay | Admitting: Psychiatry

## 2023-08-12 VITALS — Wt 226.0 lb

## 2023-08-12 DIAGNOSIS — F4310 Post-traumatic stress disorder, unspecified: Secondary | ICD-10-CM

## 2023-08-12 DIAGNOSIS — F331 Major depressive disorder, recurrent, moderate: Secondary | ICD-10-CM

## 2023-08-12 DIAGNOSIS — F1210 Cannabis abuse, uncomplicated: Secondary | ICD-10-CM | POA: Diagnosis not present

## 2023-08-12 MED ORDER — AMITRIPTYLINE HCL 150 MG PO TABS: 150.0000 mg | ORAL_TABLET | Freq: Every day | ORAL | 0 refills | Status: DC

## 2023-08-12 NOTE — Progress Notes (Signed)
[facility] MD Virtual Progress Note   Patient Location: Home Provider Location: Home Office  I connect with patient by video and verified that I am speaking with correct person by using two identifiers. I discussed the limitations of evaluation and management by telemedicine and the availability of in person appointments. I also discussed with the patient that there may be a patient responsible charge related to this service. The patient expressed understanding and agreed to proceed.  Daniel Marsh [id number] 42 y.o.  08/12/2023 11:26 AM  History of Present Illness:  Patient is evaluated by video session.  He is taking his medication as prescribed.  He feels his mood is stable.  He had a family reunion vacation in [place].  He was happy to see the [place] stadium however he also had a trigger that causes worsening of nightmares and flashback.  He reported things are much better and he is slowly and gradually going back to his baseline.  He admitted sometimes he goes to bed very late and then he need to wake up to drop his child to school.  Denies any anger, irritability, crying spells or any feeling of hopelessness or worthlessness.  His appetite is okay.  Denies any suicidal thoughts or homicidal thoughts.  He admitted continued to have some time moments when he gets frustrated and irritable and then he will go to his own isolation which usually helps him.  However these episodes are less intense and less frequent.  He has no concern from the medication.  He liked the amitriptyline which he takes at bedtime.  He admitted his marijuana smoking is unchanged from the past.  He is pleased that is not increased and he is trying to cut down slowly and gradually.  He is in therapy with Ms. Lucretia Field.  He has appointment coming up end of this month.  He had blood work recently and found to have low vitamin, low iron, and anemia and increased cholesterol.  Patient told he was  given the diagnosis of ulcerative colitis and now he is seeing GI to address these issues.  He is taking vitamin D.  He is getting Trintellix, Valium, and Minipress from his primary care.  Like to continue amitriptyline from our office.  Past Psychiatric History: H/O depression, PTSD and anxiety. 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 Tried Prozac and Wellbutrin but did not like, Zoloft caused sexual side effects and Cymbalta worked for a while. H/O group therapy.  No history of suicidal attempt or inpatient treatment. H/O DUI and incarceration for 9 months in military for the charges of sexual assault and indecent exposure.    Outpatient Encounter Medications as of 08/12/2023  Medication Sig   acetaminophen (TYLENOL) 500 MG tablet Take 1,500-2,000 mg by mouth every 6 (six) hours as needed (For foot pain).   amitriptyline (ELAVIL) 150 MG tablet Take 1 tablet (150 mg total) by mouth at bedtime.   atorvastatin (LIPITOR) 20 MG tablet Take 1 tablet (20 mg total) by mouth daily.   diazepam (VALIUM) 5 MG tablet Take 1 tablet (5 mg total) by mouth at bedtime as needed for anxiety.   dicyclomine (BENTYL) 10 MG capsule Take 1 capsule (10 mg total) by mouth 3 (three) times daily as needed (abdominal pain/ cramping).   mesalamine (APRISO) 0.375 g 24 hr capsule Take 1 capsule (0.375 g total) by mouth in the morning, at noon, in the evening, and at bedtime.   omeprazole (PRILOSEC) 40 MG capsule Take 1 capsule (40  mg total) by mouth daily.   ondansetron (ZOFRAN ODT) 4 MG disintegrating tablet Take 1 tablet (4 mg total) by mouth every 8 (eight) hours as needed for nausea or vomiting.   prazosin (MINIPRESS) 2 MG capsule Take 2 capsules (4 mg total) by mouth at bedtime.   pregabalin (LYRICA) 75 MG capsule Take 1 capsule (75 mg total) by mouth 3 (three) times daily.   Vitamin D, Ergocalciferol, (DRISDOL) 1.25 MG (50000 UNIT) CAPS capsule Take 1 capsule (50,000 Units total) by mouth every 7 (seven) days.    vortioxetine HBr (TRINTELLIX) 20 MG TABS tablet Take 1 tablet (20 mg total) by mouth daily.   No facility-administered encounter medications on file as of 08/12/2023.    Recent Results (from the past 2160 hour(s))  CBC with Differential/Platelet     Status: Abnormal   Collection Time: 06/23/23  1:35 PM  Result Value Ref Range   WBC 10.0 4.0 - 10.5 K/uL   RBC 4.31 4.22 - 5.81 Mil/uL   Hemoglobin 11.5 (L) 13.0 - 17.0 g/dL   HCT 16.1 (L) 09.6 - 04.5 %   MCV 85.4 78.0 - 100.0 fl   MCHC 31.3 30.0 - 36.0 g/dL   RDW 40.9 81.1 - 91.4 %   Platelets 413.0 (H) 150.0 - 400.0 K/uL   Neutrophils Relative % 77.8 (H) 43.0 - 77.0 %   Lymphocytes Relative 10.0 (L) 12.0 - 46.0 %   Monocytes Relative 11.1 3.0 - 12.0 %   Eosinophils Relative 0.2 0.0 - 5.0 %   Basophils Relative 0.9 0.0 - 3.0 %   Neutro Abs 7.8 (H) 1.4 - 7.7 K/uL   Lymphs Abs 1.0 0.7 - 4.0 K/uL   Monocytes Absolute 1.1 (H) 0.1 - 1.0 K/uL   Eosinophils Absolute 0.0 0.0 - 0.7 K/uL   Basophils Absolute 0.1 0.0 - 0.1 K/uL  Comprehensive metabolic panel     Status: None   Collection Time: 06/23/23  1:35 PM  Result Value Ref Range   Sodium 139 135 - 145 mEq/L   Potassium 4.2 3.5 - 5.1 mEq/L   Chloride 104 96 - 112 mEq/L   CO2 28 19 - 32 mEq/L   Glucose, Bld 88 70 - 99 mg/dL   BUN 13 6 - 23 mg/dL   Creatinine, Ser 7.82 0.40 - 1.50 mg/dL   Total Bilirubin 0.6 0.2 - 1.2 mg/dL   Alkaline Phosphatase 49 39 - 117 U/L   AST 16 0 - 37 U/L   ALT 12 0 - 53 U/L   Total Protein 7.2 6.0 - 8.3 g/dL   Albumin 4.7 3.5 - 5.2 g/dL   GFR 956.21 >30.86 mL/min    Comment: Calculated using the CKD-EPI Creatinine Equation (2021)   Calcium 9.4 8.4 - 10.5 mg/dL  Lipid panel     Status: Abnormal   Collection Time: 06/23/23  1:35 PM  Result Value Ref Range   Cholesterol 227 (H) 0 - 200 mg/dL    Comment: ATP III Classification       Desirable:  < 200 mg/dL               Borderline High:  200 - 239 mg/dL          High:  > = 578 mg/dL   Triglycerides 469.6  (H) 0.0 - 149.0 mg/dL    Comment: Normal:  <295 mg/dLBorderline High:  150 - 199 mg/dL   HDL 28.41 >32.44 mg/dL   VLDL 01.0 0.0 - 27.2 mg/dL   LDL  Cholesterol 116 (H) 0 - 99 mg/dL   Total CHOL/HDL Ratio 3     Comment:                Men          Women1/2 Average Risk     3.4          3.3Average Risk          5.0          4.42X Average Risk          9.6          7.13X Average Risk          15.0          11.0                       NonHDL 150.92     Comment: NOTE:  Non-HDL goal should be 30 mg/dL higher than patient's LDL goal (i.e. LDL goal of < 70 mg/dL, would have non-HDL goal of < 100 mg/dL)  Vitamin R60     Status: Abnormal   Collection Time: 06/23/23  1:35 PM  Result Value Ref Range   Vitamin B-12 130 (L) 211 - 911 pg/mL  IBC + Ferritin     Status: Abnormal   Collection Time: 06/23/23  1:35 PM  Result Value Ref Range   Iron 19 (L) 42 - 165 ug/dL   Transferrin 454.0 (H) 212.0 - 360.0 mg/dL   Saturation Ratios 3.5 (L) 20.0 - 50.0 %   Ferritin 3.7 (L) 22.0 - 322.0 ng/mL   TIBC 543.2 (H) 250.0 - 450.0 mcg/dL  VITAMIN D 25 Hydroxy (Vit-D Deficiency, Fractures)     Status: Abnormal   Collection Time: 06/23/23  1:35 PM  Result Value Ref Range   VITD 17.62 (L) 30.00 - 100.00 ng/mL     Psychiatric Specialty Exam: Physical Exam  Review of Systems  Weight 226 lb (102.5 kg).Body mass index is 30.65 kg/m.  General Appearance: Casual  Eye Contact:  Good  Speech:  Normal Rate  Volume:  Normal  Mood:  Dysphoric  Affect:  Congruent  Thought Process:  Goal Directed  Orientation:  Full (Time, Place, and Person)  Thought Content:  Rumination  Suicidal Thoughts:  No  Homicidal Thoughts:  No  Memory:  Immediate;   Good Recent;   Good Remote;   Fair  Judgement:  Intact  Insight:  Present  Psychomotor Activity:  Decreased  Concentration:  Concentration: Good and Attention Span: Good  Recall:  Good  Fund of Knowledge:  Good  Language:  Good  Akathisia:  No  Handed:  Right  AIMS (if  indicated):     Assets:  Communication Skills Desire for Improvement Housing Social Support Transportation  ADL's:  Intact  Cognition:  WNL  Sleep:  4 -5 hrs     Assessment/Plan: MDD (major depressive disorder), recurrent episode, moderate (HCC) - Plan: amitriptyline (ELAVIL) 150 MG tablet  PTSD (post-traumatic stress disorder) - Plan: amitriptyline (ELAVIL) 150 MG tablet  Cannabis use disorder, mild, abuse  Patient is a stable on his current medication.  He had trigger while he was on family reunion vacation visiting [place] but now he is going back to his baseline.  We discussed moments of irritability and episodes of frustration but he tends to manage very well and does not cause any aggression or violence.  He started going outside and trying to socialize with people.  I  encouraged to keep appointment with therapist.  I reviewed blood work results.  He has low vitamin D, anemia, increased cholesterol.  He is seeing GI for his ulcerative colitis.  I will continue amitriptyline 150 mg at bedtime.  He will continue Trintellix, Valium and Minipress from his primary care.  I suggested follow-up in 6 months but patient would like to have a follow-up in 4 months.  I recommend to call us back if is any question or any concern.   Follow Up Instructions:     I discussed the assessment and treatment plan with the patient. The patient was provided an opportunity to ask questions and all were answered. The patient agreed with the plan and demonstrated an understanding of the instructions.   The patient was advised to call back or seek an in-person evaluation if the symptoms worsen or if the condition fails to improve as anticipated.    Collaboration of Care: Other provider involved in patient's care AEB notes are available in epic to review.  Patient/Guardian was advised Release of Information must be obtained prior to any record release in order to collaborate their care with an  outside provider. Patient/Guardian was advised if they have not already done so to contact the registration department to sign all necessary forms in order for [place] to release information regarding their care.   Consent: Patient/Guardian gives verbal consent for treatment and assignment of benefits for services provided during this visit. Patient/Guardian expressed understanding and agreed to proceed.     I provided 21 minutes of non face to face time during this encounter.  Note: This document was prepared by [employer] voice dictation technology and any errors that results from this process are unintentional.    Cleotis Nipper, MD 08/12/2023

## 2023-08-21 ENCOUNTER — Ambulatory Visit (INDEPENDENT_AMBULATORY_CARE_PROVIDER_SITE_OTHER): Payer: BC Managed Care – PPO | Admitting: Clinical

## 2023-08-21 ENCOUNTER — Encounter (HOSPITAL_COMMUNITY): Payer: Self-pay

## 2023-08-21 DIAGNOSIS — Z91199 Patient's noncompliance with other medical treatment and regimen due to unspecified reason: Secondary | ICD-10-CM

## 2023-08-21 NOTE — Progress Notes (Signed)
Patient ID: Daniel Marsh, male   DOB: 1981/01/30, 42 y.o.   MRN: [id number]  Therapy Progress Note  Patient had an appointment scheduled with therapist on 08/21/2023  at 8:00am.  [name] called patient at 8:14am and left a voicemail offering to turn the session into a virtual one.  He did not arrive for the session by 8:30am, so was considered a no show.  As per [facility] policy, he will be be charged for this no-show appointment.    Encounter Diagnosis  Name Primary?   No-show for appointment Yes     Ambrose Mantle, LCSW 08/21/2023, 8:40 AM

## 2023-09-02 ENCOUNTER — Ambulatory Visit (INDEPENDENT_AMBULATORY_CARE_PROVIDER_SITE_OTHER): Payer: Medicare Other | Admitting: Clinical

## 2023-09-02 ENCOUNTER — Encounter (HOSPITAL_COMMUNITY): Payer: Self-pay | Admitting: Clinical

## 2023-09-02 DIAGNOSIS — F331 Major depressive disorder, recurrent, moderate: Secondary | ICD-10-CM | POA: Diagnosis not present

## 2023-09-02 DIAGNOSIS — F4310 Post-traumatic stress disorder, unspecified: Secondary | ICD-10-CM | POA: Diagnosis not present

## 2023-09-02 DIAGNOSIS — F1210 Cannabis abuse, uncomplicated: Secondary | ICD-10-CM

## 2023-09-02 DIAGNOSIS — F1010 Alcohol abuse, uncomplicated: Secondary | ICD-10-CM | POA: Diagnosis not present

## 2023-09-02 NOTE — Progress Notes (Unsigned)
THERAPIST PROGRESS NOTE  Session Time: 8:14-9:00am  Participation Level: Active  Behavioral Response: Casual Alert Negative, Depressed, and Tearful  Type of Therapy: Individual Therapy  Treatment Goals addressed:  New treatment plan established:  Problem: Depression Goal: LTG: Daniel "CJ" will score less than 9 on the Patient Health Questionnaire (PHQ-9) Goal: STG: Daniel "CJ" will identify cognitive patterns and beliefs that support depression Goal: LTG: CJ will learn additional communication techniques that can help better navigate his relationships. Goal: STG: CJ will explore personal core beliefs, rules and assumptions, and cognitive distortions through therapist using Cognitive Behavioral Therapy; learn how to develop replacement thoughts and challenge unhelpful thoughts. Intervention: Help CJ identify 4-5 personal goals for managing depression symptoms to work on during the current treatment episode Intervention: Therapist will educate patient on cognitive distortions and the rationale for treatment of depression and trauma Intervention: Help CJ identify 5-7 cognitive distortions they are currently using and write reframing statements to replace them Intervention: Use Cognitive Behavioral Therapy to explore patient's core beliefs, rules and assumptions, and cognitive distortions; teach how to develop replacement thoughts and challenge unhelpful thoughts.   Problem: PTSD Goal: LTG: Recall traumatic events without becoming overwhelmed with negative emotions Goal: STG: Daniel "CJ" will practice conflict resolution skills at least 2-3 times per week for the next 26 weeks Goal: Demonstrates progress in stages of grief at own pace Goal: LTG: Elimination of maladaptive behaviors and thinking patterns which interfere with resolution of trauma as evidenced by self-report of fewer crying spells and depressed days Goal: STG: Daniel "CJ" will practice emotion regulation  skills 2-3 time(s) per week for the next 26 week(s) Goal: STG: Report a decrease in PTSD symptoms as evidenced by a 50% reduction in overall score on a clinician administered PTSD assessment screen/scale Goal: LTG: Learn practical breathing/grounding/mindfulness techniques; demonstrate mastery in session then report independent use of these skills out of session. Intervention: Encourage Daniel "CJ" to practice breathing retraining for 10 minutes, 2 times per day Intervention: Teach Daniel "CJ" coping strategies (e.g., writing down thoughts and feelings in a journal; taking deep, slow breaths; calling a support person to talk about memories) to deal with overwhelming trauma memories and sudden emotional reactions Intervention: Increase Daniel "CJ"'s confidence in coping with PTSD symptoms by assigning them to list at least two positive actions or small successes daily in a journal; process these success experiences Intervention: Work with Daniel "CJ" to construct a list of the situations, people, & places that Daniel "CJ" evoke the most distressing symptoms; suggest that they keep a journal of instances of stress being triggered Intervention: Review progress and develop a plan for continued practice outside of individual therapy sessions with Daniel "CJ" Intervention: Administer PCL-5 (Post traumatic Checklist for DSM-5) at monthly intervals for the next 26 weeks Intervention: Teach coping skills and help patient to increase resilience through CBT techniques, forgiveness curriculum, and shame work   Problem: Substance Use Goal: LTG: Explore Stages of Change and ambivalence about stopping the use of alcohol and marijuana Goal: STG: Determine goals about the substances CJ uses Intervention: Work with Daniel "CJ" to develop a Photographer" including their coping strategies that promote wellness Intervention: Use motivational interviewing to explore CJ's desires to  reduce or stop his substance use.  ProgressTowards Goals: Progressing  Interventions: Supportive, Meditation: breathing and grounding techniques, and Other: grief and loss counseling  Summary: Daniel Marsh is a 42 y.o. male who presents with depression and PTSD for therapy.  His physical pain has been  much worse lately which is drastically affecting his mental health, he reports.  He has recently had a family reunion which was stressful but good, and a family vacation for a week in [place].  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  This caused him an emotional breakdown and he cried for 2 days.  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  He stated that he feels angry and broken and he has been on edge every day since returning home, approximately early August.  He is taking one day at a time and we discussed the universality of this concept from Alcoholics Anonymous to mental health patients to high-level jobs.  He is enjoying a renewed relationship with his older children after a 2-year gap when they would not talk to him because of him splitting up with their mother.  He is regretful to not be with them, but he realizes he had to make the decision to pursue happiness.  [name] pointed out that even without saying a word, he is demonstrating to his children that it is okay for them to pursue their own happiness.  He expressed concern about one son who is overweight, stating that because he is the outside he feels responsible for his children's problems.  We reasoned this through and he was able to acknowledge that is not in his  control, other than his example and encouragement to son.    The remainder of session was spent talking about various tools including behavioral activation by doing things his wife suggests such as going to [place] to get together with other veterans.  [name] suggested journaling more, specifically about his anger and feelings of lack of control.  [name] suggested writing a letter to those he served as a means of trying to get some closure with them.  We talked about possibly burning the letter in a symbol of giving it up to God.  [name] taught him 5-finger breathing and 5-4-3-2-1 grounding.  [name] touched on the idea of forgiveness and asked if he could make a list of emotions that he has experienced because of his job in [employer] as well as how his behaviors have changed since then.  He was agreeable to all this.  Suicidal/Homicidal: No without intent/plan  Therapist Response: Patient is progressing AEB engaging in scheduled therapy session.  He presented oriented x5 and stated he was feeling "not good."  [name] evaluated patient's medication compliance and self-care since last session.  Patient shared what he has been experiencing physically, mentally, and emotionally in recent months.  [name] taught various coping skills.  Patient received these willingly and could discuss them in a way to indicate good comprehension.  [name] assigned patient the task of trying out these new coping skills (journaling, writing a letter to those he served, 5-finger breathing, 5-4-3-2-1 grounding, listing emotions and changed behaviors) prior to next session on 10/16.  [name] encouraged patient to schedule more therapy sessions for the future, as 10/16  Throughout the session, [name] gave patient the opportunity to explore thoughts and feelings associated with current life situations  and past/present external stressors.   [name] encouraged patient's expression of feelings and validated patient's thoughts using empathy, active listening, open body  language, and unconditional positive regard.       Recommendations:  Return to therapy in 2 weeks, engage in self care behaviors, journal about his feelings, write letter to those he served (possibly to burn), use 5-finger breathing and 5-4-3-2-1 grounding technique (worksheet given), make list of emotions he has experienced in relation to his particular job in [employer] and the behaviors he has changed as a result  Plan: Return again in 2 weeks.  Next appointment:  10/16  Diagnosis:  PTSD (post-traumatic stress disorder)  MDD (major depressive disorder), recurrent episode, moderate (HCC)  Cannabis use disorder, mild, abuse  Mild alcohol use disorder  Tetrahydrocannabinol (THC) use disorder, mild, abuse  Collaboration of Care: Psychiatrist AEB - read psychiatrist note; therapy notes are available to psychiatrist in Epic  Patient/Guardian was advised Release of Information must be obtained prior to any record release in order to collaborate their care with an outside provider. Patient/Guardian was advised if they have not already done so to contact the registration department to sign all necessary forms in order for [place] to release information regarding their care.   Consent: Patient/Guardian gives verbal consent for treatment and assignment of benefits for services provided during this visit. Patient/Guardian expressed understanding and agreed to proceed.   Lynnell Chad, LCSW 09/02/2023

## 2023-09-09 NOTE — Progress Notes (Signed)
PROCEDURE RECORD [facility]   Name: Daniel Marsh DOB:Apr 07, 1981 MRN: [id number]  Date:09/09/2023  Physician: Claudette Laws, MD    Nurse/CMA:   Allergies: No Known Allergies  Consent Signed: Yes.    Is patient diabetic? No.  CBG today? .  Pregnant: No. LMP: No LMP for male patient. (age 40-55)  Anticoagulants: no Anti-inflammatory: no Antibiotics: no  Procedure: Left SI ESI  Position: Prone Start Time: 3:21 PM  End Time: 3:25  Fluoro Time: 18  RN/CMA [name]  [name] MA    Time 3:01pm 3:28 PM    BP 143/81 139/83    Pulse 85 76    Respirations 16 16    O2 Sat 99 98    S/S 6 6    Pain Level 9/10 7/10     D/C home with Wife, patient A & O X 3, D/C instructions reviewed, and sits independently.

## 2023-09-15 ENCOUNTER — Encounter: Payer: Self-pay | Admitting: Physical Medicine & Rehabilitation

## 2023-09-15 ENCOUNTER — Encounter
Payer: BC Managed Care – PPO | Attending: Physical Medicine & Rehabilitation | Admitting: Physical Medicine & Rehabilitation

## 2023-09-15 VITALS — BP 143/81 | HR 85 | Temp 98.2°F | Ht 72.0 in | Wt 234.0 lb

## 2023-09-15 DIAGNOSIS — M5416 Radiculopathy, lumbar region: Secondary | ICD-10-CM | POA: Insufficient documentation

## 2023-09-15 MED ORDER — LIDOCAINE HCL 1 % IJ SOLN
5.0000 mL | Freq: Once | INTRAMUSCULAR | Status: AC
  Administered 2023-09-15: 5 mL

## 2023-09-15 MED ORDER — LIDOCAINE HCL (PF) 1 % IJ SOLN
2.0000 mL | Freq: Once | INTRAMUSCULAR | Status: AC
  Administered 2023-09-15: 2 mL

## 2023-09-15 MED ORDER — DEXAMETHASONE SODIUM PHOSPHATE 10 MG/ML IJ SOLN
10.0000 mg | Freq: Once | INTRAMUSCULAR | Status: AC
  Administered 2023-09-15: 10 mg

## 2023-09-15 MED ORDER — IOHEXOL 180 MG/ML  SOLN
2.0000 mL | Freq: Once | INTRAMUSCULAR | Status: AC
  Administered 2023-09-15: 2 mL

## 2023-09-15 NOTE — Patient Instructions (Signed)

## 2023-09-15 NOTE — Progress Notes (Signed)
Left S1 sacral transforaminal epidural steroid injection under fluoroscopic guidance with contrast enhancement  Indication: Lumbosacral radiculitis is not relieved by medication management or other conservative care and interfering with self-care and mobility.   Informed consent was obtained after describing risk and benefits of the procedure with the patient, this includes bleeding, bruising, infection, paralysis and medication side effects.  The patient wishes to proceed and has given written consent.  Patient was placed in prone position.  The lumbar area was marked and prepped with Betadine.  It was entered with a 25-gauge 1-1/2 inch needle and one mL of 1% lidocaine was injected into the skin and subcutaneous tissue.  Then a 22-gauge 3.5in spinal needle was inserted into the Left S1 foramen intervertebral foramen under AP, lateral, and oblique view.  Once needle tip was within the foramen on lateral views an dnor exceeding 6 o clock position on th epedical on AP viewed Isovue 200 was inected x 2ml Then a solution containing one mL of 10 mg per mL dexamethasone and 2 mL of 1% lidocaine was injected.  The patient tolerated procedure well.  Post procedure instructions were given.  Please see post procedure form.   Dexamethasone 10mg  Lidocaine PF 2ml Lidocaine 1% Pres 5mL Omnipaque 3mL Lidocaine 1% 5ml

## 2023-09-16 ENCOUNTER — Ambulatory Visit (HOSPITAL_COMMUNITY): Payer: BC Managed Care – PPO | Admitting: Clinical

## 2023-09-16 ENCOUNTER — Encounter (HOSPITAL_COMMUNITY): Payer: Self-pay | Admitting: Clinical

## 2023-09-16 DIAGNOSIS — F1010 Alcohol abuse, uncomplicated: Secondary | ICD-10-CM | POA: Diagnosis not present

## 2023-09-16 DIAGNOSIS — F4310 Post-traumatic stress disorder, unspecified: Secondary | ICD-10-CM | POA: Diagnosis not present

## 2023-09-16 DIAGNOSIS — F331 Major depressive disorder, recurrent, moderate: Secondary | ICD-10-CM | POA: Diagnosis not present

## 2023-09-16 DIAGNOSIS — F1210 Cannabis abuse, uncomplicated: Secondary | ICD-10-CM
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
## 2023-09-30 ENCOUNTER — Encounter (HOSPITAL_COMMUNITY): Payer: Self-pay | Admitting: Clinical

## 2023-09-30 ENCOUNTER — Ambulatory Visit (INDEPENDENT_AMBULATORY_CARE_PROVIDER_SITE_OTHER): Payer: BC Managed Care – PPO | Admitting: Clinical

## 2023-09-30 DIAGNOSIS — F1010 Alcohol abuse, uncomplicated: Secondary | ICD-10-CM

## 2023-09-30 DIAGNOSIS — F4310 Post-traumatic stress disorder, unspecified: Secondary | ICD-10-CM | POA: Diagnosis not present

## 2023-09-30 DIAGNOSIS — F331 Major depressive disorder, recurrent, moderate: Secondary | ICD-10-CM | POA: Diagnosis not present

## 2023-09-30 DIAGNOSIS — F1210 Cannabis abuse, uncomplicated: Secondary | ICD-10-CM | POA: Diagnosis not present
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
## 2023-10-14 ENCOUNTER — Encounter (HOSPITAL_COMMUNITY): Payer: Self-pay | Admitting: Clinical

## 2023-10-14 ENCOUNTER — Ambulatory Visit (HOSPITAL_COMMUNITY): Payer: BC Managed Care – PPO | Admitting: Clinical

## 2023-10-14 DIAGNOSIS — F4310 Post-traumatic stress disorder, unspecified: Secondary | ICD-10-CM | POA: Diagnosis not present

## 2023-10-14 DIAGNOSIS — F331 Major depressive disorder, recurrent, moderate: Secondary | ICD-10-CM

## 2023-10-14 DIAGNOSIS — F1210 Cannabis abuse, uncomplicated: Secondary | ICD-10-CM

## 2023-10-14 DIAGNOSIS — F1010 Alcohol abuse, uncomplicated: Secondary | ICD-10-CM | POA: Diagnosis not present
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
## 2023-10-28 ENCOUNTER — Ambulatory Visit (INDEPENDENT_AMBULATORY_CARE_PROVIDER_SITE_OTHER): Payer: Medicare Other | Admitting: Clinical

## 2023-10-28 DIAGNOSIS — Z91199 Patient's noncompliance with other medical treatment and regimen due to unspecified reason: Secondary | ICD-10-CM

## 2023-10-28 NOTE — Progress Notes (Signed)
Patient ID: Daniel Marsh, male   DOB: 03-20-1981, 42 y.o.   MRN: [id number]  Therapy Progress Note  Patient had an appointment scheduled with therapist on 10/28/2023  at 10:00am.  [name] called patient at 10:18am and left a HIPAA-compliant message.  He did not arrive for the session by 10:28am, so was considered a no show.    Encounter Diagnosis  Name Primary?   No-show for appointment Yes     Ambrose Mantle, LCSW 10/28/2023, 10:28 AM

## 2023-11-12 NOTE — Progress Notes (Unsigned)
Subjective:    Patient ID: Daniel Marsh, male    DOB: [birth year], 42 y.o.   MRN: [id number]  HPI   Pain Inventory Average Pain {NUMBERS; 0-10:5044} Pain Right Now {NUMBERS; 0-10:5044} My pain is {PAIN DESCRIPTION:21022940}  In the last 24 hours, has pain interfered with the following? General activity {NUMBERS; 0-10:5044} Relation with others {NUMBERS; 0-10:5044} Enjoyment of life {NUMBERS; 0-10:5044} What TIME of day is your pain at its worst? {time of day:24191} Sleep (in general) {BHH GOOD/FAIR/POOR:22877}  Pain is worse with: {ACTIVITIES:21022942} Pain improves with: {PAIN IMPROVES UXLK:44010272} Relief from Meds: {NUMBERS; 0-10:5044}  Family History  Problem Relation Age of Onset   Osteoarthritis Mother    Depression Mother    Heart attack Mother 32   Osteoarthritis Father    Depression Father    Bladder Cancer Father        in remission   Prostate cancer Father    Depression Brother    Depression Brother    Diabetes Maternal Grandmother    Hyperlipidemia Maternal Grandmother    Hypertension Maternal Grandmother    Heart attack Maternal Grandmother    Osteoarthritis Maternal Grandfather    COPD Maternal Grandfather    Kidney disease Maternal Grandfather    Renal cancer Maternal Grandfather    Alcohol abuse Paternal Grandmother    COPD Paternal Grandmother    Diabetes Paternal Grandmother    Hypertension Paternal Grandmother    Lung cancer Paternal Grandmother    Osteoarthritis Paternal Grandfather    Heart attack Paternal Grandfather    Heart failure Paternal Grandfather    Pancreatic cancer Maternal Aunt    Colon cancer Maternal Uncle    Lung cancer Paternal Aunt    Social History   Socioeconomic History   Marital status: Married    Spouse name: Not on file   Number of children: 3   Years of education: Not on file   Highest education level: Not on file  Occupational History   Occupation: Disabled  Tobacco Use   Smoking status: Every  Day    Current packs/day: 0.25    Average packs/day: 0.3 packs/day for 15.0 years (3.8 ttl pk-yrs)    Types: Cigarettes, Cigars   Smokeless tobacco: Current    Types: Chew   Tobacco comments:    I am usually a social smoker and he "dips" occ  Vaping Use   Vaping status: Never Used  Substance and Sexual Activity   Alcohol use: Yes    Alcohol/week: 12.0 standard drinks of alcohol    Types: 12 Cans of beer per week   Drug use: Yes    Types: Marijuana    Comment: Was using to self medicate for depression and anxiety   Sexual activity: Yes    Partners: Female    Birth control/protection: I.U.D.    Comment: My wife currently has the [facility] IUD  Other Topics Concern   Not on file  Social History Narrative   ** Merged History Encounter **       Was in the [employer] for 7 years, required lumbar surgery while in the [employer] and had poor outcome Divorced and re-married 2 yo son    Social Drivers of Corporate investment banker Strain: Not on file  Food Insecurity: No Food Insecurity (01/07/2023)   Hunger Vital Sign    Worried About Running Out of Food in the Last Year: Never true    Ran Out of Food in the Last Year: Never true  Transportation Needs: No  Transportation Needs (02/02/2023)   PRAPARE - Administrator, Civil Service (Medical): No    Lack of Transportation (Non-Medical): No  Physical Activity: Not on file  Stress: Not on file  Social Connections: Not on file   Past Surgical History:  Procedure Laterality Date   ANKLE SURGERY Right 2011   KNEE SURGERY Left 2020   SPINE SURGERY  2013   Lumbar fusion L4-S1   WISDOM TOOTH EXTRACTION     Past Surgical History:  Procedure Laterality Date   ANKLE SURGERY Right 2011   KNEE SURGERY Left 2020   SPINE SURGERY  2013   Lumbar fusion L4-S1   WISDOM TOOTH EXTRACTION     Past Medical History:  Diagnosis Date   Anemia    Anxiety    Arthritis    Depression    Hypertension    Liver disease    Myocardial  infarction (HCC)    PTSD (post-traumatic stress disorder)    Ulcerative colitis (HCC) 2019   There were no vitals taken for this visit.  Opioid Risk Score:   Fall Risk Score:  `1  Depression screen PHQ 2/9     09/15/2023    3:15 PM 09/15/2023    3:01 PM 06/23/2023    1:03 PM 02/06/2023   10:00 AM 02/05/2023   12:23 PM 02/02/2023    1:05 PM 06/24/2022    2:55 PM  Depression screen PHQ 2/9  Decreased Interest 1 0 2 2  3  0  Down, Depressed, Hopeless 1 0 2 2  3  0  PHQ - 2 Score 2 0 4 4  6  0  Altered sleeping   2   3 0  Tired, decreased energy   2   3 0  Change in appetite   2   1 0  Feeling bad or failure about yourself    2   3 0  Trouble concentrating   2   3 0  Moving slowly or fidgety/restless   1   3 0  Suicidal thoughts   1   0 0  PHQ-9 Score   16   22 0  Difficult doing work/chores   Somewhat difficult    Not difficult at all     Information is confidential and restricted. Go to Review Flowsheets to unlock data.    Review of Systems     Objective:   Physical Exam        Assessment & Plan:

## 2023-11-13 ENCOUNTER — Encounter: Payer: BC Managed Care – PPO | Attending: Physical Medicine & Rehabilitation | Admitting: Registered Nurse

## 2023-11-13 DIAGNOSIS — M5416 Radiculopathy, lumbar region: Secondary | ICD-10-CM | POA: Insufficient documentation

## 2023-12-09 ENCOUNTER — Telehealth (HOSPITAL_COMMUNITY): Payer: BC Managed Care – PPO | Admitting: Psychiatry

## 2023-12-17 ENCOUNTER — Encounter (HOSPITAL_COMMUNITY): Payer: Self-pay | Admitting: Clinical

## 2023-12-17 ENCOUNTER — Ambulatory Visit (INDEPENDENT_AMBULATORY_CARE_PROVIDER_SITE_OTHER): Payer: Medicare Other | Admitting: Clinical

## 2023-12-17 DIAGNOSIS — F4310 Post-traumatic stress disorder, unspecified: Secondary | ICD-10-CM

## 2023-12-17 DIAGNOSIS — F331 Major depressive disorder, recurrent, moderate: Secondary | ICD-10-CM | POA: Diagnosis not present

## 2023-12-17 DIAGNOSIS — F1010 Alcohol abuse, uncomplicated: Secondary | ICD-10-CM

## 2023-12-17 DIAGNOSIS — F1210 Cannabis abuse, uncomplicated: Secondary | ICD-10-CM

## 2023-12-17 NOTE — Progress Notes (Signed)
THERAPIST PROGRESS NOTE  Session Time: 8:05am-8:48am  Participation Level: Active  Virtual Visit via Video Note  I connected with Daniel Marsh on 12/17/23 at  8:00 AM EST by a video enabled telemedicine application and verified that I am speaking with the correct person using two identifiers.  Location: Patient: Home Provider: [facility] outpatient therapy office   I discussed the limitations of evaluation and management by telemedicine and the availability of in person appointments. The patient expressed understanding and agreed to proceed.   I discussed the assessment and treatment plan with the patient. The patient was provided an opportunity to ask questions and all were answered. The patient agreed with the plan and demonstrated an understanding of the instructions.   The patient was advised to call back or seek an in-person evaluation if the symptoms worsen or if the condition fails to improve as anticipated.  I provided 43 minutes of non-face-to-face time during this encounter.  Lynnell Chad, LCSW   Behavioral Response: Casual Alert Anxious and Depressed  Type of Therapy: Individual Therapy  Treatment Goals addressed:  Goal: LTG: Daniel "CJ" will score less than 9 on the Patient Health Questionnaire (PHQ-9) Goal: STG: Daniel "CJ" will identify cognitive patterns and beliefs that support depression Goal: LTG: CJ will learn additional communication techniques that can help better navigate his relationships. Goal: STG: CJ will explore personal core beliefs, rules and assumptions, and cognitive distortions through therapist using Cognitive Behavioral Therapy; learn how to develop replacement thoughts and challenge unhelpful thoughts. Goal: LTG: Recall traumatic events without becoming overwhelmed with negative emotions Goal: STG: Daniel "CJ" will practice conflict resolution skills at least 2-3 times per week for the next 26 weeks Goal:  Demonstrates progress in stages of grief at own pace Goal: LTG: Elimination of maladaptive behaviors and thinking patterns which interfere with resolution of trauma as evidenced by self-report of fewer crying spells and depressed days Goal: STG: Daniel "CJ" will practice emotion regulation skills 2-3 time(s) per week for the next 26 week(s) Goal: STG: Report a decrease in PTSD symptoms as evidenced by a 50% reduction in overall score on a clinician administered PTSD assessment screen/scale Goal: LTG: Learn practical breathing/grounding/mindfulness techniques; demonstrate mastery in session then report independent use of these skills out of session. Intervention: Encourage Daniel "CJ" to practice breathing retraining for 10 minutes, 2 times per day Goal: LTG: Explore Stages of Change and ambivalence about stopping the use of alcohol and marijuana Goal: STG: Determine goals about the substances CJ uses  ProgressTowards Goals: Progressing  Interventions: CBT, Supportive, and Other: work on hierarchical exposure    Summary: Daniel Marsh is a 43 y.o. male who presents with depression and PTSD for therapy.  [name] plan was to administer PTSD screening next session, the PCL-C that can be done on-line, but because session was virtual and he was occupied getting son ready for school as we talked, delayed this.  He presented oriented x5 and stated he was feeling "steady again, but it was tough, it was one of the roughest stretches I've had in a really long time."  [name] evaluated patient's medication compliance, use of coping tools, and self-care, as applicable.   He talked about how hard the holidays were for him, but said things are starting to get better.  He shared the reasons for the tough patch were the holidays, turning 42 on 12/19, thinking about where he is as opposed to where he thought he would be at this time of his life, and realizing  there are people he thought would be in his life who  are not.  He does feel like this is starting to get better, but for awhile he was scared because he was back in a place where "nothing mattered."  Everything is starting to matter to him again now.  For his birthday, his wife had his baby brother to fly in from [place] and his college friends travelled to see him for the day.  He talked to his best friend's mother and was able to hold it together, allow her to express her grief, and they made plans to get together.  He continues to journal, which is helpful in getting his feelings out, and he has started drawing again.  Behavioral activation was explained to him.  We also discussed that his physical pain is likely permanent and he is working to compartmentalize it.  He misses social interactions but when he does engage in such, his social anxiety is significantly heightened.  He was pleased to share that he and his son went to a movie theatre this week, which was the first time he has done this in 4 years.  [name] explained about doing desensitization with a hierarchy of exposure.  Some of the things he cannot do currently or can just barely do were listed, such as going to the grocery store, going to the mall, attending a [employer] (ice hockey) game, and being in the car pool line.  Prior to the end of the session he was asked to make a list of situations he has difficulty in due to social anxiety and then put the items in the order of which are the hardest to the easiest.  It was explained that we will talk about tackling the easier ones first.  His son, whom he was readying for school, asked the patient while we were meeting what PTSD is because his mother told him that daddy has that, and patient asked [name] to explain it to the child.  This was done to their satisfaction.  He does feel that therapy is helping, was able to go to the cemetery for the first time in 2 years to put flowers on his aunt's grave.  Suicidal/Homicidal: No without intent/plan  Therapist  Response:  Patient is progressing AEB engaging in scheduled therapy session.  Throughout the session, [name] gave patient the opportunity to explore thoughts and feelings associated with current life situations and past/present stressors.   [name] challenged patient gently and appropriately to consider different ways of looking at reported issues. [name] encouraged patient's expression of feelings and validated these using empathy, active listening, open body language, and unconditional positive regard.   [name] encouraged patient to schedule more therapy sessions for the future, as needed, since he only has 2 appointments left currently.      Recommendations:  Return to therapy in 2 weeks, engage in self care behaviors, continue when it is possible to journal about his feelings and draw for relief, continue when possible to write letter to those he served when he finds the strength and rest that he needs to resume, start making a list of situations that provoke his anxiety and hypervigilance -- we will then work on putting items in the order hardest to least hard.  Plan: Return again in 2 weeks on 1/27  Diagnosis:  PTSD (post-traumatic stress disorder)  MDD (major depressive disorder), recurrent episode, moderate (HCC)  Cannabis use disorder, mild, abuse  Mild alcohol use disorder  Collaboration of Care: Psychiatrist  AEB - therapist can read psychiatrist notes;  therapy notes are available to psychiatrist in Epic  Patient/Guardian was advised Release of Information must be obtained prior to any record release in order to collaborate their care with an outside provider. Patient/Guardian was advised if they have not already done so to contact the registration department to sign all necessary forms in order for [place] to release information regarding their care.   Consent: Patient/Guardian gives verbal consent for treatment and assignment of benefits for services provided during this visit. Patient/Guardian  expressed understanding and agreed to proceed.   Lynnell Chad, LCSW 12/17/2023

## 2023-12-22 ENCOUNTER — Telehealth (HOSPITAL_BASED_OUTPATIENT_CLINIC_OR_DEPARTMENT_OTHER): Payer: Self-pay | Admitting: Psychiatry

## 2023-12-22 ENCOUNTER — Encounter (HOSPITAL_COMMUNITY): Payer: Self-pay

## 2023-12-22 DIAGNOSIS — Z91199 Patient's noncompliance with other medical treatment and regimen due to unspecified reason: Secondary | ICD-10-CM

## 2023-12-22 NOTE — Progress Notes (Signed)
No show.  Patient is not available on video platform.  I called and left a voicemail to schedule appointment.

## 2023-12-28 ENCOUNTER — Ambulatory Visit (INDEPENDENT_AMBULATORY_CARE_PROVIDER_SITE_OTHER): Payer: Self-pay | Admitting: Clinical

## 2023-12-28 ENCOUNTER — Encounter (HOSPITAL_COMMUNITY): Payer: Self-pay

## 2023-12-28 DIAGNOSIS — Z91199 Patient's noncompliance with other medical treatment and regimen due to unspecified reason: Secondary | ICD-10-CM

## 2023-12-28 NOTE — Progress Notes (Signed)
Therapy Progress Note  Patient had an appointment scheduled with therapist on 12/28/2023  at 9:00am.  [name] called patient at 9:23am and left a HIPAA-compliant voicemail.  He did not arrive for the session by 9:27am, so was considered a no show.  As per [facility] policy, he will be be charged for this no-show appointment.    Encounter Diagnosis  Name Primary?   No-show for appointment Yes     Ambrose Mantle, LCSW 12/28/2023, 9:28 AM

## 2024-01-15 ENCOUNTER — Ambulatory Visit (INDEPENDENT_AMBULATORY_CARE_PROVIDER_SITE_OTHER): Payer: Medicare Other | Admitting: Clinical

## 2024-01-15 DIAGNOSIS — Z91198 Patient's noncompliance with other medical treatment and regimen for other reason: Secondary | ICD-10-CM
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
## 2024-03-30 ENCOUNTER — Encounter (HOSPITAL_COMMUNITY): Payer: Self-pay | Admitting: Clinical

## 2024-03-30 ENCOUNTER — Ambulatory Visit (INDEPENDENT_AMBULATORY_CARE_PROVIDER_SITE_OTHER): Payer: BC Managed Care – PPO | Admitting: Clinical

## 2024-03-30 DIAGNOSIS — F1010 Alcohol abuse, uncomplicated: Secondary | ICD-10-CM | POA: Diagnosis not present

## 2024-03-30 DIAGNOSIS — F331 Major depressive disorder, recurrent, moderate: Secondary | ICD-10-CM | POA: Diagnosis not present

## 2024-03-30 DIAGNOSIS — F4310 Post-traumatic stress disorder, unspecified: Secondary | ICD-10-CM | POA: Diagnosis not present

## 2024-03-30 DIAGNOSIS — F1210 Cannabis abuse, uncomplicated: Secondary | ICD-10-CM | POA: Diagnosis not present
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
## 2024-03-31 ENCOUNTER — Encounter (HOSPITAL_COMMUNITY): Payer: Self-pay | Admitting: Psychiatry

## 2024-03-31 ENCOUNTER — Telehealth (HOSPITAL_BASED_OUTPATIENT_CLINIC_OR_DEPARTMENT_OTHER): Admitting: Psychiatry

## 2024-03-31 VITALS — Wt 240.0 lb

## 2024-03-31 DIAGNOSIS — F331 Major depressive disorder, recurrent, moderate: Secondary | ICD-10-CM | POA: Diagnosis not present

## 2024-03-31 DIAGNOSIS — F1210 Cannabis abuse, uncomplicated: Secondary | ICD-10-CM

## 2024-03-31 DIAGNOSIS — F4310 Post-traumatic stress disorder, unspecified: Secondary | ICD-10-CM | POA: Diagnosis not present

## 2024-03-31 MED ORDER — AMITRIPTYLINE HCL 150 MG PO TABS: 150.0000 mg | ORAL_TABLET | Freq: Every day | ORAL | 0 refills | Status: DC

## 2024-03-31 MED ORDER — VORTIOXETINE HBR 20 MG PO TABS: 20.0000 mg | ORAL_TABLET | Freq: Every day | ORAL | 0 refills | Status: DC
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
## 2024-04-06 ENCOUNTER — Encounter (HOSPITAL_COMMUNITY): Payer: Self-pay | Admitting: Clinical

## 2024-04-06 ENCOUNTER — Ambulatory Visit (INDEPENDENT_AMBULATORY_CARE_PROVIDER_SITE_OTHER): Admitting: Clinical

## 2024-04-06 DIAGNOSIS — F331 Major depressive disorder, recurrent, moderate: Secondary | ICD-10-CM | POA: Diagnosis not present

## 2024-04-06 DIAGNOSIS — F4310 Post-traumatic stress disorder, unspecified: Secondary | ICD-10-CM

## 2024-04-06 DIAGNOSIS — F1210 Cannabis abuse, uncomplicated: Secondary | ICD-10-CM

## 2024-04-06 DIAGNOSIS — F1010 Alcohol abuse, uncomplicated: Secondary | ICD-10-CM | POA: Diagnosis not present
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
## 2024-04-22 ENCOUNTER — Emergency Department (HOSPITAL_BASED_OUTPATIENT_CLINIC_OR_DEPARTMENT_OTHER)

## 2024-04-22 ENCOUNTER — Emergency Department (HOSPITAL_BASED_OUTPATIENT_CLINIC_OR_DEPARTMENT_OTHER)
Admission: EM | Admit: 2024-04-22 | Discharge: 2024-04-22 | Disposition: A | Discharge status: Home / Self Care | Attending: Emergency Medicine | Admitting: Emergency Medicine

## 2024-04-22 ENCOUNTER — Other Ambulatory Visit: Payer: Self-pay

## 2024-04-22 ENCOUNTER — Encounter (HOSPITAL_BASED_OUTPATIENT_CLINIC_OR_DEPARTMENT_OTHER): Payer: Self-pay

## 2024-04-22 DIAGNOSIS — R110 Nausea: Secondary | ICD-10-CM | POA: Insufficient documentation

## 2024-04-22 DIAGNOSIS — Y9367 Activity, basketball: Secondary | ICD-10-CM | POA: Insufficient documentation

## 2024-04-22 DIAGNOSIS — F329 Major depressive disorder, single episode, unspecified: Secondary | ICD-10-CM | POA: Insufficient documentation

## 2024-04-22 DIAGNOSIS — R030 Elevated blood-pressure reading, without diagnosis of hypertension: Secondary | ICD-10-CM | POA: Insufficient documentation

## 2024-04-22 DIAGNOSIS — R197 Diarrhea, unspecified: Secondary | ICD-10-CM | POA: Insufficient documentation

## 2024-04-22 DIAGNOSIS — R1012 Left upper quadrant pain: Secondary | ICD-10-CM | POA: Diagnosis not present

## 2024-04-22 DIAGNOSIS — E663 Overweight: Secondary | ICD-10-CM | POA: Insufficient documentation

## 2024-04-22 DIAGNOSIS — I10 Essential (primary) hypertension: Secondary | ICD-10-CM | POA: Insufficient documentation

## 2024-04-22 DIAGNOSIS — F418 Other specified anxiety disorders: Secondary | ICD-10-CM | POA: Insufficient documentation

## 2024-04-22 DIAGNOSIS — G4489 Other headache syndrome: Secondary | ICD-10-CM | POA: Insufficient documentation

## 2024-04-22 DIAGNOSIS — F17200 Nicotine dependence, unspecified, uncomplicated: Secondary | ICD-10-CM | POA: Insufficient documentation

## 2024-04-22 DIAGNOSIS — M5136 Other intervertebral disc degeneration, lumbar region with discogenic back pain only: Secondary | ICD-10-CM | POA: Insufficient documentation

## 2024-04-22 DIAGNOSIS — D649 Anemia, unspecified: Secondary | ICD-10-CM | POA: Diagnosis not present

## 2024-04-22 DIAGNOSIS — M545 Low back pain, unspecified: Secondary | ICD-10-CM | POA: Insufficient documentation

## 2024-04-22 DIAGNOSIS — M5459 Other low back pain: Secondary | ICD-10-CM | POA: Insufficient documentation

## 2024-04-22 DIAGNOSIS — M51369 Other intervertebral disc degeneration, lumbar region without mention of lumbar back pain or lower extremity pain: Secondary | ICD-10-CM | POA: Insufficient documentation

## 2024-04-22 DIAGNOSIS — M5126 Other intervertebral disc displacement, lumbar region: Secondary | ICD-10-CM | POA: Insufficient documentation

## 2024-04-22 DIAGNOSIS — Z5689 Other problems related to employment: Secondary | ICD-10-CM | POA: Insufficient documentation

## 2024-04-22 DIAGNOSIS — H527 Unspecified disorder of refraction: Secondary | ICD-10-CM | POA: Insufficient documentation

## 2024-04-22 DIAGNOSIS — M79673 Pain in unspecified foot: Secondary | ICD-10-CM | POA: Insufficient documentation

## 2024-04-22 DIAGNOSIS — H9319 Tinnitus, unspecified ear: Secondary | ICD-10-CM | POA: Insufficient documentation

## 2024-04-22 DIAGNOSIS — R109 Unspecified abdominal pain: Secondary | ICD-10-CM | POA: Diagnosis not present

## 2024-04-22 DIAGNOSIS — H52229 Regular astigmatism, unspecified eye: Secondary | ICD-10-CM | POA: Insufficient documentation

## 2024-04-22 DIAGNOSIS — M25579 Pain in unspecified ankle and joints of unspecified foot: Secondary | ICD-10-CM | POA: Insufficient documentation

## 2024-04-22 DIAGNOSIS — G629 Polyneuropathy, unspecified: Secondary | ICD-10-CM | POA: Insufficient documentation

## 2024-04-22 LAB — LIPASE, BLOOD: Lipase: 33 U/L (ref 11–51)

## 2024-04-22 LAB — URINALYSIS, ROUTINE W REFLEX MICROSCOPIC
Bilirubin Urine: NEGATIVE
Glucose, UA: NEGATIVE mg/dL
Hgb urine dipstick: NEGATIVE
Ketones, ur: NEGATIVE mg/dL
Leukocytes,Ua: NEGATIVE
Nitrite: NEGATIVE
Protein, ur: NEGATIVE mg/dL
Specific Gravity, Urine: 1.009 (ref 1.005–1.030)
pH: 6.5 (ref 5.0–8.0)

## 2024-04-22 LAB — COMPREHENSIVE METABOLIC PANEL WITH GFR
ALT: 15 U/L (ref 0–44)
AST: 22 U/L (ref 15–41)
Albumin: 4.7 g/dL (ref 3.5–5.0)
Alkaline Phosphatase: 57 U/L (ref 38–126)
Anion gap: 14 (ref 5–15)
BUN: 8 mg/dL (ref 6–20)
CO2: 24 mmol/L (ref 22–32)
Calcium: 9.6 mg/dL (ref 8.9–10.3)
Chloride: 105 mmol/L (ref 98–111)
Creatinine, Ser: 1.03 mg/dL (ref 0.61–1.24)
GFR, Estimated: >60 mL/min (ref >60)
Glucose, Bld: 92 mg/dL (ref 70–99)
Potassium: 3.5 mmol/L (ref 3.5–5.1)
Sodium: 143 mmol/L (ref 135–145)
Total Bilirubin: 0.5 mg/dL (ref 0.0–1.2)
Total Protein: 7.6 g/dL (ref 6.5–8.1)

## 2024-04-22 LAB — CBC
HCT: 37.3 % — ABNORMAL LOW (ref 39.0–52.0)
Hemoglobin: 11.5 g/dL — ABNORMAL LOW (ref 13.0–17.0)
MCH: 23 pg — ABNORMAL LOW (ref 26.0–34.0)
MCHC: 30.8 g/dL (ref 30.0–36.0)
MCV: 74.5 fL — ABNORMAL LOW (ref 80.0–100.0)
Platelets: 325 10*3/uL (ref 150–400)
RBC: 5.01 MIL/uL (ref 4.22–5.81)
RDW: 17.9 % — ABNORMAL HIGH (ref 11.5–15.5)
WBC: 5.9 10*3/uL (ref 4.0–10.5)
nRBC: 0 % (ref 0.0–0.2)

## 2024-04-22 LAB — OCCULT BLOOD X 1 CARD TO LAB, STOOL: Fecal Occult Bld: NEGATIVE

## 2024-04-22 MED ORDER — MORPHINE SULFATE (PF) 4 MG/ML IV SOLN
4.0000 mg | Freq: Once | INTRAVENOUS | Status: AC
  Administered 2024-04-22: 4 mg via INTRAVENOUS
  Filled 2024-04-22: qty 1

## 2024-04-22 MED ORDER — IOHEXOL 300 MG/ML  SOLN
100.0000 mL | Freq: Once | INTRAMUSCULAR | Status: AC | PRN
  Administered 2024-04-22: 100 mL via INTRAVENOUS

## 2024-04-22 MED ORDER — ONDANSETRON HCL 4 MG/2ML IJ SOLN: 4.0000 mg | Freq: Once | INTRAMUSCULAR | Status: DC | PRN

## 2024-04-22 NOTE — ED Provider Notes (Signed)
 [place] EMERGENCY DEPARTMENT AT [facility] Provider Note   CSN: [id number] Arrival date & time: 04/22/24  0944     History  Chief Complaint  Patient presents with   Abdominal Pain   Melena    Daniel Marsh is a 43 y.o. male who presents to the emergency department with a chief complaint of abdominal pain and bright red blood during bowel movements x 1 week.  Patient has past medical history significant for Crohn's and ulcerative colitis as well as internal and external hemorrhoids, hypertension, hyperlipidemia.  Patient states that he has been experiencing abdominal pain as well as increased blood in his stool for the past 1 week.  Patient states that during bowel movements there is bright red blood on the toilet paper as well as in the toilet bowl.  Patient states that at baseline with his ulcerative colitis and Crohn's disease there is bright red blood as well as dark stool in his bowel movements, states that his bowel movements fluctuate from diarrhea to solid and everything in between.  Patient sees GI specialist for ongoing diagnosis and treatment, states that last appointment was last year.  Patient states that he is due for a colonoscopy with his most recent one being about 2 years ago.  Denies fever, chills, chest pain, shortness of breath, vomiting.    Abdominal Pain Associated symptoms: constipation, diarrhea and nausea   Associated symptoms: no chest pain, no chills, no cough, no fever, no shortness of breath and no vomiting       Home Medications Prior to Admission medications   Medication Sig Start Date End Date Taking? Authorizing Provider  acetaminophen  (TYLENOL ) 500 MG tablet Take 1,500-2,000 mg by mouth every 6 (six) hours as needed (For foot pain).    [provider]  amitriptyline  (ELAVIL ) 150 MG tablet Take 1 tablet (150 mg total) by mouth at bedtime. 03/31/24   Arfeen, Bronson Canny, MD  atorvastatin  (LIPITOR) 20 MG tablet Take 1 tablet (20 mg  total) by mouth daily. 06/26/23   Alexander Iba, PA  diazepam  (VALIUM ) 5 MG tablet Take 1 tablet (5 mg total) by mouth at bedtime as needed for anxiety. 06/09/22   Alexander Iba, PA  dicyclomine  (BENTYL ) 10 MG capsule Take 1 capsule (10 mg total) by mouth 3 (three) times daily as needed (abdominal pain/ cramping). 10/17/21   Esterwood, Amy S, PA-C  mesalamine  (APRISO ) 0.375 g 24 hr capsule Take 1 capsule (0.375 g total) by mouth in the morning, at noon, in the evening, and at bedtime. 04/09/22   Esterwood, Amy S, PA-C  omeprazole  (PRILOSEC) 40 MG capsule Take 1 capsule (40 mg total) by mouth daily. 07/09/20   Esterwood, Amy S, PA-C  ondansetron  (ZOFRAN  ODT) 4 MG disintegrating tablet Take 1 tablet (4 mg total) by mouth every 8 (eight) hours as needed for nausea or vomiting. 09/25/21   Iva Mariner, MD  prazosin  (MINIPRESS ) 2 MG capsule Take 2 capsules (4 mg total) by mouth at bedtime. 06/23/23   Alexander Iba, PA  pregabalin  (LYRICA ) 75 MG capsule Take 1 capsule (75 mg total) by mouth 3 (three) times daily. 08/04/23   Jodi Munroe, NP  Vitamin D , Ergocalciferol , (DRISDOL ) 1.25 MG (50000 UNIT) CAPS capsule Take 1 capsule (50,000 Units total) by mouth every 7 (seven) days. 06/23/23   Alexander Iba, PA  vortioxetine  HBr (TRINTELLIX ) 20 MG TABS tablet Take 1 tablet (20 mg total) by mouth daily. 03/31/24   Arturo Late, MD  Allergies    Patient has no known allergies.    Review of Systems   Review of Systems  Constitutional:  Negative for chills and fever.  Respiratory:  Negative for cough, chest tightness and shortness of breath.   Cardiovascular:  Negative for chest pain.  Gastrointestinal:  Positive for abdominal pain, blood in stool, constipation, diarrhea, nausea and rectal pain. Negative for abdominal distention and vomiting.  Skin:  Negative for rash and wound.    Physical Exam Updated Vital Signs BP (!) 140/84   Pulse 80   Temp 98 F (36.7 C)   Resp 18   Ht 6' (1.829 m)    Wt 111.1 kg   SpO2 99%   BMI 33.23 kg/m  Physical Exam Vitals and nursing note reviewed.  Constitutional:      General: He is awake. He is not in acute distress.    Appearance: He is not toxic-appearing or diaphoretic.  HENT:     Head: Normocephalic and atraumatic.  Eyes:     General: No scleral icterus.    Extraocular Movements: Extraocular movements intact.  Cardiovascular:     Rate and Rhythm: Normal rate and regular rhythm.  Pulmonary:     Effort: Pulmonary effort is normal. No respiratory distress.     Breath sounds: No wheezing, rhonchi or rales.  Abdominal:     General: There is no distension.     Palpations: Abdomen is soft.     Tenderness: There is generalized abdominal tenderness and tenderness in the left upper quadrant. There is guarding.  Skin:    Capillary Refill: Capillary refill takes less than 2 seconds.  Neurological:     General: No focal deficit present.     Mental Status: He is alert and oriented to person, place, and time.     Motor: No weakness.  Psychiatric:        Mood and Affect: Mood normal.        Behavior: Behavior normal. Behavior is cooperative.     ED Results / Procedures / Treatments   Labs (all labs ordered are listed, but only abnormal results are displayed) Labs Reviewed  CBC - Abnormal; Notable for the following components:      Result Value   Hemoglobin 11.5 (*)    HCT 37.3 (*)    MCV 74.5 (*)    MCH 23.0 (*)    RDW 17.9 (*)    All other components within normal limits  URINALYSIS, ROUTINE W REFLEX MICROSCOPIC - Abnormal; Notable for the following components:   Color, Urine COLORLESS (*)    All other components within normal limits  LIPASE, BLOOD  COMPREHENSIVE METABOLIC PANEL WITH GFR  OCCULT BLOOD X 1 CARD TO LAB, STOOL  POC OCCULT BLOOD, ED    EKG None  Radiology CT ABDOMEN PELVIS W CONTRAST Result Date: 04/22/2024 CLINICAL DATA:  LUQ abdominal pain EXAM: CT ABDOMEN AND PELVIS WITH CONTRAST TECHNIQUE: Multidetector  CT imaging of the abdomen and pelvis was performed using the standard protocol following bolus administration of intravenous contrast. RADIATION DOSE REDUCTION: This exam was performed according to the departmental dose-optimization program which includes automated exposure control, adjustment of the mA and/or kV according to patient size and/or use of iterative reconstruction technique. CONTRAST:  OMNIPAQUE  IOHEXOL  300 MG/ML  SOLN COMPARISON:  September 25, 2021 FINDINGS: Lower chest: No focal airspace consolidation or pleural effusion. Hepatobiliary: Unchanged 1.8 cm hypodensity in the right hepatic lobe, likely a hemangioma. A couple of additional subcentimeter hypodensities are  noted in the liver, too small to definitively characterize, possibly small cysts or biliary hamartomas.Focal fatty infiltration along the falciform ligament.no radiopaque stones or wall thickening of the gallbladder. No intrahepatic or extrahepatic biliary ductal dilation. The portal veins are patent. Pancreas: No mass or main ductal dilation. No peripancreatic inflammation or fluid collection. Spleen: Normal size. No mass. Adrenals/Urinary Tract: No adrenal masses. No renal mass. Fluid-filled extrarenal pelvis on the left. No nephrolithiasis or hydronephrosis. The urinary bladder is distended without focal abnormality. Stomach/Bowel: The stomach contains ingested material without focal abnormality. No small bowel obstruction. Mild wall thickening of the proximal jejunum in the left upper quadrant (for example, axial 23).Normal appendix. Scattered colonic diverticulosis. No changes of acute diverticulitis. Vascular/Lymphatic: No aortic aneurysm. No intraabdominal or pelvic lymphadenopathy. Reproductive: No prostatomegaly.No free pelvic fluid. Other: No pneumoperitoneum, ascites, or mesenteric inflammation. Musculoskeletal: No acute fracture or destructive lesion. L4-L5 fusion hardware. IMPRESSION: Mild wall thickening of the proximal  jejunum in the left upper quadrant, which may be artifactual due to underdistention. Alternatively, it could represent changes of an infectious or inflammatory jejunitis. No small bowel obstruction. Electronically Signed   By: Rance Burrows M.D.   On: 04/22/2024 14:45    Procedures Procedures    Medications Ordered in ED Medications  ondansetron  (ZOFRAN ) injection 4 mg (has no administration in time range)  iohexol  (OMNIPAQUE ) 300 MG/ML solution 100 mL (100 mLs Intravenous Contrast Given 04/22/24 1135)  morphine (PF) 4 MG/ML injection 4 mg (4 mg Intravenous Given 04/22/24 1149)  morphine (PF) 4 MG/ML injection 4 mg (4 mg Intravenous Given 04/22/24 1457)    ED Course/ Medical Decision Making/ A&P    Patient presents to the ED for concern of abdominal pain, blood in stool, this involves an extensive number of treatment options, and is a complaint that carries with it a high risk of complications and morbidity.  The differential diagnosis includes ulcerative colitis, Crohn's disease, diverticulitis, pancreatitis, appendicitis, cholecystitis, anal fissure, etc.    Co morbidities that complicate the patient evaluation  Ulcerative colitis, Crohn's disease, internal/external hemorrhoids   Additional history obtained:  External records from outside source obtained and reviewed including emergency department note from 09/25/2021 where patient was evaluated for rectal bleeding.   Lab Tests:  I Ordered, and personally interpreted labs.  The pertinent results include: CBC - hemoglobin 11.5 UA-unremarkable CMP-unremarkable Lipase 33 Hemoccult-negative, question validity of my sample, limited stool achieved on rectal exam   Imaging Studies ordered:  I ordered imaging studies including CT abdomen pelvis with contrast I independently visualized and interpreted imaging which showed:  IMPRESSION:  Mild wall thickening of the proximal jejunum in the left upper  quadrant, which may be  artifactual due to underdistention.  Alternatively, it could represent changes of an infectious or  inflammatory jejunitis. No small bowel obstruction.   I agree with the radiologist interpretation   Cardiac Monitoring:  The patient was maintained on a cardiac monitor.  I personally viewed and interpreted the cardiac monitored which showed an underlying rhythm of: sinus   Medicines ordered and prescription drug management:  I ordered medication including Zofran  for nausea, morphine for pain Reevaluation of the patient after these medicines showed that the patient improved I have reviewed the patients home medicines and have made adjustments as needed  Critical Interventions:  None  Problem List / ED Course:  Abdominal Pain, blood in stool Pertinent history of UC, Crohn's, external/internal hemorrhoids, fistula Lab workup today largely unremarkable, hemoglobin 11.5 which is similar to previous  labs 10 months ago. UA unremarkable, lipase unremarkable, CT abdomen pelvis with contrast ordered CT abdomen pelvis significant for mild wall thickening of the proximal jejunum in the left upper quadrant, may represent infectious or inflammatory jejunitis, patient has a known history of Crohn's disease as well as ulcerative colitis.  Patient has no symptoms of infection other than abdominal pain, blood in stool.  No fever, no chills, no elevated white count. Based on history, physical exam, labs, and imaging today patient instructed to have close follow-up with GI specialist for ongoing diagnosis and treatment.  Recommend GI follow-up within 48 hours. Return precautions given Patient discharged  Reevaluation:  After the interventions noted above, I reevaluated the patient and found that they have :improved   Social Determinants of Health:  None   Dispostion:  After consideration of the diagnostic results and the patients response to treatment, I feel that the patent would benefit  from urgent and close follow-up with the GI specialist who is already established, preferably within 48 hours.  Continue to follow-up with primary care and specialist as scheduled otherwise.  Click here for ABCD2, HEART and other calculatorsREFRESH Note before signing :1}                              Medical Decision Making Amount and/or Complexity of Data Reviewed Labs: ordered. Radiology: ordered.  Risk Prescription drug management.         Final Clinical Impression(s) / ED Diagnoses Final diagnoses:  LUQ abdominal pain    Rx / DC Orders ED Discharge Orders     None         Susanne Epps 04/22/24 1915    Auston Blush, MD 04/26/24 [phone]

## 2024-04-22 NOTE — ED Triage Notes (Signed)
 Pt POV from home c/o abd pain and bright red blood during bowel movements x 1 week. Pt w/ hx of crohn's and ulcerative colitis.

## 2024-04-22 NOTE — Discharge Instructions (Addendum)
 It was a pleasure taking care of you today.  Today we evaluated the cause of your left upper quadrant abdominal pain.  Based on your history, physical exam, labs, and imaging the most likely diagnosis is that your pain is related to your previous GI diagnosis of ulcerative colitis and Crohn's disease.  I recommend close follow-up with your established GI specialist, preferably within 48 hours.  If you experience any of the following symptoms including but not limited to fever, chills, shortness of breath, excessive nausea/vomiting, excessive blood in stool, severe abdominal pain, please return to the emergency department or seek further medical care.

## 2024-04-27 ENCOUNTER — Telehealth (HOSPITAL_BASED_OUTPATIENT_CLINIC_OR_DEPARTMENT_OTHER): Admitting: Psychiatry

## 2024-04-27 ENCOUNTER — Encounter (HOSPITAL_COMMUNITY): Payer: Self-pay | Admitting: Psychiatry

## 2024-04-27 VITALS — Wt 245.0 lb

## 2024-04-27 DIAGNOSIS — F331 Major depressive disorder, recurrent, moderate: Secondary | ICD-10-CM | POA: Diagnosis not present

## 2024-04-27 DIAGNOSIS — F4310 Post-traumatic stress disorder, unspecified: Secondary | ICD-10-CM | POA: Diagnosis not present

## 2024-04-27 MED ORDER — AMITRIPTYLINE HCL 150 MG PO TABS: 150.0000 mg | ORAL_TABLET | Freq: Every day | ORAL | 2 refills | Status: DC

## 2024-04-27 MED ORDER — VORTIOXETINE HBR 20 MG PO TABS: 20.0000 mg | ORAL_TABLET | Freq: Every day | ORAL | 2 refills | Status: DC

## 2024-04-27 NOTE — Progress Notes (Signed)
 [facility] MD Virtual Progress Note   Patient Location: Home Provider Location: Home Office  I connect with patient by video and verified that I am speaking with correct person by using two identifiers. I discussed the limitations of evaluation and management by telemedicine and the availability of in person appointments. I also discussed with the patient that there may be a patient responsible charge related to this service. The patient expressed understanding and agreed to proceed.  [name]-Bristol Hamad [id number] 43 y.o.  04/27/2024 9:19 AM  History of Present Illness:  Patient is evaluated by video session.  He told he was in the emergency room because of abdominal pain and blood in the stool.  Patient not trying to schedule with GI.  He still have nausea and abdominal pain.  Started him on amitriptyline  and Lamictal as of the last visit as patient was without the medication and feeling very sad depressed isolated and withdrawn.  He noticed improvement in his symptoms.  He is sleeping at least 6 hours and denies any crying spells or any feeling of hopelessness or worthlessness.  Patient lives with his wife and 27-year-old and 47 year old and 49-year-old.  He denies any irritability, mania, anger.  He reported mood is much better and is handling the stress better than he anticipated.  Denies any nightmares or flashbacks.  He has no tremors shakes or any EPS.  Patient is taking Minipress , Valium , Lyrica  from primary care.  He denies any suicidal thoughts.  He reported medicine is helping.  He also started therapy with Ms. Boston Byers.  Past Psychiatric History:  H/O depression, PTSD and anxiety. 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 Tried Prozac and Wellbutrin but did not like, Zoloft caused sexual side effects and Cymbalta worked for a while. H/O group therapy.  No history of suicidal attempt or inpatient treatment. H/O DUI and incarceration for 9 months in military for the charges  of sexual assault and indecent exposure.   Outpatient Encounter Medications as of 04/27/2024  Medication Sig   acetaminophen  (TYLENOL ) 500 MG tablet Take 1,500-2,000 mg by mouth every 6 (six) hours as needed (For foot pain).   amitriptyline  (ELAVIL ) 150 MG tablet Take 1 tablet (150 mg total) by mouth at bedtime.   atorvastatin  (LIPITOR) 20 MG tablet Take 1 tablet (20 mg total) by mouth daily.   diazepam  (VALIUM ) 5 MG tablet Take 1 tablet (5 mg total) by mouth at bedtime as needed for anxiety.   dicyclomine  (BENTYL ) 10 MG capsule Take 1 capsule (10 mg total) by mouth 3 (three) times daily as needed (abdominal pain/ cramping).   mesalamine  (APRISO ) 0.375 g 24 hr capsule Take 1 capsule (0.375 g total) by mouth in the morning, at noon, in the evening, and at bedtime.   omeprazole  (PRILOSEC) 40 MG capsule Take 1 capsule (40 mg total) by mouth daily.   ondansetron  (ZOFRAN  ODT) 4 MG disintegrating tablet Take 1 tablet (4 mg total) by mouth every 8 (eight) hours as needed for nausea or vomiting.   prazosin  (MINIPRESS ) 2 MG capsule Take 2 capsules (4 mg total) by mouth at bedtime.   pregabalin  (LYRICA ) 75 MG capsule Take 1 capsule (75 mg total) by mouth 3 (three) times daily.   Vitamin D , Ergocalciferol , (DRISDOL ) 1.25 MG (50000 UNIT) CAPS capsule Take 1 capsule (50,000 Units total) by mouth every 7 (seven) days.   vortioxetine  HBr (TRINTELLIX ) 20 MG TABS tablet Take 1 tablet (20 mg total) by mouth daily.   No facility-administered encounter medications on  file as of 04/27/2024.    Recent Results (from the past 2160 hours)  Lipase, blood     Status: None   Collection Time: 04/22/24 10:28 AM  Result Value Ref Range   Lipase 33 11 - 51 U/L    Comment: Performed at [facility], [address] [place], [place] [address]  Comprehensive metabolic panel     Status: None   Collection Time: 04/22/24 10:28 AM  Result Value Ref Range   Sodium 143 135 - 145 mmol/L   Potassium 3.5 3.5 -  5.1 mmol/L   Chloride 105 98 - 111 mmol/L   CO2 24 22 - 32 mmol/L   Glucose, Bld 92 70 - 99 mg/dL    Comment: Glucose reference range applies only to samples taken after fasting for at least 8 hours.   BUN 8 6 - 20 mg/dL   Creatinine, Ser 6.44 0.61 - 1.24 mg/dL   Calcium  9.6 8.9 - 10.3 mg/dL   Total Protein 7.6 6.5 - 8.1 g/dL   Albumin 4.7 3.5 - 5.0 g/dL   AST 22 15 - 41 U/L   ALT 15 0 - 44 U/L   Alkaline Phosphatase 57 38 - 126 U/L   Total Bilirubin 0.5 0.0 - 1.2 mg/dL   GFR, Estimated >03 >47 mL/min    Comment: (NOTE) Calculated using the CKD-EPI Creatinine Equation (2021)    Anion gap 14 5 - 15    Comment: Performed at [facility], [address], [place], [place] [address]  CBC     Status: Abnormal   Collection Time: 04/22/24 10:28 AM  Result Value Ref Range   WBC 5.9 4.0 - 10.5 K/uL   RBC 5.01 4.22 - 5.81 MIL/uL   Hemoglobin 11.5 (L) 13.0 - 17.0 g/dL   HCT 63.8 (L) 75.6 - 43.3 %   MCV 74.5 (L) 80.0 - 100.0 fL   MCH 23.0 (L) 26.0 - 34.0 pg   MCHC 30.8 30.0 - 36.0 g/dL   RDW 29.5 (H) 18.8 - 41.6 %   Platelets 325 150 - 400 K/uL   nRBC 0.0 0.0 - 0.2 %    Comment: Performed at [facility], [address] [place], [place] [address]  Urinalysis, Routine w reflex microscopic -Urine, Clean Catch     Status: Abnormal   Collection Time: 04/22/24 10:28 AM  Result Value Ref Range   Color, Urine COLORLESS (A) YELLOW   APPearance CLEAR CLEAR   Specific Gravity, Urine 1.009 1.005 - 1.030   pH 6.5 5.0 - 8.0   Glucose, UA NEGATIVE NEGATIVE mg/dL   Hgb urine dipstick NEGATIVE NEGATIVE   Bilirubin Urine NEGATIVE NEGATIVE   Ketones, ur NEGATIVE NEGATIVE mg/dL   Protein, ur NEGATIVE NEGATIVE mg/dL   Nitrite NEGATIVE NEGATIVE   Leukocytes,Ua NEGATIVE NEGATIVE    Comment: Performed at [facility], [address] [place], [place], [place] [address]  Occult blood card to lab, stool     Status: None   Collection Time: 04/22/24 10:38  AM  Result Value Ref Range   Fecal Occult Bld NEGATIVE NEGATIVE    Comment: Performed at [facility], [address] [place], [place] [address]     Psychiatric Specialty Exam: Physical Exam  Review of Systems  Gastrointestinal:  Positive for abdominal pain and nausea.    Weight 245 lb (111.1 kg).There is no height or weight on file to calculate BMI.  General Appearance: Casual  Eye Contact:  Good  Speech:  Clear  and Coherent and Normal Rate  Volume:  Normal  Mood:  Euthymic  Affect:  Appropriate  Thought Process:  Goal Directed  Orientation:  Full (Time, Place, and Person)  Thought Content:  Rumination  Suicidal Thoughts:  No  Homicidal Thoughts:  No  Memory:  Immediate;   Good Recent;   Fair Remote;   Good  Judgement:  Intact  Insight:  Present  Psychomotor Activity:  Decreased  Concentration:  Concentration: Good and Attention Span: Good  Recall:  Good  Fund of Knowledge:  Good  Language:  Good  Akathisia:  No  Handed:  Right  AIMS (if indicated):     Assets:  Communication Skills Desire for Improvement Housing Transportation  ADL's:  Intact  Cognition:  WNL  Sleep:  better       03/30/2024    8:57 AM 09/15/2023    3:15 PM 09/15/2023    3:01 PM 06/23/2023    1:03 PM 02/06/2023   10:00 AM  Depression screen PHQ 2/9  Decreased Interest 3 1 0 2 2  Down, Depressed, Hopeless 2 1 0 2 2  PHQ - 2 Score 5 2 0 4 4  Altered sleeping 3   2   Tired, decreased energy 3   2   Change in appetite 2   2   Feeling bad or failure about yourself  2   2   Trouble concentrating 1   2   Moving slowly or fidgety/restless 0   1   Suicidal thoughts 1   1   PHQ-9 Score 17   16   Difficult doing work/chores    Somewhat difficult     Assessment/Plan: PTSD (post-traumatic stress disorder) - Plan: amitriptyline  (ELAVIL ) 150 MG tablet, vortioxetine  HBr (TRINTELLIX ) 20 MG TABS tablet  MDD (major depressive disorder), recurrent episode, moderate (HCC) - Plan:  amitriptyline  (ELAVIL ) 150 MG tablet, vortioxetine  HBr (TRINTELLIX ) 20 MG TABS tablet  I reviewed blood work results.  Has low hemoglobin and hematocrit.  He is going to see his GI for his abdominal pain.  He feels the current medicine is helping and he is no longer having severe depression.  He tried to go outside if he does not have pain.  He also started therapy with Ms. Hilario Lover.  Continue amitriptyline  150 mg at bedtime and Trintellix  20 mg daily.  He is also on Minipress , Lyrica  and Valium  prescribed by PCP.  Discussed medication side effects and benefits.  Recommend to call us  back if is any question or any concern.  Follow-up in 3 months.  Encouraged to continue therapy with Ms. Boston Byers.   Follow Up Instructions:     I discussed the assessment and treatment plan with the patient. The patient was provided an opportunity to ask questions and all were answered. The patient agreed with the plan and demonstrated an understanding of the instructions.   The patient was advised to call back or seek an in-person evaluation if the symptoms worsen or if the condition fails to improve as anticipated.    Collaboration of Care: Other provider involved in patient's care AEB notes are available in epic to review  Patient/Guardian was advised Release of Information must be obtained prior to any record release in order to collaborate their care with an outside provider. Patient/Guardian was advised if they have not already done so to contact the registration department to sign all necessary forms in order for us  to release information regarding their care.   Consent: Patient/Guardian gives verbal  consent for treatment and assignment of benefits for services provided during this visit. Patient/Guardian expressed understanding and agreed to proceed.     Total encounter time 19 minutes which includes face-to-face time, chart reviewed, care coordination, order entry and documentation during this encounter.    Note: This document was prepared by [employer] voice dictation technology and any errors that results from this process are unintentional.    Arturo Late, MD 04/27/2024

## 2024-05-04 ENCOUNTER — Ambulatory Visit (INDEPENDENT_AMBULATORY_CARE_PROVIDER_SITE_OTHER): Admitting: Clinical

## 2024-05-04 ENCOUNTER — Encounter (HOSPITAL_COMMUNITY): Payer: Self-pay | Admitting: Clinical

## 2024-05-04 DIAGNOSIS — F1010 Alcohol abuse, uncomplicated: Secondary | ICD-10-CM

## 2024-05-04 DIAGNOSIS — F1210 Cannabis abuse, uncomplicated: Secondary | ICD-10-CM | POA: Diagnosis not present

## 2024-05-04 DIAGNOSIS — F331 Major depressive disorder, recurrent, moderate: Secondary | ICD-10-CM

## 2024-05-04 DIAGNOSIS — F4310 Post-traumatic stress disorder, unspecified: Secondary | ICD-10-CM
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
## 2024-06-01 ENCOUNTER — Encounter (HOSPITAL_COMMUNITY): Payer: Self-pay | Admitting: Clinical

## 2024-06-01 ENCOUNTER — Ambulatory Visit (INDEPENDENT_AMBULATORY_CARE_PROVIDER_SITE_OTHER): Admitting: Clinical

## 2024-06-01 DIAGNOSIS — F4310 Post-traumatic stress disorder, unspecified: Secondary | ICD-10-CM | POA: Diagnosis not present

## 2024-06-01 DIAGNOSIS — F331 Major depressive disorder, recurrent, moderate: Secondary | ICD-10-CM

## 2024-06-01 DIAGNOSIS — F1010 Alcohol abuse, uncomplicated: Secondary | ICD-10-CM

## 2024-06-01 DIAGNOSIS — F1210 Cannabis abuse, uncomplicated: Secondary | ICD-10-CM
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
## 2024-06-14 ENCOUNTER — Ambulatory Visit (INDEPENDENT_AMBULATORY_CARE_PROVIDER_SITE_OTHER): Admitting: Clinical

## 2024-06-14 ENCOUNTER — Encounter (HOSPITAL_COMMUNITY): Payer: Self-pay | Admitting: Clinical

## 2024-06-14 DIAGNOSIS — F1210 Cannabis abuse, uncomplicated: Secondary | ICD-10-CM | POA: Diagnosis not present

## 2024-06-14 DIAGNOSIS — F4310 Post-traumatic stress disorder, unspecified: Secondary | ICD-10-CM | POA: Diagnosis not present

## 2024-06-14 DIAGNOSIS — F1010 Alcohol abuse, uncomplicated: Secondary | ICD-10-CM

## 2024-06-14 DIAGNOSIS — F331 Major depressive disorder, recurrent, moderate: Secondary | ICD-10-CM | POA: Diagnosis not present
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
## 2024-06-16 ENCOUNTER — Ambulatory Visit

## 2024-06-23 ENCOUNTER — Ambulatory Visit

## 2024-06-23 ENCOUNTER — Telehealth: Payer: Self-pay | Admitting: Physician Assistant

## 2024-06-23 VITALS — Ht 72.0 in | Wt 247.0 lb

## 2024-06-23 DIAGNOSIS — Z0000 Encounter for general adult medical examination without abnormal findings: Secondary | ICD-10-CM | POA: Diagnosis not present

## 2024-06-23 NOTE — Telephone Encounter (Unsigned)
 Copied from CRM [id number]. Topic: General - Other >> Jun 23, 2024 10:18 AM Gennette ORN wrote: Reason for CRM: Patient is trying to schedule a physical but it's only showing 2026. His wife is trying to schedule the physical and also get FMLA paperwork done as well. She is needing the paperwork filled out as soon as possible.

## 2024-06-23 NOTE — Patient Instructions (Signed)
 Daniel Marsh , Thank you for taking time out of your busy schedule to complete your Annual Wellness Visit with me. I enjoyed our conversation and look forward to speaking with you again next year. I, as well as your care team,  appreciate your ongoing commitment to your health goals. Please review the following plan we discussed and let me know if I can assist you in the future. Your Game plan/ To Do List    Referrals: If you haven't heard from the office you've been referred to, please reach out to them at the phone provided.   Follow up Visits: Next Medicare AWV with our clinical staff: 06/28/25   Have you seen your provider in the last 6 months (3 months if uncontrolled diabetes)? No Next Office Visit with your provider: pt will make follow up appt   Clinician Recommendations:  Aim for 30 minutes of exercise or brisk walking, 6-8 glasses of water, and 5 servings of fruits and vegetables each day.       This is a list of the screening recommended for you and due dates:  Health Maintenance  Topic Date Due   HPV Vaccine (1 - 3-dose SCDM series) Never done   Flu Shot  07/01/2024   Medicare Annual Wellness Visit  06/23/2025   DTaP/Tdap/Td vaccine (3 - Td or Tdap) 05/14/2027   Hepatitis B Vaccine  Completed   Hepatitis C Screening  Completed   HIV Screening  Completed   Meningitis B Vaccine  Aged Out   Pneumococcal Vaccination  Discontinued   COVID-19 Vaccine  Discontinued    Advanced directives: (Declined) Advance directive discussed with you today. Even though you declined this today, please call our office should you change your mind, and we can give you the proper paperwork for you to fill out. Advance Care Planning is important because it:  [x]  Makes sure you receive the medical care that is consistent with your values, goals, and preferences  [x]  It provides guidance to your family and loved ones and reduces their decisional burden about whether or not they are making the right  decisions based on your wishes.  Follow the link provided in your after visit summary or read over the paperwork we have mailed to you to help you started getting your Advance Directives in place. If you need assistance in completing these, please reach out to us  so that we can help you!  See attachments for Preventive Care and Fall Prevention Tips.

## 2024-06-23 NOTE — Progress Notes (Signed)
 Subjective:   Daniel Marsh is a 43 y.o. who presents for a Medicare Wellness preventive visit.  As a reminder, Annual Wellness Visits don't include a physical exam, and some assessments may be limited, especially if this visit is performed virtually. We may recommend an in-person follow-up visit with your provider if needed.  Visit Complete: Virtual I connected with  Daniel Pieri on 06/23/24 by a audio enabled telemedicine application and verified that I am speaking with the correct person using two identifiers.  Patient Location: Home  Provider Location: Home Office  I discussed the limitations of evaluation and management by telemedicine. The patient expressed understanding and agreed to proceed.  Vital Signs: Because this visit was a virtual/telehealth visit, some criteria may be missing or patient reported. Any vitals not documented were not able to be obtained and vitals that have been documented are patient reported.  VideoDeclined- This patient declined Librarian, academic. Therefore the visit was completed with audio only.  Persons Participating in Visit: Patient.  AWV Questionnaire: No: Patient Medicare AWV questionnaire was not completed prior to this visit.  Cardiac Risk Factors include: advanced age (>65men, >65 women);male gender;obesity (BMI >30kg/m2);hypertension;dyslipidemia;smoking/ tobacco exposure     Objective:    Today's Vitals   06/23/24 0933 06/23/24 0934  Weight: 247 lb (112 kg)   Height: 6' (1.829 m)   PainSc:  7    Body mass index is 33.5 kg/m.     06/23/2024    9:39 AM 04/22/2024   10:20 AM 06/14/2022   10:40 PM 06/10/2022    3:04 PM 09/25/2021    4:03 PM 02/21/2021    3:02 PM 04/13/2020    3:10 AM  Advanced Directives  Does Patient Have a Medical Advance Directive? No No No No No No No  Would patient like information on creating a medical advance directive? No - Patient declined  No - Patient declined   No - Patient declined -- No - Patient declined    Current Medications (verified) Outpatient Encounter Medications as of 06/23/2024  Medication Sig   acetaminophen  (TYLENOL ) 500 MG tablet Take 1,500-2,000 mg by mouth every 6 (six) hours as needed (For foot pain).   amitriptyline  (ELAVIL ) 150 MG tablet Take 1 tablet (150 mg total) by mouth at bedtime.   atorvastatin  (LIPITOR) 20 MG tablet Take 1 tablet (20 mg total) by mouth daily.   diazepam  (VALIUM ) 5 MG tablet Take 1 tablet (5 mg total) by mouth at bedtime as needed for anxiety.   dicyclomine  (BENTYL ) 10 MG capsule Take 1 capsule (10 mg total) by mouth 3 (three) times daily as needed (abdominal pain/ cramping).   mesalamine  (APRISO ) 0.375 g 24 hr capsule Take 1 capsule (0.375 g total) by mouth in the morning, at noon, in the evening, and at bedtime.   omeprazole  (PRILOSEC) 40 MG capsule Take 1 capsule (40 mg total) by mouth daily.   ondansetron  (ZOFRAN  ODT) 4 MG disintegrating tablet Take 1 tablet (4 mg total) by mouth every 8 (eight) hours as needed for nausea or vomiting.   prazosin  (MINIPRESS ) 2 MG capsule Take 2 capsules (4 mg total) by mouth at bedtime.   pregabalin  (LYRICA ) 75 MG capsule Take 1 capsule (75 mg total) by mouth 3 (three) times daily.   Vitamin D , Ergocalciferol , (DRISDOL ) 1.25 MG (50000 UNIT) CAPS capsule Take 1 capsule (50,000 Units total) by mouth every 7 (seven) days.   vortioxetine  HBr (TRINTELLIX ) 20 MG TABS tablet Take 1 tablet (20 mg total)  by mouth daily.   No facility-administered encounter medications on file as of 06/23/2024.    Allergies (verified) Patient has no known allergies.   History: Past Medical History:  Diagnosis Date   Anemia    Anxiety    Arthritis    Depression    Hypertension    Liver disease    Myocardial infarction (HCC)    PTSD (post-traumatic stress disorder)    Ulcerative colitis (HCC) 2019   Past Surgical History:  Procedure Laterality Date   ANKLE SURGERY Right 2011   KNEE  SURGERY Left 2020   SPINE SURGERY  2013   Lumbar fusion L4-S1   WISDOM TOOTH EXTRACTION     Family History  Problem Relation Age of Onset   Osteoarthritis Mother    Depression Mother    Heart attack Mother 60   Osteoarthritis Father    Depression Father    Bladder Cancer Father        in remission   Prostate cancer Father    Depression Brother    Depression Brother    Diabetes Maternal Grandmother    Hyperlipidemia Maternal Grandmother    Hypertension Maternal Grandmother    Heart attack Maternal Grandmother    Osteoarthritis Maternal Grandfather    COPD Maternal Grandfather    Kidney disease Maternal Grandfather    Renal cancer Maternal Grandfather    Alcohol abuse Paternal Grandmother    COPD Paternal Grandmother    Diabetes Paternal Grandmother    Hypertension Paternal Grandmother    Lung cancer Paternal Grandmother    Osteoarthritis Paternal Grandfather    Heart attack Paternal Grandfather    Heart failure Paternal Grandfather    Pancreatic cancer Maternal Aunt    Colon cancer Maternal Uncle    Lung cancer Paternal Aunt    Social History   Socioeconomic History   Marital status: Married    Spouse name: Not on file   Number of children: 3   Years of education: Not on file   Highest education level: Not on file  Occupational History   Occupation: Disabled  Tobacco Use   Smoking status: Every Day    Current packs/day: 0.25    Average packs/day: 0.3 packs/day for 15.0 years (3.8 ttl pk-yrs)    Types: Cigarettes, Cigars   Smokeless tobacco: Current    Types: Chew   Tobacco comments:    I am usually a social smoker and he dips occ  Vaping Use   Vaping status: Never Used  Substance and Sexual Activity   Alcohol use: Yes    Alcohol/week: 12.0 standard drinks of alcohol    Types: 12 Cans of beer per week   Drug use: Yes    Types: Marijuana    Comment: Was using to self medicate for depression and anxiety   Sexual activity: Yes    Partners: Female     Birth control/protection: I.U.D.    Comment: My wife currently has the [facility] IUD  Other Topics Concern   Not on file  Social History Narrative   ** Merged History Encounter **       Was in the [employer] for 7 years, required lumbar surgery while in the [employer] and had poor outcome Divorced and re-married 2 yo son    Social Drivers of Corporate investment banker Strain: Low Risk  (06/23/2024)   Overall Financial Resource Strain (CARDIA)    Difficulty of Paying Living Expenses: Not hard at all  Food Insecurity: No Food Insecurity (06/23/2024)  Hunger Vital Sign    Worried About Running Out of Food in the Last Year: Never true    Ran Out of Food in the Last Year: Never true  Transportation Needs: No Transportation Needs (06/23/2024)   PRAPARE - Administrator, Civil Service (Medical): No    Lack of Transportation (Non-Medical): No  Physical Activity: Sufficiently Active (06/23/2024)   Exercise Vital Sign    Days of Exercise per Week: 5 days    Minutes of Exercise per Session: 30 min  Stress: No Stress Concern Present (06/23/2024)   [employer] of Occupational Health - Occupational Stress Questionnaire    Feeling of Stress: Not at all  Social Connections: Moderately Isolated (06/23/2024)   Social Connection and Isolation Panel    Frequency of Communication with Friends and Family: More than three times a week    Frequency of Social Gatherings with Friends and Family: More than three times a week    Attends Religious Services: Never    Database administrator or Organizations: No    Attends Engineer, structural: Never    Marital Status: Married    Tobacco Counseling Ready to quit: Not Answered Counseling given: Not Answered Tobacco comments: I am usually a social smoker and he dips occ    Clinical Intake:  Pre-visit preparation completed: Yes  Pain : 0-10 Pain Score: 7  Pain Type: Chronic pain Pain Location: Generalized (stomach knees and  back) Pain Descriptors / Indicators: Aching, Sore, Burning Pain Onset: More than a month ago Pain Frequency: Constant     BMI - recorded: 33.5 Nutritional Status: BMI > 30  Obese Nutritional Risks: None Diabetes: No  Lab Results  Component Value Date   HGBA1C 5.1 12/30/2021   HGBA1C 5.2 11/21/2020     How often do you need to have someone help you when you read instructions, pamphlets, or other written materials from your doctor or pharmacy?: 1 - Never  Interpreter Needed?: No  Information entered by :: Ellouise Haws, LPN   Activities of Daily Living     06/23/2024    9:36 AM  In your present state of health, do you have any difficulty performing the following activities:  Hearing? 0  Vision? 0  Difficulty concentrating or making decisions? 0  Walking or climbing stairs? 1  Comment difficulty  Dressing or bathing? 0  Doing errands, shopping? 0  Preparing Food and eating ? N  Using the Toilet? N  In the past six months, have you accidently leaked urine? N  Do you have problems with loss of bowel control? N  Managing your Medications? N  Managing your Finances? N  Housekeeping or managing your Housekeeping? N    Patient Care Team: Job Lukes, [place] as PCP - General (Physician Assistant)  I have updated your Care Teams any recent Medical Services you may have received from other providers in the past year.     Assessment:   This is a routine wellness examination for Daniel.  Hearing/Vision screen Hearing Screening - Comments:: Pt denies any hearing issues  Vision Screening - Comments:: Encouraged to follow up with an eye provider    Goals Addressed             This Visit's Progress    Patient Stated       Weight loss       Depression Screen     06/23/2024    9:39 AM 03/30/2024    8:57 AM 09/15/2023  3:15 PM 09/15/2023    3:01 PM 06/23/2023    1:03 PM 02/06/2023   10:00 AM 02/05/2023   12:23 PM  PHQ 2/9 Scores  PHQ - 2 Score 1  2 0 4 4    PHQ- 9 Score     16       Information is confidential and restricted. Go to Review Flowsheets to unlock data.    Fall Risk     06/23/2024    9:41 AM 09/15/2023    3:15 PM 09/15/2023    3:01 PM 08/04/2023    8:51 AM 02/06/2023   10:01 AM  Fall Risk   Falls in the past year? 1 0 0 1 1  Number falls in past yr: 1 0  1 1  Injury with Fall? 0 0  0 1  Risk for fall due to : Impaired balance/gait;Impaired mobility;History of fall(s)      Follow up Falls prevention discussed        MEDICARE RISK AT HOME:  Medicare Risk at Home Any stairs in or around the home?: Yes If so, are there any without handrails?: No Home free of loose throw rugs in walkways, pet beds, electrical cords, etc?: Yes Adequate lighting in your home to reduce risk of falls?: Yes Life alert?: No Use of a cane, walker or w/c?: Yes Grab bars in the bathroom?: No Shower chair or bench in shower?: Yes Elevated toilet seat or a handicapped toilet?: No  TIMED UP AND GO:  Was the test performed?  No  Cognitive Function: 6CIT completed        06/23/2024    9:42 AM  6CIT Screen  What Year? 0 points  What month? 0 points  What time? 0 points  Count back from 20 0 points  Months in reverse 0 points  Repeat phrase 0 points  Total Score 0 points    Immunizations Immunization History  Administered Date(s) Administered   Hepatitis B, ADULT 08/05/2008, 09/20/2008, 03/09/2009   IPV 08/05/2008   Influenza Nasal 08/24/2009, 10/22/2010, 08/21/2011   Influenza, Seasonal, Injecte, Preservative Fre 08/08/2010   Influenza,Quad,Nasal, Live 09/02/2012, 10/05/2013   Influenza,inj,Quad PF,6+ Mos 10/08/2020, 10/07/2021   Influenza-Unspecified 12/01/2019   Meningococcal Conjugate 08/05/2008   Novel Infuenza-h1n1-09 11/13/2008   PFIZER(Purple Top)SARS-COV-2 Vaccination 03/01/2020, 03/26/2020   PPD Test 08/05/2008   Tdap 08/05/2008, 05/13/2017    Screening Tests Health Maintenance  Topic Date Due   HPV VACCINES (1 -  3-dose SCDM series) Never done   INFLUENZA VACCINE  07/01/2024   Medicare Annual Wellness (AWV)  06/23/2025   DTaP/Tdap/Td (3 - Td or Tdap) 05/14/2027   Hepatitis B Vaccines  Completed   Hepatitis C Screening  Completed   HIV Screening  Completed   Meningococcal B Vaccine  Aged Out   Pneumococcal Vaccine 78-22 Years old  Discontinued   COVID-19 Vaccine  Discontinued    Health Maintenance  Health Maintenance Due  Topic Date Due   HPV VACCINES (1 - 3-dose SCDM series) Never done   Health Maintenance Items Addressed: See Nurse Notes at the end of this note  Additional Screening:  Vision Screening: Recommended annual ophthalmology exams for early detection of glaucoma and other disorders of the eye. Would you like a referral to an eye doctor? No    Dental Screening: Recommended annual dental exams for proper oral hygiene  Community Resource Referral / Chronic Care Management: CRR required this visit?  No   CCM required this visit?  No  Plan:    I have personally reviewed and noted the following in the patient's chart:   Medical and social history Use of alcohol, tobacco or illicit drugs  Current medications and supplements including opioid prescriptions. Patient is not currently taking opioid prescriptions. Functional ability and status Nutritional status Physical activity Advanced directives List of other physicians Hospitalizations, surgeries, and ER visits in previous 12 months Vitals Screenings to include cognitive, depression, and falls Referrals and appointments  In addition, I have reviewed and discussed with patient certain preventive protocols, quality metrics, and best practice recommendations. A written personalized care plan for preventive services as well as general preventive health recommendations were provided to patient.   Ellouise VEAR Haws, LPN   [id number]/75/7974   After Visit Summary: (MyChart) Due to this being a telephonic visit, the after visit  summary with patients personalized plan was offered to patient via MyChart   Notes: Nothing significant to report at this time.

## 2024-06-23 NOTE — Telephone Encounter (Signed)
 Please call patient and schedule at earliest opening for CPE if he needs refills let me know and I will send in. As far as wife needing appt for FMLA to be completed just schedule an office visit and schedule physical later.

## 2024-06-28 ENCOUNTER — Encounter (HOSPITAL_COMMUNITY): Payer: Self-pay | Admitting: Clinical

## 2024-06-28 ENCOUNTER — Ambulatory Visit (INDEPENDENT_AMBULATORY_CARE_PROVIDER_SITE_OTHER): Admitting: Clinical

## 2024-06-28 DIAGNOSIS — F1010 Alcohol abuse, uncomplicated: Secondary | ICD-10-CM

## 2024-06-28 DIAGNOSIS — F1210 Cannabis abuse, uncomplicated: Secondary | ICD-10-CM

## 2024-06-28 DIAGNOSIS — F4310 Post-traumatic stress disorder, unspecified: Secondary | ICD-10-CM | POA: Diagnosis not present

## 2024-06-28 DIAGNOSIS — F331 Major depressive disorder, recurrent, moderate: Secondary | ICD-10-CM | POA: Diagnosis not present
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
## 2024-06-30 ENCOUNTER — Encounter: Payer: Self-pay | Admitting: Physician Assistant

## 2024-06-30 ENCOUNTER — Ambulatory Visit: Admitting: Physician Assistant

## 2024-06-30 VITALS — BP 130/80 | HR 71 | Temp 98.6°F | Ht 72.0 in | Wt 221.5 lb

## 2024-06-30 DIAGNOSIS — F419 Anxiety disorder, unspecified: Secondary | ICD-10-CM

## 2024-06-30 DIAGNOSIS — Z8042 Family history of malignant neoplasm of prostate: Secondary | ICD-10-CM

## 2024-06-30 DIAGNOSIS — F4310 Post-traumatic stress disorder, unspecified: Secondary | ICD-10-CM | POA: Diagnosis not present

## 2024-06-30 DIAGNOSIS — N529 Male erectile dysfunction, unspecified: Secondary | ICD-10-CM

## 2024-06-30 DIAGNOSIS — E782 Mixed hyperlipidemia: Secondary | ICD-10-CM

## 2024-06-30 DIAGNOSIS — M961 Postlaminectomy syndrome, not elsewhere classified: Secondary | ICD-10-CM

## 2024-06-30 DIAGNOSIS — K51911 Ulcerative colitis, unspecified with rectal bleeding: Secondary | ICD-10-CM | POA: Diagnosis not present

## 2024-06-30 DIAGNOSIS — F331 Major depressive disorder, recurrent, moderate: Secondary | ICD-10-CM

## 2024-06-30 LAB — LIPID PANEL
Cholesterol: 195 mg/dL (ref 0–200)
HDL: 73.4 mg/dL (ref >39.00)
LDL Cholesterol: 97 mg/dL (ref 0–99)
NonHDL: 122.01
Total CHOL/HDL Ratio: 3
Triglycerides: 126 mg/dL (ref 0.0–149.0)
VLDL: 25.2 mg/dL (ref 0.0–40.0)

## 2024-06-30 LAB — CBC WITH DIFFERENTIAL/PLATELET
Basophils Absolute: 0 K/uL (ref 0.0–0.1)
Basophils Relative: 0.5 % (ref 0.0–3.0)
Eosinophils Absolute: 0.1 K/uL (ref 0.0–0.7)
Eosinophils Relative: 1.7 % (ref 0.0–5.0)
HCT: 38.1 % — ABNORMAL LOW (ref 39.0–52.0)
Hemoglobin: 12 g/dL — ABNORMAL LOW (ref 13.0–17.0)
Lymphocytes Relative: 28.5 % (ref 12.0–46.0)
Lymphs Abs: 1.1 K/uL (ref 0.7–4.0)
MCHC: 31.5 g/dL (ref 30.0–36.0)
MCV: 74.9 fl — ABNORMAL LOW (ref 78.0–100.0)
Monocytes Absolute: 0.8 K/uL (ref 0.1–1.0)
Monocytes Relative: 22.1 % — ABNORMAL HIGH (ref 3.0–12.0)
Neutro Abs: 1.8 K/uL (ref 1.4–7.7)
Neutrophils Relative %: 47.2 % (ref 43.0–77.0)
Platelets: 335 K/uL (ref 150.0–400.0)
RBC: 5.09 Mil/uL (ref 4.22–5.81)
RDW: 21.7 % — ABNORMAL HIGH (ref 11.5–15.5)
WBC: 3.8 K/uL — ABNORMAL LOW (ref 4.0–10.5)

## 2024-06-30 LAB — COMPREHENSIVE METABOLIC PANEL WITH GFR
ALT: 11 U/L (ref 0–53)
AST: 16 U/L (ref 0–37)
Albumin: 4.6 g/dL (ref 3.5–5.2)
Alkaline Phosphatase: 53 U/L (ref 39–117)
BUN: 7 mg/dL (ref 6–23)
CO2: 31 meq/L (ref 19–32)
Calcium: 9.5 mg/dL (ref 8.4–10.5)
Chloride: 104 meq/L (ref 96–112)
Creatinine, Ser: 0.94 mg/dL (ref 0.40–1.50)
GFR: 101.33 mL/min (ref >60.00)
Glucose, Bld: 81 mg/dL (ref 70–99)
Potassium: 4.9 meq/L (ref 3.5–5.1)
Sodium: 141 meq/L (ref 135–145)
Total Bilirubin: 0.5 mg/dL (ref 0.2–1.2)
Total Protein: 7 g/dL (ref 6.0–8.3)

## 2024-06-30 LAB — PSA: PSA: 3.28 ng/mL (ref 0.10–4.00)

## 2024-06-30 MED ORDER — PRAZOSIN HCL 2 MG PO CAPS: 4.0000 mg | ORAL_CAPSULE | Freq: Every day | ORAL | 1 refills | Status: DC

## 2024-06-30 MED ORDER — MESALAMINE ER 0.375 G PO CP24: 375.0000 mg | ORAL_CAPSULE | Freq: Four times a day (QID) | ORAL | 6 refills | Status: AC

## 2024-06-30 MED ORDER — SILDENAFIL CITRATE 100 MG PO TABS: 50.0000 mg | ORAL_TABLET | Freq: Every day | ORAL | 11 refills | Status: AC | PRN

## 2024-06-30 MED ORDER — ATORVASTATIN CALCIUM 20 MG PO TABS: 20.0000 mg | ORAL_TABLET | Freq: Every day | ORAL | 0 refills | Status: DC

## 2024-06-30 MED ORDER — DIAZEPAM 5 MG PO TABS: 5.0000 mg | ORAL_TABLET | Freq: Every evening | ORAL | 1 refills | Status: AC | PRN

## 2024-06-30 NOTE — Progress Notes (Signed)
 Daniel Marsh is a 43 y.o. male here for a follow up of a pre-existing problem.  History of Present Illness:   Chief Complaint  Patient presents with   Referral    Pt would like a referral to GI for rectal bleeding off and on. Pt said just lost paternal aunt to colon cancer.     HPI  Discussed the use of AI scribe software for clinical note transcription with the patient, who gave verbal consent to proceed.  History of Present Illness Daniel Marsh is a 43 year old male with ulcerative colitis and chronic back pain who presents with worsening abdominal cramping and bleeding.  Ulcerative colitis  - Intense abdominal cramping with significant rectal bleeding, both progressively worsening - Cramping does not correlate with increased bleeding - During cramping episodes, experiences urge to defecate but only passes blood - Describes rectal bleeding as 'just a pool of blood' - No relief from cramping after bowel movements - No increase in bleeding with increased cramping - Currently on mesalamine , requires refill - No prior use of biologics or surgical interventions for ulcerative colitis but interested in pursuing additional interventions for relief   Chronic back pain and impaired mobility - Chronic back pain severe enough to impede mobility - Recent episode required 45 minutes to move a short distance - Previous pain management injections provided no relief - No pain management specialist follow-up since February, when last injection was received - Significant pain with mobility due to back issues  Sleep disturbance and medication side effects - On amitriptyline , recently increased to 150 mg, Trintellix , and Valium  - Increased amitriptyline  dose causes drowsiness - Persistent difficulty staying asleep due to distressing dreams  Lifestyle modification and weight loss - Increased physical activity, aiming for 20,000 steps three to four days per week - Dietary  changes implemented - Significant weight loss from 245 lbs to 221 lbs attributed to lifestyle changes  Mental health and substance use recovery - Attends therapy and Alcoholics Anonymous (AA) meetings, which are helpful  Erectile dysfunction (ED) -Would like to trial script, having issues with erectile dysfunction (ED)     Past Medical History:  Diagnosis Date   Anemia    Anxiety    Arthritis    Depression 2011   Hypertension    Liver disease    Myocardial infarction (HCC) 2015   PTSD (post-traumatic stress disorder)    Ulcerative colitis (HCC) 2019     Social History   Tobacco Use   Smoking status: Every Day    Current packs/day: 0.25    Average packs/day: 0.3 packs/day for 15.0 years (3.8 ttl pk-yrs)    Types: Cigarettes, Cigars   Smokeless tobacco: Current    Types: Chew   Tobacco comments:    I am usually a social smoker and he dips occ  Vaping Use   Vaping status: Never Used  Substance Use Topics   Alcohol use: Yes    Alcohol/week: 12.0 standard drinks of alcohol    Types: 12 Cans of beer per week   Drug use: Yes    Types: Marijuana    Comment: Was using to self medicate for depression and anxiety    Past Surgical History:  Procedure Laterality Date   ANKLE SURGERY Right 2011   KNEE SURGERY Left 2020   SPINE SURGERY  2013   Lumbar fusion L4-S1   WISDOM TOOTH EXTRACTION      Family History  Problem Relation Age of Onset   Osteoarthritis Mother  Depression Mother    Heart attack Mother 28   Osteoarthritis Father    Depression Father    Bladder Cancer Father        in remission   Prostate cancer Father    Depression Brother    Depression Brother    Diabetes Maternal Grandmother    Hyperlipidemia Maternal Grandmother    Hypertension Maternal Grandmother    Heart attack Maternal Grandmother    Osteoarthritis Maternal Grandfather    COPD Maternal Grandfather    Kidney disease Maternal Grandfather    Renal cancer Maternal Grandfather     Alcohol abuse Paternal Grandmother    COPD Paternal Grandmother    Diabetes Paternal Grandmother    Hypertension Paternal Grandmother    Lung cancer Paternal Grandmother    Osteoarthritis Paternal Grandfather    Heart attack Paternal Grandfather    Heart failure Paternal Grandfather    Pancreatic cancer Maternal Aunt    Colon cancer Maternal Uncle    Lung cancer Paternal Aunt     No Known Allergies  Current Medications:   Current Outpatient Medications:    acetaminophen  (TYLENOL ) 500 MG tablet, Take 1,500-2,000 mg by mouth every 6 (six) hours as needed (For foot pain)., Disp: , Rfl:    amitriptyline  (ELAVIL ) 150 MG tablet, Take 1 tablet (150 mg total) by mouth at bedtime., Disp: 30 tablet, Rfl: 2   dicyclomine  (BENTYL ) 10 MG capsule, Take 1 capsule (10 mg total) by mouth 3 (three) times daily as needed (abdominal pain/ cramping)., Disp: 90 capsule, Rfl: 3   omeprazole  (PRILOSEC) 40 MG capsule, Take 1 capsule (40 mg total) by mouth daily., Disp: 90 capsule, Rfl: 3   ondansetron  (ZOFRAN  ODT) 4 MG disintegrating tablet, Take 1 tablet (4 mg total) by mouth every 8 (eight) hours as needed for nausea or vomiting., Disp: 20 tablet, Rfl: 0   pregabalin  (LYRICA ) 75 MG capsule, Take 1 capsule (75 mg total) by mouth 3 (three) times daily., Disp: 90 capsule, Rfl: 2   sildenafil  (VIAGRA ) 100 MG tablet, Take 0.5-1 tablets (50-100 mg total) by mouth daily as needed for erectile dysfunction., Disp: 5 tablet, Rfl: 11   vortioxetine  HBr (TRINTELLIX ) 20 MG TABS tablet, Take 1 tablet (20 mg total) by mouth daily., Disp: 30 tablet, Rfl: 2   atorvastatin  (LIPITOR) 20 MG tablet, Take 1 tablet (20 mg total) by mouth daily., Disp: 90 tablet, Rfl: 0   diazepam  (VALIUM ) 5 MG tablet, Take 1 tablet (5 mg total) by mouth at bedtime as needed for anxiety., Disp: 30 tablet, Rfl: 1   mesalamine  (APRISO ) 0.375 g 24 hr capsule, Take 1 capsule (0.375 g total) by mouth in the morning, at noon, in the evening, and at  bedtime., Disp: 120 capsule, Rfl: 6   prazosin  (MINIPRESS ) 2 MG capsule, Take 2 capsules (4 mg total) by mouth at bedtime., Disp: 180 capsule, Rfl: 1   Review of Systems:   Negative unless otherwise specified per HPI.  Vitals:   Vitals:   06/30/24 1047  BP: 130/80  Pulse: 71  Temp: 98.6 F (37 C)  TempSrc: Temporal  SpO2: 98%  Weight: 221 lb 8 oz (100.5 kg)  Height: 6' (1.829 m)     Body mass index is 30.04 kg/m.  Physical Exam:   Physical Exam Vitals and nursing note reviewed.  Constitutional:      General: He is not in acute distress.    Appearance: He is well-developed. He is not ill-appearing or toxic-appearing.  Cardiovascular:  Rate and Rhythm: Normal rate and regular rhythm.     Pulses: Normal pulses.     Heart sounds: Normal heart sounds, S1 normal and S2 normal.  Pulmonary:     Effort: Pulmonary effort is normal.     Breath sounds: Normal breath sounds.  Skin:    General: Skin is warm and dry.  Neurological:     Mental Status: He is alert.     GCS: GCS eye subscore is 4. GCS verbal subscore is 5. GCS motor subscore is 6.  Psychiatric:        Speech: Speech normal.        Behavior: Behavior normal. Behavior is cooperative.     Assessment and Plan:    Assessment & Plan Ulcerative colitis with ongoing bleeding and cramping Severe cramping and aggressive bleeding unresponsive to mesalamine . Considering biologics or surgery. - Refer to [facility] in [place] or [facility] - Refill mesalamine  prescription. - Discuss biologics or surgical intervention with new gastroenterologist. -Update blood work to assess iron deficiency anemia   Family history prostate cancer - Check PSA   Chronic low back pain with sciatica Severe pain impacting mobility, unrelieved by previous injections. Considering neurosurgical evaluation. - Refer to neurosurgery for evaluation.  Major depressive disorder, recurrent, in treatment; Post-traumatic  stress disorder (PTSD); History of alcohol use disorder, in remission In treatment with amitriptyline  and Trintellix . Therapy and AA beneficial. - Continue amitriptyline  and Trintellix . - Continue therapy and AA attendance.  Obesity Recent weight loss due to increased exercise and dietary changes. - Continue exercise regimen, 20,000 steps 3-4 days a week. - Maintain dietary changes.  Erectile dysfunction (ED) -will trial Viagra  50-100 mg as needed      Lucie Buttner, PA-C

## 2024-07-01 ENCOUNTER — Ambulatory Visit: Payer: Self-pay | Admitting: Physician Assistant

## 2024-07-01 DIAGNOSIS — K51911 Ulcerative colitis, unspecified with rectal bleeding: Secondary | ICD-10-CM

## 2024-07-01 DIAGNOSIS — R972 Elevated prostate specific antigen [PSA]: Secondary | ICD-10-CM

## 2024-07-01 DIAGNOSIS — Z8042 Family history of malignant neoplasm of prostate: Secondary | ICD-10-CM

## 2024-07-07 DIAGNOSIS — K51911 Ulcerative colitis, unspecified with rectal bleeding: Secondary | ICD-10-CM | POA: Diagnosis not present

## 2024-07-11 LAB — LAB REPORT - SCANNED: HM Hepatitis Screen: NEGATIVE

## 2024-07-12 ENCOUNTER — Ambulatory Visit (HOSPITAL_COMMUNITY): Admitting: Clinical

## 2024-07-12 ENCOUNTER — Encounter (HOSPITAL_COMMUNITY): Payer: Self-pay | Admitting: Clinical

## 2024-07-12 DIAGNOSIS — F4310 Post-traumatic stress disorder, unspecified: Secondary | ICD-10-CM | POA: Diagnosis not present

## 2024-07-12 DIAGNOSIS — F1210 Cannabis abuse, uncomplicated: Secondary | ICD-10-CM

## 2024-07-12 DIAGNOSIS — F1010 Alcohol abuse, uncomplicated: Secondary | ICD-10-CM | POA: Diagnosis not present

## 2024-07-12 NOTE — Progress Notes (Signed)
 THERAPIST PROGRESS NOTE  Session Time: 9:04am-10:04am  Session #15  Participation Level: Active  Virtual Visit via Video Note  I connected with Daniel Marsh on 07/12/24 at  9:00 AM EDT by a video enabled telemedicine application and verified that I am speaking with the correct person using two identifiers.  Location: Patient: home Provider: home office   I discussed the limitations of evaluation and management by telemedicine and the availability of in person appointments. The patient expressed understanding and agreed to proceed.   I discussed the assessment and treatment plan with the patient. The patient was provided an opportunity to ask questions and all were answered. The patient agreed with the plan and demonstrated an understanding of the instructions.   The patient was advised to call back or seek an in-person evaluation if the symptoms worsen or if the condition fails to improve as anticipated.  I provided 60 minutes of non-face-to-face time during this encounter.  [name], LCSW   Behavioral Response: Casual Alert Euthymic and in pain  Type of Therapy: Individual Therapy  Treatment Goals addressed:  LTG: Explore Stages of Change and ambivalence about stopping the use of alcohol and marijuana  STG: CJ will explore personal core beliefs, rules and assumptions, and cognitive distortions through therapist using Cognitive Behavioral Therapy; learn how to develop replacement thoughts and challenge unhelpful thoughts.  STG:  Demonstrates progress in stages of grief at own pace  LTG: Elimination of maladaptive behaviors and thinking patterns which interfere with resolution of trauma as evidenced by self-report of fewer crying spells and depressed days  LTG: Learn practical breathing/grounding/mindfulness techniques; demonstrate mastery in session then report independent use of these skills out of session.  STG: Report a decrease in PTSD symptoms as  evidenced by a 50% reduction in overall score on a clinician administered PTSD assessment screen/scale  LTG: Score less than 9 on the PHQ-9 and less than 5 on the GAD-7 as evidenced by intermittent administration of the questionnaires to determine progress in managing depression and anxiety.  LTG: Learn and practice communication techniques such as active listening, I statements, open-ended questions, reflective listening, assertiveness, fair fighting rules, initiating conversations, and more as necessary and taught in session.  LTG: Explore and resolve issues relating to history of abuse/neglect/trauma victimization that have contributed to presentation of anxiety, hypervigilance, rage, and other symptoms.  STG: Learn emotion regulation strategies, distress tolerance skills, interpersonal effectiveness techniques, and mindfulness practices and use them in session and in life situations to improve results and satisfaction.  STG: Identify personal recovery goals, assisted by making lists of triggers, warning signs, risk factors, and coping skills.  LTG:  Learn about what forgiveness is and is not, then go through the four phases of forgiveness (uncovering, decision, work, deepening).   ProgressTowards Goals: Progressing  Interventions: Psychosocial Skills: developing replacement skills for unhealthy ones, Supportive, and Other: trauma processing   Summary: Daniel Quaintance is a 43 y.o. male who presents with depression and PTSD for therapy.  He presented oriented x5 and stated he was feeling it's been a rough morning, I'm on day 2 of a sciatica flare-up.  [name] evaluated patient's medication compliance, use of coping tools, and self-care, as applicable.  He provided an update on various aspects of his life that are normally discussed in therapy, including his decision to only drink on weekends, his decision to cut down on smoking cigarettes, his upcoming doctor appointments based on referrals made  by his PCP, and his use of coping skills.  He has not found it difficult to refrain from drinking during the week, but it has made his insomnia worse.  He has seen a new GI doctor who scheduled a colonoscopy to see how bad his Crohn's is and has an appointment to see a neurosurgeon in a couple of weeks to assess his sciatica issues for possible surgical remedies.  He disclosed that he has reached the point where his desire for relief has gotten larger than his fear of having surgery.  We processed how that is the gauge that surgeons often use.  He shared some of the old coping skills that he is starting to use again, including writing poetry, journaling his thoughts/feelings, and being outdoors in nature.  [name] explained the concept of needing to have healthy coping skills to replace the unhealthy ones as they are removed, otherwise, the needs that are met by said coping skills become overwhelming and the old unhealthy habits can creep back in.  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  Thus, any writing that he does will start out with beautiful images and then end in despairing and/or dark images.  [name] normalized for him that some of the thoughts he has are fairly common to many people, such as cleaning the house in case something happens and he is not able to return.  We explored whether death has to always be connected with negativity and he agreed that death is a part of life, so perhaps it does not.  He will do some work on that in the coming weeks.  He also shared that for a long time his regular state of anger is at a 5 on a 1 (none) to 10 (overwhelming) scale, and that he works hard to ensure that becoming physical when he is agitated does not happen.  Again, this was explored and when he described a 7 as needing the ambulance to come for the other person, [name] challenged him to  think about whether that is actually a higher number, and if not, what registers as higher.  Finally, we discussed his attendance at an AA meeting that he found to be uplifting and in which he felt he had much in common despite his assumption that he was the only one.  However, his reluctance in returning is that he made the assumption that those people have already conquered the demon I am still struggling with and I don't deserve to go until I too have conquered it.  [name] shared more of the AA philosophy and reality, which he accepted readily.   Suicidal/Homicidal: No without intent/plan  Therapist Response:  Patient is progressing AEB engaging in scheduled therapy session.  Throughout the session, [name] gave patient the opportunity to explore thoughts and feelings associated with current life situations and past/present stressors.   [name] challenged patient gently and appropriately to consider different ways of looking at reported issues. [name] encouraged patient's expression of feelings and validated these using empathy, active listening, open body language, and unconditional positive regard.   Two additional appointments were scheduled on 9/24 and 10/7.     Plan/Recommendations:  Return to therapy at next scheduled appointment on 8/26, read Bill's Story in the [employer] Book of Alcoholics Anonymous, continue writing, explore the connection between life and death and whether despair is always necessary  Diagnosis:  PTSD (post-traumatic stress disorder)  Cannabis use  disorder, mild, abuse  Mild alcohol use disorder  Collaboration of Care: Psychiatrist AEB - therapist can read psychiatrist notes;  therapy notes are available to psychiatrist in Epic  Patient/Guardian was advised Release of Information must be obtained prior to any record release in order to collaborate their care with an outside provider. Patient/Guardian was advised if they have not already done so to contact the registration department to  sign all necessary forms in order for us  to release information regarding their care.   Consent: Patient/Guardian gives verbal consent for treatment and assignment of benefits for services provided during this visit. Patient/Guardian expressed understanding and agreed to proceed.   [name], LCSW 07/12/2024

## 2024-07-15 ENCOUNTER — Inpatient Hospital Stay
Admission: RE | Admit: 2024-07-15 | Discharge: 2024-07-15 | Disposition: A | Discharge status: Home / Self Care | Payer: Self-pay | Source: Ambulatory Visit | Attending: Orthopedic Surgery | Admitting: Orthopedic Surgery

## 2024-07-15 ENCOUNTER — Other Ambulatory Visit: Payer: Self-pay | Admitting: Family Medicine

## 2024-07-15 DIAGNOSIS — Z049 Encounter for examination and observation for unspecified reason: Secondary | ICD-10-CM

## 2024-07-21 ENCOUNTER — Encounter: Payer: Self-pay | Admitting: Orthopedic Surgery

## 2024-07-21 NOTE — Progress Notes (Signed)
 Referring Physician:  Job Lukes, PA [address] [place],  [place] [address]  Primary Physician:  Job Lukes, [place]  History of Present Illness: 07/27/2024 Daniel Marsh has a history of MI, HTN, UC, PTSD, hyperlipidemia, chronic pain, failed back syndrome, obesity, depression, anxiety.   History of lumbar fusion L4-L5 in 2012- he was told his sciatic nerve was nicked. He did not get better after surgery.   He has constant LBP, left > right, with constant left posterior leg pain to his foot with numbness and tingling. He also has intermittent right lateral/posterior leg pain to his knee that is new. Left leg feels heavy and he has burning with constant weakness. He has intermittent weakness in right leg. Pain is worse with activity and better with ice.   Had previous lumbar injections with no improvement- was seeing [facility] in [place].   Tobacco use: smokes 1/2 PPD x 20 years.   Bowel/Bladder Dysfunction: none  Conservative measures:  Physical therapy:  has not participated in 2025 Multimodal medical therapy including regular antiinflammatories:  Lyrica , Gabapentin , Tylenol  Injections:  09/15/2023-  Left SI ESI  03/10/2023- Left TFESI 07/24/2022- Left S1 ESI  Past Surgery:  Lumbar fusion L4-L5 in 2012  [name]-Bristol Likes has no symptoms of cervical myelopathy.  The symptoms are causing a significant impact on the patient's life.   Review of Systems:  A 10 point review of systems is negative, except for the pertinent positives and negatives detailed in the HPI.  Past Medical History: Past Medical History:  Diagnosis Date   Anemia    Anxiety    Arthritis    Depression 2011   Hypertension    Liver disease    Myocardial infarction (HCC) 2015   PTSD (post-traumatic stress disorder)    Ulcerative colitis (HCC) 2019    Past Surgical History: Past Surgical History:  Procedure Laterality Date   ANKLE SURGERY Right 2011   KNEE SURGERY  Left 2020   SPINE SURGERY  2013   Lumbar fusion L4-S1   WISDOM TOOTH EXTRACTION      Allergies: Allergies as of 07/27/2024   (No Known Allergies)    Medications: Outpatient Encounter Medications as of 07/27/2024  Medication Sig   acetaminophen  (TYLENOL ) 500 MG tablet Take 1,500-2,000 mg by mouth every 6 (six) hours as needed (For foot pain).   amitriptyline  (ELAVIL ) 150 MG tablet Take 1 tablet (150 mg total) by mouth at bedtime.   atorvastatin  (LIPITOR) 20 MG tablet Take 1 tablet (20 mg total) by mouth daily.   diazepam  (VALIUM ) 5 MG tablet Take 1 tablet (5 mg total) by mouth at bedtime as needed for anxiety.   dicyclomine  (BENTYL ) 10 MG capsule Take 1 capsule (10 mg total) by mouth 3 (three) times daily as needed (abdominal pain/ cramping).   mesalamine  (APRISO ) 0.375 g 24 hr capsule Take 1 capsule (0.375 g total) by mouth in the morning, at noon, in the evening, and at bedtime.   omeprazole  (PRILOSEC) 40 MG capsule Take 1 capsule (40 mg total) by mouth daily.   ondansetron  (ZOFRAN  ODT) 4 MG disintegrating tablet Take 1 tablet (4 mg total) by mouth every 8 (eight) hours as needed for nausea or vomiting.   prazosin  (MINIPRESS ) 2 MG capsule Take 2 capsules (4 mg total) by mouth at bedtime.   pregabalin  (LYRICA ) 75 MG capsule Take 1 capsule (75 mg total) by mouth 3 (three) times daily.   sildenafil  (VIAGRA ) 100 MG tablet Take 0.5-1 tablets (50-100 mg total)  by mouth daily as needed for erectile dysfunction.   vortioxetine  HBr (TRINTELLIX ) 20 MG TABS tablet Take 1 tablet (20 mg total) by mouth daily.   [DISCONTINUED] amitriptyline  (ELAVIL ) 150 MG tablet Take 1 tablet (150 mg total) by mouth at bedtime.   [DISCONTINUED] vortioxetine  HBr (TRINTELLIX ) 20 MG TABS tablet Take 1 tablet (20 mg total) by mouth daily.   No facility-administered encounter medications on file as of 07/27/2024.    Social History: Social History   Tobacco Use   Smoking status: Every Day    Current packs/day: 0.50     Average packs/day: 0.5 packs/day for 20.0 years (10.0 ttl pk-yrs)    Types: Cigarettes, Cigars   Smokeless tobacco: Former    Types: Engineer, drilling   Vaping status: Never Used  Substance Use Topics   Alcohol use: Not Currently    Comment: on the weekend usually around 4 beers total   Drug use: Yes    Types: Marijuana    Family Medical History: Family History  Problem Relation Age of Onset   Osteoarthritis Mother    Depression Mother    Heart attack Mother 19   Osteoarthritis Father    Depression Father    Bladder Cancer Father        in remission   Prostate cancer Father    Depression Brother    Depression Brother    Diabetes Maternal Grandmother    Hyperlipidemia Maternal Grandmother    Hypertension Maternal Grandmother    Heart attack Maternal Grandmother    Osteoarthritis Maternal Grandfather    COPD Maternal Grandfather    Kidney disease Maternal Grandfather    Renal cancer Maternal Grandfather    Alcohol abuse Paternal Grandmother    COPD Paternal Grandmother    Diabetes Paternal Grandmother    Hypertension Paternal Grandmother    Lung cancer Paternal Grandmother    Osteoarthritis Paternal Grandfather    Heart attack Paternal Grandfather    Heart failure Paternal Grandfather    Pancreatic cancer Maternal Aunt    Colon cancer Maternal Uncle    Lung cancer Paternal Aunt     Physical Examination: Vitals:   07/27/24 0905  BP: 128/76    General: Patient is well developed, well nourished, calm, collected, and in no apparent distress. Attention to examination is appropriate.  Respiratory: Patient is breathing without any difficulty.   NEUROLOGICAL:     Awake, alert, oriented to person, place, and time.  Speech is clear and fluent. Fund of knowledge is appropriate.   Cranial Nerves: Pupils equal round and reactive to light.  Facial tone is symmetric.    Mild lower posterior lumbar tenderness.   No abnormal lesions on exposed skin.    Strength: Side Biceps Triceps Deltoid Interossei Grip Wrist Ext. Wrist Flex.  R 5 5 5 5 5 5 5   L 5 5 5 5 5 5 5    Side Iliopsoas Quads Hamstring PF DF EHL  R 5 5 5 5 5 5   L 5 5 5 5  4- 4-   Reflexes are 2+ and symmetric at the biceps, brachioradialis, patella and achilles.   Hoffman's is absent.  Clonus is not present.   Bilateral upper and lower extremity sensation is intact to light touch.     No pain with IR/ER of both hips.   He can heel/toe stand on right leg. He can heel stand on left leg, he has difficulty toe standing.   He ambulates with a cane.   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 Last well-formed lumbar vertebra will be designated L5.   Lumbar alignment within normal limits. Fusion hardware at L4-L5. Minimal degenerative osteophyte at L3-L4. The disc spaces are otherwise patent   IMPRESSION: Postsurgical changes at L4-L5.  No acute osseous abnormality     Electronically Signed   By: Luke Bun M.D.   On: 06/12/2022 02:04  I have personally reviewed the images and agree with the above interpretation.  Assessment and Plan: Mr. Daniel Marsh has a history of lumbar fusion L4-L5 in 2012- he was told his sciatic nerve was nicked. He did not get better after surgery.   He has constant LBP, left > right, with constant left posterior leg pain to his foot with numbness and tingling. He also has intermittent right lateral/posterior leg pain to his knee that is new. Left leg feels heavy and he has burning with constant weakness. He has intermittent weakness in right leg.   Xrays from 2023 show ALIF L4-L5 with lumbar spondylosis. He has weakness 4-/5 in left DF/EHL  Treatment options discussed with patient and following plan made:   - MRI of lumbar spine to further evaluate bilateral leg pain and left leg weakness. Will do WIDE BORE MRI.  - Lumbar xrays  with flex/ext- will get when he has MRI.  - May consider EMG/NCS at some point. Will see what imaging shows first.  - Will schedule follow up visit to review MRI/xray results once I get them back.   I spent a total of 35 minutes in face-to-face and non-face-to-face activities related to this patient's care today including review of outside records, review of imaging, review of symptoms, physical exam, discussion of differential diagnosis, discussion of treatment options, and documentation.   Thank you for involving me in the care of this patient.   Glade Boys PA-C Dept. of Neurosurgery

## 2024-07-25 ENCOUNTER — Telehealth (HOSPITAL_BASED_OUTPATIENT_CLINIC_OR_DEPARTMENT_OTHER): Admitting: Psychiatry

## 2024-07-25 ENCOUNTER — Encounter (HOSPITAL_COMMUNITY): Payer: Self-pay | Admitting: Psychiatry

## 2024-07-25 VITALS — Wt 221.0 lb

## 2024-07-25 DIAGNOSIS — F331 Major depressive disorder, recurrent, moderate: Secondary | ICD-10-CM | POA: Diagnosis not present

## 2024-07-25 DIAGNOSIS — F4310 Post-traumatic stress disorder, unspecified: Secondary | ICD-10-CM | POA: Diagnosis not present

## 2024-07-25 MED ORDER — VORTIOXETINE HBR 20 MG PO TABS
20.0000 mg | ORAL_TABLET | Freq: Every day | ORAL | 2 refills | Status: DC
Start: 2024-07-25 — End: 2024-10-24

## 2024-07-25 MED ORDER — AMITRIPTYLINE HCL 150 MG PO TABS: 150.0000 mg | ORAL_TABLET | Freq: Every day | ORAL | 2 refills | Status: DC

## 2024-07-25 NOTE — Progress Notes (Signed)
 [facility] MD Virtual Progress Note   Patient Location: Home Provider Location: Home Office  I connect with patient by video and verified that I am speaking with correct person by using two identifiers. I discussed the limitations of evaluation and management by telemedicine and the availability of in person appointments. I also discussed with the patient that there may be a patient responsible charge related to this service. The patient expressed understanding and agreed to proceed.  [name]-Bristol Gammel [id number] 43 y.o.  07/25/2024 10:11 AM  History of Present Illness:  Patient is evaluated by video session.  He reported summer going well and he had a good time with the family.  On July 4 he did go to [place] with the family and had a nice time.  His kids back to school.  He reported therapy helping as had a better communication with his wife.  Patient told she understand his mood as sometimes he feel preoccupied and just to himself and that did not bother his wife as used to.  Denies any crying spells or any feeling of hopelessness or any suicidal thoughts.  Patient lives with his wife and 77-year-old, 74-year-old and 28 year old.  He has some time irritability but denies any mania, psychosis, hallucination or any paranoia.  Recently had a visit with his PCP and labs are drawn.  His lipid panel is improved from the past.  He has anemia.  He still have some GI symptoms and he is going to have a colonoscopy as recently seen in the GI who recommended the procedure.  Patient is taking Minipress , Valium , Lyrica  from primary care.  He denies any paranoia.  He like to keep his current medication.  He is trying to lose weight and started cooking food at home which is healthier.  He denies drinking or using any illegal substances.  He does not feel isolated, withdrawn and had negative thinking.  He feels the medicine working and helping him.  Past Psychiatric History: H/O depression,  PTSD and anxiety. 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 Tried Prozac and Wellbutrin but did not like, Zoloft caused sexual side effects and Cymbalta worked for a while. H/O group therapy.  No history of suicidal attempt or inpatient treatment. H/O DUI and incarceration for 9 months in military for the charges of sexual assault and indecent exposure.  Past Medical History:  Diagnosis Date   Anemia    Anxiety    Arthritis    Depression 2011   Hypertension    Liver disease    Myocardial infarction (HCC) 2015   PTSD (post-traumatic stress disorder)    Ulcerative colitis (HCC) 2019    Outpatient Encounter Medications as of 07/25/2024  Medication Sig   acetaminophen  (TYLENOL ) 500 MG tablet Take 1,500-2,000 mg by mouth every 6 (six) hours as needed (For foot pain).   amitriptyline  (ELAVIL ) 150 MG tablet Take 1 tablet (150 mg total) by mouth at bedtime.   atorvastatin  (LIPITOR) 20 MG tablet Take 1 tablet (20 mg total) by mouth daily.   diazepam  (VALIUM ) 5 MG tablet Take 1 tablet (5 mg total) by mouth at bedtime as needed for anxiety.   dicyclomine  (BENTYL ) 10 MG capsule Take 1 capsule (10 mg total) by mouth 3 (three) times daily as needed (abdominal pain/ cramping).   mesalamine  (APRISO ) 0.375 g 24 hr capsule Take 1 capsule (0.375 g total) by mouth in the morning, at noon, in the evening, and at bedtime.   omeprazole  (PRILOSEC) 40 MG capsule Take 1  capsule (40 mg total) by mouth daily.   ondansetron  (ZOFRAN  ODT) 4 MG disintegrating tablet Take 1 tablet (4 mg total) by mouth every 8 (eight) hours as needed for nausea or vomiting.   prazosin  (MINIPRESS ) 2 MG capsule Take 2 capsules (4 mg total) by mouth at bedtime.   pregabalin  (LYRICA ) 75 MG capsule Take 1 capsule (75 mg total) by mouth 3 (three) times daily.   sildenafil  (VIAGRA ) 100 MG tablet Take 0.5-1 tablets (50-100 mg total) by mouth daily as needed for erectile dysfunction.   vortioxetine  HBr (TRINTELLIX ) 20 MG TABS tablet Take 1 tablet  (20 mg total) by mouth daily.   No facility-administered encounter medications on file as of 07/25/2024.    Recent Results (from the past 2160 hours)  CBC with Differential/Platelet     Status: Abnormal   Collection Time: 06/30/24 11:24 AM  Result Value Ref Range   WBC 3.8 (L) 4.0 - 10.5 K/uL   RBC 5.09 4.22 - 5.81 Mil/uL   Hemoglobin 12.0 (L) 13.0 - 17.0 g/dL   HCT 61.8 (L) 60.9 - 47.9 %   MCV 74.9 (L) 78.0 - 100.0 fl   MCHC 31.5 30.0 - 36.0 g/dL   RDW 78.2 (H) 88.4 - 84.4 %   Platelets 335.0 150.0 - 400.0 K/uL   Neutrophils Relative % 47.2 43.0 - 77.0 %   Lymphocytes Relative 28.5 12.0 - 46.0 %   Monocytes Relative 22.1 Repeated and verified X2. (H) 3.0 - 12.0 %   Eosinophils Relative 1.7 0.0 - 5.0 %   Basophils Relative 0.5 0.0 - 3.0 %   Neutro Abs 1.8 1.4 - 7.7 K/uL   Lymphs Abs 1.1 0.7 - 4.0 K/uL   Monocytes Absolute 0.8 0.1 - 1.0 K/uL   Eosinophils Absolute 0.1 0.0 - 0.7 K/uL   Basophils Absolute 0.0 0.0 - 0.1 K/uL  Comprehensive metabolic panel with GFR     Status: None   Collection Time: 06/30/24 11:24 AM  Result Value Ref Range   Sodium 141 135 - 145 mEq/L   Potassium 4.9 3.5 - 5.1 mEq/L   Chloride 104 96 - 112 mEq/L   CO2 31 19 - 32 mEq/L   Glucose, Bld 81 70 - 99 mg/dL   BUN 7 6 - 23 mg/dL   Creatinine, Ser 9.05 0.40 - 1.50 mg/dL   Total Bilirubin 0.5 0.2 - 1.2 mg/dL   Alkaline Phosphatase 53 39 - 117 U/L   AST 16 0 - 37 U/L   ALT 11 0 - 53 U/L   Total Protein 7.0 6.0 - 8.3 g/dL   Albumin 4.6 3.5 - 5.2 g/dL   GFR 898.66 >39.99 mL/min    Comment: Calculated using the CKD-EPI Creatinine Equation (2021)   Calcium  9.5 8.4 - 10.5 mg/dL  PSA     Status: None   Collection Time: 06/30/24 11:24 AM  Result Value Ref Range   PSA 3.28 0.10 - 4.00 ng/mL    Comment: Test performed using Access Hybritech PSA Assay, a parmagnetic partical, chemiluminecent immunoassay.  Lipid panel     Status: None   Collection Time: 06/30/24 11:24 AM  Result Value Ref Range    Cholesterol 195 0 - 200 mg/dL    Comment: ATP III Classification       Desirable:  < 200 mg/dL               Borderline High:  200 - 239 mg/dL          High:  > =  240 mg/dL   Triglycerides 873.9 0.0 - 149.0 mg/dL    Comment: Normal:  <849 mg/dLBorderline High:  150 - 199 mg/dL   HDL 26.59 >60.99 mg/dL   VLDL 74.7 0.0 - 59.9 mg/dL   LDL Cholesterol 97 0 - 99 mg/dL   Total CHOL/HDL Ratio 3     Comment:                Men          Women1/2 Average Risk     3.4          3.3Average Risk          5.0          4.42X Average Risk          9.6          7.13X Average Risk          15.0          11.0                       NonHDL 122.01     Comment: NOTE:  Non-HDL goal should be 30 mg/dL higher than patient's LDL goal (i.e. LDL goal of < 70 mg/dL, would have non-HDL goal of < 100 mg/dL)     Psychiatric Specialty Exam: Physical Exam  Review of Systems  Weight 221 lb (100.2 kg).There is no height or weight on file to calculate BMI.  General Appearance: Casual  Eye Contact:  Fair  Speech:  Slow  Volume:  Decreased  Mood:  Euthymic  Affect:  Appropriate  Thought Process:  Goal Directed  Orientation:  Full (Time, Place, and Person)  Thought Content:  WDL  Suicidal Thoughts:  No  Homicidal Thoughts:  No  Memory:  Immediate;   Good Recent;   Good Remote;   Fair  Judgement:  Intact  Insight:  Present  Psychomotor Activity:  Normal  Concentration:  Concentration: Good and Attention Span: Good  Recall:  Good  Fund of Knowledge:  Good  Language:  Good  Akathisia:  No  Handed:  Right  AIMS (if indicated):     Assets:  Communication Skills Desire for Improvement Housing Transportation  ADL's:  Intact  Cognition:  WNL  Sleep:  7 hrs       06/30/2024   11:22 AM 06/23/2024    9:39 AM 03/30/2024    8:57 AM 09/15/2023    3:15 PM 09/15/2023    3:01 PM  Depression screen PHQ 2/9  Decreased Interest 2 0 3 1 0  Down, Depressed, Hopeless 3 1 2 1  0  PHQ - 2 Score 5 1 5 2  0  Altered sleeping 3   3    Tired, decreased energy 2  3    Change in appetite 1  2    Feeling bad or failure about yourself  2  2    Trouble concentrating 1  1    Moving slowly or fidgety/restless 1  0    Suicidal thoughts 1  1    PHQ-9 Score 16  17    Difficult doing work/chores Somewhat difficult        Assessment/Plan: MDD (major depressive disorder), recurrent episode, moderate (HCC) - Plan: amitriptyline  (ELAVIL ) 150 MG tablet, vortioxetine  HBr (TRINTELLIX ) 20 MG TABS tablet  PTSD (post-traumatic stress disorder) - Plan: amitriptyline  (ELAVIL ) 150 MG tablet, vortioxetine  HBr (TRINTELLIX ) 20 MG TABS tablet  Patient is 43 year old African-American man with a history  of hypertension, myocardial infarction, ulcerative colitis, chronic pain, PTSD, major depressive disorder.  Currently stable on his current medication.  Reviewed blood work results and collateral information from other providers.  He has no concerns or side effects from the medication.  Continue amitriptyline  150 mg at bedtime, Trintellix  20 mg daily.  He is also taking Minipress , Valium  and Lyrica  from his PCP.  Scheduled to have colonoscopy in few weeks.  Discussed medication side effects and benefits.  Recommended to call back if is any question or any concern.  He lost weight and his lipid panel is improved from the past.  Follow-up in 3 months encouraged to continue therapy with Ms. Edwena.   Follow Up Instructions:     I discussed the assessment and treatment plan with the patient. The patient was provided an opportunity to ask questions and all were answered. The patient agreed with the plan and demonstrated an understanding of the instructions.   The patient was advised to call back or seek an in-person evaluation if the symptoms worsen or if the condition fails to improve as anticipated.    Collaboration of Care: Other provider involved in patient's care AEB notes are available in epic to review  Patient/Guardian was advised Release  of Information must be obtained prior to any record release in order to collaborate their care with an outside provider. Patient/Guardian was advised if they have not already done so to contact the registration department to sign all necessary forms in order for us  to release information regarding their care.   Consent: Patient/Guardian gives verbal consent for treatment and assignment of benefits for services provided during this visit. Patient/Guardian expressed understanding and agreed to proceed.     Total encounter time 15 minutes which includes face-to-face time, chart reviewed, care coordination, order entry and documentation during this encounter.   Note: This document was prepared by [employer] voice dictation technology and any errors that results from this process are unintentional.    Leni ONEIDA Client, MD 07/25/2024

## 2024-07-26 ENCOUNTER — Encounter (HOSPITAL_COMMUNITY): Payer: Self-pay | Admitting: Clinical

## 2024-07-26 ENCOUNTER — Ambulatory Visit (INDEPENDENT_AMBULATORY_CARE_PROVIDER_SITE_OTHER): Admitting: Clinical

## 2024-07-26 DIAGNOSIS — F331 Major depressive disorder, recurrent, moderate: Secondary | ICD-10-CM

## 2024-07-26 DIAGNOSIS — F4310 Post-traumatic stress disorder, unspecified: Secondary | ICD-10-CM | POA: Diagnosis not present

## 2024-07-26 DIAGNOSIS — F1010 Alcohol abuse, uncomplicated: Secondary | ICD-10-CM | POA: Diagnosis not present

## 2024-07-26 DIAGNOSIS — F1210 Cannabis abuse, uncomplicated: Secondary | ICD-10-CM

## 2024-07-26 NOTE — Progress Notes (Unsigned)
 THERAPIST PROGRESS NOTE  Session Time: 9:04am-9:57am  Session #16  Participation Level: Active  Virtual Visit via Video Note  I connected with Daniel Marsh on 07/28/24 at  9:00 AM EDT by a video enabled telemedicine application and verified that I am speaking with the correct person using two identifiers.  Location: Patient: home Provider: home office   I discussed the limitations of evaluation and management by telemedicine and the availability of in person appointments. The patient expressed understanding and agreed to proceed.   I discussed the assessment and treatment plan with the patient. The patient was provided an opportunity to ask questions and all were answered. The patient agreed with the plan and demonstrated an understanding of the instructions.   The patient was advised to call back or seek an in-person evaluation if the symptoms worsen or if the condition fails to improve as anticipated.  I provided 53 minutes of non-face-to-face time during this encounter.  Daniel JINNY Crest, LCSW   Behavioral Response: Casual Alert Negative, Irritable, and Tearful  Type of Therapy: Individual Therapy  Treatment Goals addressed:  LTG: Explore Stages of Change and ambivalence about stopping the use of alcohol and marijuana  STG: CJ will explore personal core beliefs, rules and assumptions, and cognitive distortions through therapist using Cognitive Behavioral Therapy; learn how to develop replacement thoughts and challenge unhelpful thoughts.  STG:  Demonstrates progress in stages of grief at own pace  LTG: Elimination of maladaptive behaviors and thinking patterns which interfere with resolution of trauma as evidenced by self-report of fewer crying spells and depressed days  LTG: Learn practical breathing/grounding/mindfulness techniques; demonstrate mastery in session then report independent use of these skills out of session.  STG: Report a decrease in PTSD symptoms  as evidenced by a 50% reduction in overall score on a clinician administered PTSD assessment screen/scale  LTG: Score less than 9 on the PHQ-9 and less than 5 on the GAD-7 as evidenced by intermittent administration of the questionnaires to determine progress in managing depression and anxiety.  LTG: Learn and practice communication techniques such as active listening, I statements, open-ended questions, reflective listening, assertiveness, fair fighting rules, initiating conversations, and more as necessary and taught in session.  LTG: Explore and resolve issues relating to history of abuse/neglect/trauma victimization that have contributed to presentation of anxiety, hypervigilance, rage, and other symptoms.  STG: Learn emotion regulation strategies, distress tolerance skills, interpersonal effectiveness techniques, and mindfulness practices and use them in session and in life situations to improve results and satisfaction.  STG: Identify personal recovery goals, assisted by making lists of triggers, warning signs, risk factors, and coping skills.  LTG:  Learn about what forgiveness is and is not, then go through the four phases of forgiveness (uncovering, decision, work, deepening).   ProgressTowards Goals: Progressing  Interventions: Assertiveness Training, Supportive, and Other: trauma processing   Summary: Daniel Marsh is a 43 y.o. male who presents with depression and PTSD for therapy.  He presented oriented x5 and stated he was feeling here, that's all.  [name] evaluated patient's medication compliance, use of coping tools, and self-care, as applicable.  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  He disclosed that he  does not trust himself to be able  to cope with these feelings, so tries not to think about it.  [name] suggested writing a letter to the president who was in charge at that time to explain his experience, and he thought that was a good idea, whether he sends it or not.  We discussed at length a phone call with his mother that occurred yesterday with his mother, during which he tried to tell her that he did not believe her to be a bad person, but he wanted to convey to her that he felt his whole childhood like he was not a priority for her.  She was defensive and became very offended, which dismayed him because he is actually the only of her children she is in contact with for reasons similar to his own.  He does not feel obligated to please anyone else, but he does feel that toward his mother.  [name] helped him to use CBT techniques to look at his feeling of guilt, the thought leading to it I am too blunt, whether that is actually a fact, if it is helpful, and what he would advise to a friend in the same situation.  He was able to rewrite the thought which led to a conviction that there is nothing to feel guilty about.  There were other feelings and thoughts also at play that were examined with CBT also.  [name] led him to examine his boundaries specifically with her, encouraged him to (1) set them with mother, (2) tell her about them, then (3) enforce them if she crosses them.  We discussed specific words that could be used, such as telling mother he is hoping to have a conversation and is asking her to listen without responding, because he just needs to tell her his feelings, does not need an apology.  We also explored how he is in fact a mirror of his mother, although he has a willingness to change, even an eagerness,  that she does not have.  Suicidal/Homicidal: No without intent/plan  Therapist Response:  Patient is progressing AEB engaging in scheduled therapy session.  Throughout the session, [name] gave patient  the opportunity to explore thoughts and feelings associated with current life situations and past/present stressors.   [name] challenged patient gently and appropriately to consider different ways of looking at reported issues. [name] encouraged patient's expression of feelings and validated these using empathy, active listening, open body language, and unconditional positive regard.   Two additional appointments were scheduled on 9/24 and 10/7.     Plan/Recommendations:  Return to therapy at next scheduled appointment on 9/24, think about and write down what he wants his boundaries specifically with mother to be, talk to her about them, write letter as discussed in session about policies that caused death of friends  Diagnosis:  PTSD (post-traumatic stress disorder)  MDD (major depressive disorder), recurrent episode, moderate (HCC)  Cannabis use disorder, mild, abuse  Mild alcohol use disorder  Collaboration of Care: Psychiatrist AEB - therapist can read psychiatrist notes;  therapy notes are available to psychiatrist in Epic  Patient/Guardian was advised Release of Information must be obtained prior to any record release in order to collaborate their care with an outside provider. Patient/Guardian was advised if they have not already done so to contact the registration department to sign all necessary forms in order for us  to release information regarding their care.   Consent: Patient/Guardian gives verbal consent for treatment and assignment of benefits for services provided during this visit. Patient/Guardian expressed understanding  and agreed to proceed.   Daniel JINNY Crest, LCSW 07/28/2024

## 2024-07-27 ENCOUNTER — Ambulatory Visit: Admitting: Orthopedic Surgery

## 2024-07-27 ENCOUNTER — Encounter: Payer: Self-pay | Admitting: Orthopedic Surgery

## 2024-07-27 VITALS — BP 128/76 | Ht 72.0 in | Wt 218.0 lb

## 2024-07-27 DIAGNOSIS — R29898 Other symptoms and signs involving the musculoskeletal system: Secondary | ICD-10-CM

## 2024-07-27 DIAGNOSIS — M47816 Spondylosis without myelopathy or radiculopathy, lumbar region: Secondary | ICD-10-CM

## 2024-07-27 DIAGNOSIS — Z981 Arthrodesis status: Secondary | ICD-10-CM

## 2024-07-27 DIAGNOSIS — M5416 Radiculopathy, lumbar region: Secondary | ICD-10-CM

## 2024-07-27 DIAGNOSIS — G8929 Other chronic pain: Secondary | ICD-10-CM

## 2024-07-27 NOTE — Patient Instructions (Signed)
 It was so nice to see you today. Thank you so much for coming in.    I want to get an MRI of your lower back to look into things further. We will get this approved through your insurance and [facility] will call you to schedule the appointment. Ask about your patient responsibility. You do not need to pay this prior to getting MRI, they can bill you.   Make sure you are scheduled for WIDE BORE MRI and remind them to do your back xrays.   After you have the MRI and xrays, it can take 14-28 days for me to get the results back. If I don't have them in 2 weeks, we will call to try to get the results.   Once I have the results, we will call you to schedule a follow up visit with me to review them.   Please do not hesitate to call if you have any questions or concerns. You can also message me in MyChart.   Glade Boys PA-C [phone]     The physicians and staff at [facility] are committed to providing excellent care. You may receive a survey asking for feedback about your experience at our office. We value you your feedback and appreciate you taking the time to to fill it out. The [facility] leadership team is also available to discuss your experience in person, feel free to contact us  [phone].

## 2024-07-28 ENCOUNTER — Encounter (HOSPITAL_COMMUNITY): Payer: Self-pay | Admitting: Clinical

## 2024-08-10 DIAGNOSIS — K5190 Ulcerative colitis, unspecified, without complications: Secondary | ICD-10-CM | POA: Diagnosis not present

## 2024-08-10 DIAGNOSIS — K648 Other hemorrhoids: Secondary | ICD-10-CM | POA: Diagnosis not present

## 2024-08-10 LAB — HM COLONOSCOPY

## 2024-08-16 ENCOUNTER — Telehealth: Payer: Self-pay | Admitting: *Deleted

## 2024-08-16 NOTE — Telephone Encounter (Signed)
 Spoke to pt told him I received a fax from [facility] asking us  for records from your GI provider and colonoscopy. Told him I am not allowed to release that information you will have to contact your GI to send to them. Pt verbalized understanding.

## 2024-08-20 ENCOUNTER — Ambulatory Visit
Admission: RE | Admit: 2024-08-20 | Discharge: 2024-08-20 | Disposition: A | Discharge status: Home / Self Care | Source: Ambulatory Visit | Attending: Orthopedic Surgery | Admitting: Orthopedic Surgery

## 2024-08-20 DIAGNOSIS — Z981 Arthrodesis status: Secondary | ICD-10-CM

## 2024-08-20 DIAGNOSIS — R29898 Other symptoms and signs involving the musculoskeletal system: Secondary | ICD-10-CM

## 2024-08-20 DIAGNOSIS — M5416 Radiculopathy, lumbar region: Secondary | ICD-10-CM

## 2024-08-20 DIAGNOSIS — G8929 Other chronic pain: Secondary | ICD-10-CM

## 2024-08-24 ENCOUNTER — Ambulatory Visit (INDEPENDENT_AMBULATORY_CARE_PROVIDER_SITE_OTHER): Admitting: Clinical

## 2024-08-24 ENCOUNTER — Encounter (HOSPITAL_COMMUNITY): Payer: Self-pay | Admitting: Clinical

## 2024-08-24 DIAGNOSIS — F1010 Alcohol abuse, uncomplicated: Secondary | ICD-10-CM | POA: Diagnosis not present

## 2024-08-24 DIAGNOSIS — F1210 Cannabis abuse, uncomplicated: Secondary | ICD-10-CM

## 2024-08-24 DIAGNOSIS — F331 Major depressive disorder, recurrent, moderate: Secondary | ICD-10-CM | POA: Diagnosis not present

## 2024-08-24 DIAGNOSIS — F4310 Post-traumatic stress disorder, unspecified: Secondary | ICD-10-CM | POA: Diagnosis not present

## 2024-08-24 NOTE — Progress Notes (Signed)
 THERAPIST PROGRESS NOTE  Session Time: 9:05am-10:01am  Session #17  Participation Level: Active  Virtual Visit via Video Note  I connected with Daniel Marsh on 08/24/24 at  9:00 AM EDT by a video enabled telemedicine application and verified that I am speaking with the correct person using two identifiers.  Location: Patient: home Provider: [facility] outpatient therapy office - [place]    I discussed the limitations of evaluation and management by telemedicine and the availability of in person appointments. The patient expressed understanding and agreed to proceed.   I discussed the assessment and treatment plan with the patient. The patient was provided an opportunity to ask questions and all were answered. The patient agreed with the plan and demonstrated an understanding of the instructions.   The patient was advised to call back or seek an in-person evaluation if the symptoms worsen or if the condition fails to improve as anticipated.  I provided 56 minutes of non-face-to-face time during this encounter.  [name], LCSW   Behavioral Response: Casual Alert Anxious and Euthymic  Type of Therapy: Individual Therapy  Treatment Goals addressed:  LTG: Explore Stages of Change and ambivalence about stopping the use of alcohol and marijuana  STG: CJ will explore personal core beliefs, rules and assumptions, and cognitive distortions through therapist using Cognitive Behavioral Therapy; learn how to develop replacement thoughts and challenge unhelpful thoughts.  STG:  Demonstrates progress in stages of grief at own pace  LTG: Elimination of maladaptive behaviors and thinking patterns which interfere with resolution of trauma as evidenced by self-report of fewer crying spells and depressed days  LTG: Learn practical breathing/grounding/mindfulness techniques; demonstrate mastery in session then report independent use of these skills out of session.  STG: Report a  decrease in PTSD symptoms as evidenced by a 50% reduction in overall score on a clinician administered PTSD assessment screen/scale  LTG: Score less than 9 on the PHQ-9 and less than 5 on the GAD-7 as evidenced by intermittent administration of the questionnaires to determine progress in managing depression and anxiety.  LTG: Learn and practice communication techniques such as active listening, I statements, open-ended questions, reflective listening, assertiveness, fair fighting rules, initiating conversations, and more as necessary and taught in session.  LTG: Explore and resolve issues relating to history of abuse/neglect/trauma victimization that have contributed to presentation of anxiety, hypervigilance, rage, and other symptoms.  STG: Learn emotion regulation strategies, distress tolerance skills, interpersonal effectiveness techniques, and mindfulness practices and use them in session and in life situations to improve results and satisfaction.  STG: Identify personal recovery goals, assisted by making lists of triggers, warning signs, risk factors, and coping skills.  LTG:  Learn about what forgiveness is and is not, then go through the four phases of forgiveness (uncovering, decision, work, deepening).   ProgressTowards Goals: Progressing  Interventions: Conservator, museum/gallery, Supportive, and Other: trauma-focused work   Summary: Daniel Marsh is a 43 y.o. male who presents with depression and PTSD for therapy.  He presented oriented x5 and stated he was feeling good, I'm here.  [name] evaluated patient's medication compliance, use of coping tools, and self-care, as applicable.  He provided an update on various aspects of his life that are normally discussed in therapy, including his increasing paranoia because of things going on throughout the country, progress with boundaries with mother, avoidance of social media and news, love of roses, progress with letter he is writing to address  some of his trauma, newfound physical problem, and the status of  his ongoing use of alcohol and marijuana.  Although he is in fact becoming more anxious in public settings, nonetheless he was able to accomplish something at the [facility] in [place] during a recent visit that he could not do during the previous visit, and he was proud of this growth.  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  He did, however, have a serious meltdown last week, going down a 9/11 rabbit hole of conspiracy theories.  He was able to provide to therapist a list of his triggers to avoid in the future, including anything involved with the government and conspiracy, which really makes him just stay off social media.  Therapist gave him positive strokes for insight and planning.  He disclosed that he is still trying to drink alcohol only on weekends, is not drinking as much.  However, he does find himself using marijuana to calm down 2-3 times a day and smokes approximately 4 nicotine cigarettes a day for the same reason.  He feels somewhat concerned about this need and wants to think more about it.  He reported that an MRI revealed his spine is progressively worsening with new bulging disks and compressions.  [name] helped him to reframe his thoughts to include some gratitude for now knowing what is going on and possible treatments.    Suicidal/Homicidal: No without intent/plan  Therapist Response:  Patient is progressing AEB engaging in scheduled therapy session.  Throughout the session, [name] gave patient the opportunity to explore thoughts and feelings associated with current life situations and past/present stressors.   [name] challenged patient gently and appropriately to consider different ways of looking at reported issues. [name] encouraged  patient's expression of feelings and validated these using empathy, active listening, open body language, and unconditional positive regard.   Additional appointments were scheduled.     Plan/Recommendations:  Return to therapy in 2 weeks to next scheduled appointment on 10/7, reflect on what was discussed in session, engage in self care behaviors as explored in session, do homework as assigned (considering reasons to cut down on his THC and drinking, continue writing letter about all the people he lost due to policy decisions at the highest level despite whether he intends to send the letter or not, continue boundaries with mother), and return to next session prepared to talk about experience with new coping methods.   Diagnosis:  PTSD (post-traumatic stress disorder)  MDD (major depressive disorder), recurrent episode, moderate (HCC)  Cannabis use disorder, mild, abuse  Mild alcohol use disorder  Collaboration of Care: Psychiatrist AEB - therapist can read psychiatrist notes;  therapy notes are available to psychiatrist in Epic  Patient/Guardian was advised Release of Information must be obtained prior to any record release in order to collaborate their care with an outside provider. Patient/Guardian was advised if they have not already done so to contact the registration department to sign all necessary forms in order for us  to release information regarding their care.   Consent: Patient/Guardian gives verbal consent for treatment and assignment of benefits for services provided during this visit. Patient/Guardian expressed understanding and agreed to proceed.   [name], LCSW 08/24/2024

## 2024-09-06 ENCOUNTER — Encounter (HOSPITAL_COMMUNITY): Payer: Self-pay | Admitting: Clinical

## 2024-09-06 ENCOUNTER — Ambulatory Visit (HOSPITAL_COMMUNITY): Admitting: Clinical

## 2024-09-06 DIAGNOSIS — F331 Major depressive disorder, recurrent, moderate: Secondary | ICD-10-CM | POA: Diagnosis not present

## 2024-09-06 DIAGNOSIS — F1210 Cannabis abuse, uncomplicated: Secondary | ICD-10-CM | POA: Diagnosis not present

## 2024-09-06 DIAGNOSIS — F4310 Post-traumatic stress disorder, unspecified: Secondary | ICD-10-CM | POA: Diagnosis not present

## 2024-09-06 DIAGNOSIS — F1010 Alcohol abuse, uncomplicated: Secondary | ICD-10-CM
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
## 2024-09-09 ENCOUNTER — Ambulatory Visit (INDEPENDENT_AMBULATORY_CARE_PROVIDER_SITE_OTHER): Admitting: Clinical

## 2024-09-09 ENCOUNTER — Encounter (HOSPITAL_COMMUNITY): Payer: Self-pay | Admitting: Clinical

## 2024-09-09 DIAGNOSIS — F331 Major depressive disorder, recurrent, moderate: Secondary | ICD-10-CM | POA: Diagnosis not present

## 2024-09-09 DIAGNOSIS — F4310 Post-traumatic stress disorder, unspecified: Secondary | ICD-10-CM
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
## 2024-09-22 ENCOUNTER — Other Ambulatory Visit: Payer: Self-pay | Admitting: Physician Assistant

## 2024-09-30 ENCOUNTER — Encounter: Payer: Self-pay | Admitting: Physician Assistant

## 2024-10-06 ENCOUNTER — Ambulatory Visit (INDEPENDENT_AMBULATORY_CARE_PROVIDER_SITE_OTHER): Admitting: Clinical

## 2024-10-06 ENCOUNTER — Encounter (HOSPITAL_COMMUNITY): Payer: Self-pay | Admitting: Clinical

## 2024-10-06 DIAGNOSIS — F4310 Post-traumatic stress disorder, unspecified: Secondary | ICD-10-CM

## 2024-10-06 DIAGNOSIS — F331 Major depressive disorder, recurrent, moderate: Secondary | ICD-10-CM
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
## 2024-10-24 ENCOUNTER — Encounter (HOSPITAL_COMMUNITY): Payer: Self-pay | Admitting: Psychiatry

## 2024-10-24 ENCOUNTER — Telehealth (HOSPITAL_COMMUNITY): Admitting: Psychiatry

## 2024-10-24 VITALS — Wt 215.0 lb

## 2024-10-24 DIAGNOSIS — F331 Major depressive disorder, recurrent, moderate: Secondary | ICD-10-CM

## 2024-10-24 DIAGNOSIS — F4310 Post-traumatic stress disorder, unspecified: Secondary | ICD-10-CM

## 2024-10-24 MED ORDER — VORTIOXETINE HBR 20 MG PO TABS
20.0000 mg | ORAL_TABLET | Freq: Every day | ORAL | 2 refills | Status: AC
Start: 2024-10-24 — End: ?

## 2024-10-24 MED ORDER — AMITRIPTYLINE HCL 150 MG PO TABS: 150.0000 mg | ORAL_TABLET | Freq: Every day | ORAL | 2 refills | Status: AC
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
## 2024-10-27 ENCOUNTER — Ambulatory Visit (HOSPITAL_COMMUNITY): Admitting: Clinical

## 2024-11-11 ENCOUNTER — Encounter (HOSPITAL_COMMUNITY): Payer: Self-pay | Admitting: Clinical

## 2024-11-11 ENCOUNTER — Ambulatory Visit (INDEPENDENT_AMBULATORY_CARE_PROVIDER_SITE_OTHER): Admitting: Clinical

## 2024-11-11 DIAGNOSIS — F331 Major depressive disorder, recurrent, moderate: Secondary | ICD-10-CM

## 2024-11-11 DIAGNOSIS — F1010 Alcohol abuse, uncomplicated: Secondary | ICD-10-CM

## 2024-11-11 DIAGNOSIS — F1210 Cannabis abuse, uncomplicated: Secondary | ICD-10-CM

## 2024-11-11 DIAGNOSIS — F4310 Post-traumatic stress disorder, unspecified: Secondary | ICD-10-CM
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
## 2024-12-02 ENCOUNTER — Encounter (HOSPITAL_COMMUNITY): Payer: Self-pay | Admitting: Clinical

## 2024-12-02 ENCOUNTER — Ambulatory Visit (INDEPENDENT_AMBULATORY_CARE_PROVIDER_SITE_OTHER): Admitting: Clinical

## 2024-12-02 DIAGNOSIS — F331 Major depressive disorder, recurrent, moderate: Secondary | ICD-10-CM

## 2024-12-02 DIAGNOSIS — F1010 Alcohol abuse, uncomplicated: Secondary | ICD-10-CM | POA: Diagnosis not present

## 2024-12-02 DIAGNOSIS — F1210 Cannabis abuse, uncomplicated: Secondary | ICD-10-CM

## 2024-12-02 DIAGNOSIS — F4310 Post-traumatic stress disorder, unspecified: Secondary | ICD-10-CM | POA: Diagnosis not present
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
## 2024-12-13 ENCOUNTER — Ambulatory Visit (INDEPENDENT_AMBULATORY_CARE_PROVIDER_SITE_OTHER): Admitting: Clinical

## 2024-12-13 ENCOUNTER — Encounter (HOSPITAL_COMMUNITY): Payer: Self-pay | Admitting: Clinical

## 2024-12-13 DIAGNOSIS — F1210 Cannabis abuse, uncomplicated: Secondary | ICD-10-CM | POA: Diagnosis not present

## 2024-12-13 DIAGNOSIS — F4310 Post-traumatic stress disorder, unspecified: Secondary | ICD-10-CM

## 2024-12-13 DIAGNOSIS — F331 Major depressive disorder, recurrent, moderate: Secondary | ICD-10-CM | POA: Diagnosis not present

## 2024-12-13 DIAGNOSIS — F1010 Alcohol abuse, uncomplicated: Secondary | ICD-10-CM

## 2024-12-13 NOTE — Progress Notes (Signed)
 THERAPIST PROGRESS NOTE  Session Time:  9:05am-10:04am  Session #23  Participation Level: Active  Virtual Visit via Video Note  I connected with Daniel Marsh on 12/13/2024 at  9:00 AM EST by a video enabled telemedicine application and verified that I am speaking with the correct person using two identifiers.  Location: Patient: home Provider: home office   I discussed the limitations of evaluation and management by telemedicine and the availability of in person appointments. The patient expressed understanding and agreed to proceed.   I discussed the assessment and treatment plan with the patient. The patient was provided an opportunity to ask questions and all were answered. The patient agreed with the plan and demonstrated an understanding of the instructions.   The patient was advised to call back or seek an in-person evaluation if the symptoms worsen or if the condition fails to improve as anticipated.  I provided 59 minutes of non-face-to-face time during this encounter.  Daniel JINNY Crest, LCSW   Behavioral Response: Casual Alert Depressed   Type of Therapy: Individual Therapy  Treatment Goals addressed:  LTG: Explore Stages of Change and ambivalence about stopping the use of alcohol and marijuana  STG: CJ will explore personal core beliefs, rules and assumptions, and cognitive distortions through therapist using Cognitive Behavioral Therapy; learn how to develop replacement thoughts and challenge unhelpful thoughts.  STG:  Demonstrates progress in stages of grief at own pace  LTG: Elimination of maladaptive behaviors and thinking patterns which interfere with resolution of trauma as evidenced by self-report of fewer crying spells and depressed days  LTG: Learn practical breathing/grounding/mindfulness techniques; demonstrate mastery in session then report independent use of these skills out of session.  STG: Report a decrease in PTSD symptoms as evidenced by a  50% reduction in overall score on a clinician administered PTSD assessment screen/scale  LTG: Score less than 9 on the PHQ-9 and less than 5 on the GAD-7 as evidenced by intermittent administration of the questionnaires to determine progress in managing depression and anxiety.  LTG: Learn and practice communication techniques such as active listening, I statements, open-ended questions, reflective listening, assertiveness, fair fighting rules, initiating conversations, and more as necessary and taught in session.  LTG: Explore and resolve issues relating to history of abuse/neglect/trauma victimization that have contributed to presentation of anxiety, hypervigilance, rage, and other symptoms.  STG: Learn emotion regulation strategies, distress tolerance skills, interpersonal effectiveness techniques, and mindfulness practices and use them in session and in life situations to improve results and satisfaction.  STG: Identify personal recovery goals, assisted by making lists of triggers, warning signs, risk factors, and coping skills.  LTG:  Learn about what forgiveness is and is not, then go through the four phases of forgiveness (uncovering, decision, work, deepening).   ProgressTowards Goals: Progressing  Interventions: Supportive and Other: grief therapy   Summary: Daniel Marsh is a 44 y.o. male who presents with depression and PTSD for therapy.  Patient stated that the past week has not been good and reported he is not feeling the greatest. Patient cried profusely throughout nearly the entire session, indicating significant emotional distress. He shared that his older brother is serving a life sentence in prison and that his brothers best friend died suddenly of a heart attack at the gym last week. Patient is struggling with whether to write a letter to his incarcerated brother to express his feelings and whether to attend the funeral to support the family. He reported this is the first  time he  has felt empathy toward his brother.  Patient also expressed intense worry, anger, and sadness related to his 70 year old father, who has reportedly concealed a COPD diagnosis from the family for the past seven years, as well as a recent finding of a spot on his lungs. Patient described anger that his father continued smoking and did not disclose his illness, stating this removed the familys ability to make informed choices, such as avoiding smoking around him. Patient identified his role as his fathers Health Care Power of Attorney and expressed feeling entitled to this information. He reported further distress upon learning that his father confided only in his sister from his family of origin rather than his immediate family. Patient acknowledged never previously considering the possibility of his fathers death and processed fear related to potential loss, particularly regarding the close bond between his father and patients son.  [name] explored differing perspectives between patient and father and validated the complexity of patients emotions. Patient identified a desire to speak directly with his father, expressing hope that his father will come to him with concerns about health and future wishes. [name] encouraged journaling to clarify key points prior to this conversation and discussed the importance of having the discussion in person. Patient was asked to reflect on a picture behind him depicting African American men symbolizing peace, power, respect, dignity, and love, and was encouraged to draw on these qualities when communicating with his father.  [name] noted that despite these significant stressors, patient did not mention his typically prominent anxiety symptoms. Patient reported isolating himself and avoiding public settings since receiving news of the death and his fathers illness. [name] explored patients beliefs about death and what may come after; patient expressed comfort in his belief  that this is not all there is.  Suicidal/Homicidal: No without intent/plan  Therapist Response:  Patient is progressing AEB engaging in scheduled therapy session.  Throughout the session, [name] gave patient the opportunity to explore thoughts and feelings associated with current life situations and past/present stressors.   [name] challenged patient gently and appropriately to consider different ways of looking at reported issues. [name] encouraged patients expression of feelings and validated these using empathy, active listening, open body language, and unconditional positive regard.   Patient presents with acute grief, anticipatory grief, and heightened emotional dysregulation related to multiple simultaneous stressors, including sudden death, family illness, and complicated family relationships. Affect was tearful and congruent with content. Insight was intact, and patient demonstrated capacity for reflection and empathy, including newly emerging empathy toward his incarcerated brother. Avoidance and isolation are noted coping responses. No safety concerns were reported or observed during session.     Plan/Recommendations:  Return to therapy in 3 weeks to next scheduled appointment on 1/2, reflect on what was discussed in session, engage in self care behaviors as explored in session, do homework as assigned (journal about what wants to say to father to gather thoughts), and return to next session prepared to talk about experience with new coping methods.   Continue therapy to support processing of grief, anticipatory loss, and complex family dynamics. Encourage journaling to organize thoughts and emotions prior to speaking with father. Support patient in preparing for an in-person conversation with father using values of peace, respect, dignity, and love. Monitor isolation and anxiety symptoms and explore gradual re-engagement with supportive activities as tolerated. Continue exploration of beliefs about death  and meaning as sources of comfort and resilience.  In future sessions address him working on his anxiety hierarchy (  below), establishing comfort zone challenges, watching Noises in Your Head video series.  SUDS (1-10)  Trigger 10   Going to courthouse to sign paperwork 9   Seeing law enforcement in public 8   Glass breaking anywhere 7-8   Going to any restaurant (has to take Valium  in advance) 7    Seeing law enforcement in his car 4   Going to a grocery store  2   Going clothes shopping unless a large national store Diagnosis:  PTSD (post-traumatic stress disorder)  MDD (major depressive disorder), recurrent episode, moderate (HCC)  Cannabis use disorder, mild, abuse  Mild alcohol use disorder  Collaboration of Care: Psychiatrist AEB - therapist can read psychiatrist notes;  therapy notes are available to psychiatrist in Epic  Patient/Guardian was advised Release of Information must be obtained prior to any record release in order to collaborate their care with an outside provider. Patient/Guardian was advised if they have not already done so to contact the registration department to sign all necessary forms in order for us  to release information regarding their care.   Consent: Patient/Guardian gives verbal consent for treatment and assignment of benefits for services provided during this visit. Patient/Guardian expressed understanding and agreed to proceed.   Daniel JINNY Crest, LCSW 12/13/2024

## 2024-12-23 ENCOUNTER — Other Ambulatory Visit: Payer: Self-pay | Admitting: Physician Assistant

## 2024-12-27 ENCOUNTER — Ambulatory Visit (INDEPENDENT_AMBULATORY_CARE_PROVIDER_SITE_OTHER): Admitting: Clinical

## 2024-12-27 ENCOUNTER — Encounter (HOSPITAL_COMMUNITY): Payer: Self-pay | Admitting: Clinical

## 2024-12-27 DIAGNOSIS — F1010 Alcohol abuse, uncomplicated: Secondary | ICD-10-CM | POA: Diagnosis not present

## 2024-12-27 DIAGNOSIS — F4310 Post-traumatic stress disorder, unspecified: Secondary | ICD-10-CM

## 2024-12-27 DIAGNOSIS — F331 Major depressive disorder, recurrent, moderate: Secondary | ICD-10-CM | POA: Diagnosis not present

## 2024-12-27 DIAGNOSIS — F1210 Cannabis abuse, uncomplicated: Secondary | ICD-10-CM

## 2024-12-27 NOTE — Progress Notes (Signed)
 THERAPIST PROGRESS NOTE  Session Time:  9:03am-9:54am   Session #24  Participation Level: Active  Virtual Visit via Video Note  I connected with Daniel Marsh on 12/27/24 at  9:00 AM EST by a video enabled telemedicine application and verified that I am speaking with the correct person using two identifiers.  Location: Patient: home Provider: home office   I discussed the limitations of evaluation and management by telemedicine and the availability of in person appointments. The patient expressed understanding and agreed to proceed.   I discussed the assessment and treatment plan with the patient. The patient was provided an opportunity to ask questions and all were answered. The patient agreed with the plan and demonstrated an understanding of the instructions.   The patient was advised to call back or seek an in-person evaluation if the symptoms worsen or if the condition fails to improve as anticipated.  I provided 51 minutes of non-face-to-face time during this encounter.  [name], LCSW   Behavioral Response: Casual Alert Depressed, Euthymic, and Labile, Tearful   Type of Therapy: Individual Therapy  Treatment Goals addressed:  New treatment goals established, current goals reviewed:  LTG: Explore Stages of Change and ambivalence about stopping the use of alcohol and marijuana  STG: CJ will explore personal core beliefs, rules and assumptions, and cognitive distortions through therapist using Cognitive Behavioral Therapy; learn how to develop replacement thoughts and challenge unhelpful thoughts. STG:  Demonstrates progress in stages of grief at own pace  LTG: Elimination of maladaptive behaviors and thinking patterns which interfere with resolution of trauma as evidenced by self-report of fewer crying spells and depressed days  LTG: Learn practical breathing/grounding/mindfulness techniques; demonstrate mastery in session then report independent use of  these skills out of session.  LTG: Score less than 9 on the PHQ-9 and less than 5 on the GAD-7 as evidenced by intermittent administration of the questionnaires to determine progress in managing depression and anxiety.  LTG: Learn and practice communication techniques such as active listening, I statements, open-ended questions, reflective listening, assertiveness, fair fighting rules, initiating conversations, and more as necessary and taught in session.  LTG: Explore and resolve issues relating to history of abuse/neglect/trauma victimization that have contributed to presentation of anxiety, hypervigilance, rage, and other symptoms.  STG: Learn emotion regulation strategies, distress tolerance skills, interpersonal effectiveness techniques, and mindfulness practices and use them in session and in life situations to improve results and satisfaction. LTG:  Learn about what forgiveness is and is not, then go through the four phases of forgiveness (uncovering, decision, work, deepening).   Initial (2)Toggle Section Visibility LTG: Learn 10+ coping skills from CBT, Stages of Change, DBT, shame resilience theory, ACT, SFBT, MI, trauma-informed therapy, ERP, IFS, and forgiveness curriculum to be able to manage mental health symptoms, AEB home practice and reporting back.  LTG: Participate in Exposure Response Prevention activities designed to eliminate anxieties & calm amygdala; create a hierarchy of social anxiety, perform at least 1-2 Comfort Zone Challenges weekly, keep a written record, and report results.    ProgressTowards Goals: Progressing  Interventions: CBT, Supportive, Reframing, and Other: goal-setting, dialectics   Summary: Daniel Marsh is a 44 y.o. male who presents with depression and PTSD for therapy.  Patient initially reported he was good, though he cried throughout the entire session. He discussed his decision not to attend the funeral of his brothers best friend, stating  that attending would be detrimental to his mental health and that he would have been going  solely out of obligation. He acknowledged feeling some guilt about this choice but was able to resolve it by recognizing that the alternative would have resulted in prolonged emotional distress. Patient demonstrated increasing emotional detachment regarding his fathers health problems, stating he is beginning to understand his powerlessness over his fathers choices. He described handling a recent stressor--his car breaking down just before he was scheduled to pick up his young son from school--without entering panic, noting that in the past this would have triggered immediate panic. He verbalized growing awareness that he is actively using coping tools learned in therapy.  [name] introduced the concept of zooming in versus zooming out on problems, explaining how zooming out can provide a broader perspective and clarify the shared roles and responsibilities of others involved. After discussion, patient verbalized understanding. Patient shared emotional distress related to his oldest son recently turning 16 and efforts to avoid viewing himself as a failure for not being present. Dialectical thinking was introduced, emphasizing the use of AND rather than BUT, and this framework was reinforced throughout the remainder of the session.  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 He recognized that this has led to minimizing his accomplishments, including medals currently held by his mother, and considered retrieving and displaying them. [name] encouraged this as a therapeutic exercise and suggested journaling about what he called his box of accolades. Patient expressed ambivalence, stating, I dont know if Ive punished myself enough, to which [name] provided reality-based reframing that consequences have  already been imposed. Patient discussed suicides of friends following his incarceration and longstanding feelings of responsibility, which were explored through discussion of power versus powerlessness. Patient was able to verbalize that he had no control over those events and is not responsible for any deaths. He expressed gratitude for his resilience and acknowledged the beginning stages of healing.  Suicidal/Homicidal: No without intent/plan  Therapist Response:  Patient continues to process grief, guilt, and self-blame but is demonstrating increased insight, emotional regulation, and use of coping strategies. Affect was tearful but appropriate. Patient shows growing capacity for boundary-setting, cognitive flexibility, and dialectical thinking. Panic symptoms appear reduced, with improved stress tolerance. No suicidal ideation reported.     Plan/Recommendations:  Return to therapy in 3 weeks to next scheduled appointment on 1/2, reflect on what was discussed in session, engage in self care behaviors as explored in session, do homework as assigned (journal about medals, use dialectical language), and return to next session prepared to talk about experience with new coping methods.   Continue therapy to support processing of grief, anticipatory loss, and complex family dynamics. Encourage journaling to organize thoughts and emotions prior to speaking with father. Support patient in preparing for an in-person conversation with father using values of peace, respect, dignity, and love. Monitor isolation and anxiety symptoms and explore gradual re-engagement with supportive activities as tolerated. Continue exploration of beliefs about death and meaning as sources of comfort and resilience.    In future sessions address him working on his anxiety hierarchy (below), establishing comfort zone challenges, watching Noises in Your Head video series.  SUDS (1-10)  Trigger 10   Going to courthouse to sign  paperwork 9   Seeing law enforcement in public 8   Glass breaking anywhere 7-8   Going to any restaurant (has to take Valium  in advance) 7    Seeing law enforcement in his car 4  Going to a grocery store  2   Going clothes shopping unless a large national store Diagnosis:  PTSD (post-traumatic stress disorder)  MDD (major depressive disorder), recurrent episode, moderate (HCC)  Cannabis use disorder, mild, abuse  Mild alcohol use disorder  Collaboration of Care: Psychiatrist AEB - therapist can read psychiatrist notes;  therapy notes are available to psychiatrist in Epic  Patient/Guardian was advised Release of Information must be obtained prior to any record release in order to collaborate their care with an outside provider. Patient/Guardian was advised if they have not already done so to contact the registration department to sign all necessary forms in order for us  to release information regarding their care.   Consent: Patient/Guardian gives verbal consent for treatment and assignment of benefits for services provided during this visit. Patient/Guardian expressed understanding and agreed to proceed.   [name], LCSW 12/27/2024

## 2025-01-17 ENCOUNTER — Ambulatory Visit (HOSPITAL_COMMUNITY): Admitting: Clinical

## 2025-02-07 ENCOUNTER — Ambulatory Visit (HOSPITAL_COMMUNITY): Admitting: Clinical

## 2025-06-26 ENCOUNTER — Encounter: Admitting: Physician Assistant

## 2025-06-28 ENCOUNTER — Ambulatory Visit

## 2025-06-29 ENCOUNTER — Ambulatory Visit
